# Patient Record
Sex: Male | Born: 1955 | Race: White | Hispanic: No | Marital: Single | Smoking: Current every day smoker
Health system: Southern US, Community
[De-identification: ages and names within clinical notes are randomized; demographics above are authoritative.]

## PROBLEM LIST (undated history)

## (undated) DIAGNOSIS — Z9889 Other specified postprocedural states: Secondary | ICD-10-CM

## (undated) DIAGNOSIS — J449 Chronic obstructive pulmonary disease, unspecified: Secondary | ICD-10-CM

## (undated) DIAGNOSIS — F32A Depression, unspecified: Secondary | ICD-10-CM

## (undated) DIAGNOSIS — I255 Ischemic cardiomyopathy: Secondary | ICD-10-CM

## (undated) DIAGNOSIS — R011 Cardiac murmur, unspecified: Secondary | ICD-10-CM

## (undated) DIAGNOSIS — I5042 Chronic combined systolic (congestive) and diastolic (congestive) heart failure: Secondary | ICD-10-CM

## (undated) DIAGNOSIS — I10 Essential (primary) hypertension: Secondary | ICD-10-CM

## (undated) DIAGNOSIS — Z951 Presence of aortocoronary bypass graft: Secondary | ICD-10-CM

## (undated) DIAGNOSIS — E1151 Type 2 diabetes mellitus with diabetic peripheral angiopathy without gangrene: Secondary | ICD-10-CM

## (undated) DIAGNOSIS — D649 Anemia, unspecified: Secondary | ICD-10-CM

## (undated) DIAGNOSIS — I2510 Atherosclerotic heart disease of native coronary artery without angina pectoris: Secondary | ICD-10-CM

## (undated) DIAGNOSIS — Z87442 Personal history of urinary calculi: Secondary | ICD-10-CM

## (undated) DIAGNOSIS — C801 Malignant (primary) neoplasm, unspecified: Secondary | ICD-10-CM

## (undated) DIAGNOSIS — F17200 Nicotine dependence, unspecified, uncomplicated: Secondary | ICD-10-CM

## (undated) DIAGNOSIS — Z9861 Coronary angioplasty status: Secondary | ICD-10-CM

## (undated) DIAGNOSIS — K219 Gastro-esophageal reflux disease without esophagitis: Secondary | ICD-10-CM

## (undated) DIAGNOSIS — I779 Disorder of arteries and arterioles, unspecified: Secondary | ICD-10-CM

## (undated) DIAGNOSIS — I509 Heart failure, unspecified: Secondary | ICD-10-CM

## (undated) DIAGNOSIS — E785 Hyperlipidemia, unspecified: Secondary | ICD-10-CM

## (undated) DIAGNOSIS — I214 Non-ST elevation (NSTEMI) myocardial infarction: Secondary | ICD-10-CM

## (undated) HISTORY — PX: CATARACT EXTRACTION W/ INTRAOCULAR LENS IMPLANT: SHX1309

## (undated) HISTORY — DX: Type 2 diabetes mellitus with diabetic peripheral angiopathy without gangrene: E11.51

## (undated) HISTORY — PX: EYE SURGERY: SHX253

## (undated) HISTORY — DX: Presence of aortocoronary bypass graft: Z95.1

## (undated) HISTORY — PX: TONSILLECTOMY: SUR1361

## (undated) HISTORY — PX: TRANSTHORACIC ECHOCARDIOGRAM: SHX275

## (undated) HISTORY — DX: Other specified postprocedural states: Z98.890

## (undated) HISTORY — DX: Atherosclerotic heart disease of native coronary artery without angina pectoris: I25.10

## (undated) HISTORY — DX: Coronary angioplasty status: Z98.61

## (undated) HISTORY — PX: OTHER SURGICAL HISTORY: SHX169

## (undated) HISTORY — DX: Chronic combined systolic (congestive) and diastolic (congestive) heart failure: I50.42

---

## 2000-03-15 DIAGNOSIS — I255 Ischemic cardiomyopathy: Secondary | ICD-10-CM | POA: Diagnosis present

## 2010-02-20 ENCOUNTER — Ambulatory Visit: Payer: Self-pay

## 2010-03-12 ENCOUNTER — Encounter: Payer: Self-pay | Admitting: Emergency Medicine

## 2010-03-12 ENCOUNTER — Inpatient Hospital Stay (HOSPITAL_COMMUNITY)
Admission: AD | Admit: 2010-03-12 | Discharge: 2010-03-30 | Disposition: A | Discharge status: Other Acute Inpatient Hospital | Payer: Self-pay | Source: Home / Self Care | Attending: Internal Medicine | Admitting: Internal Medicine

## 2010-03-12 ENCOUNTER — Ambulatory Visit: Payer: Self-pay | Admitting: Radiology

## 2010-03-15 ENCOUNTER — Inpatient Hospital Stay (HOSPITAL_COMMUNITY)
Admission: AD | Admit: 2010-03-15 | Discharge: 2010-03-30 | Payer: Self-pay | Source: Home / Self Care | Attending: Cardiovascular Disease | Admitting: Cardiovascular Disease

## 2010-03-15 ENCOUNTER — Ambulatory Visit: Payer: Self-pay | Admitting: Cardiothoracic Surgery

## 2010-03-15 DIAGNOSIS — Z9889 Other specified postprocedural states: Secondary | ICD-10-CM | POA: Insufficient documentation

## 2010-03-15 DIAGNOSIS — I214 Non-ST elevation (NSTEMI) myocardial infarction: Secondary | ICD-10-CM | POA: Insufficient documentation

## 2010-03-15 DIAGNOSIS — Z951 Presence of aortocoronary bypass graft: Secondary | ICD-10-CM

## 2010-03-15 DIAGNOSIS — F17200 Nicotine dependence, unspecified, uncomplicated: Secondary | ICD-10-CM | POA: Diagnosis present

## 2010-03-15 DIAGNOSIS — I2510 Atherosclerotic heart disease of native coronary artery without angina pectoris: Secondary | ICD-10-CM | POA: Diagnosis present

## 2010-03-15 DIAGNOSIS — E1169 Type 2 diabetes mellitus with other specified complication: Secondary | ICD-10-CM | POA: Diagnosis present

## 2010-03-15 DIAGNOSIS — I10 Essential (primary) hypertension: Secondary | ICD-10-CM

## 2010-03-15 HISTORY — PX: MITRAL VALVULOPLASTY: SHX143

## 2010-03-15 HISTORY — DX: Essential (primary) hypertension: I10

## 2010-03-15 HISTORY — PX: CORONARY ARTERY BYPASS GRAFT: SHX141

## 2010-03-15 HISTORY — DX: Non-ST elevation (NSTEMI) myocardial infarction: I21.4

## 2010-03-15 HISTORY — DX: Presence of aortocoronary bypass graft: Z95.1

## 2010-03-15 HISTORY — DX: Other specified postprocedural states: Z98.890

## 2010-03-16 ENCOUNTER — Encounter: Payer: Self-pay | Admitting: Cardiothoracic Surgery

## 2010-03-16 ENCOUNTER — Encounter (INDEPENDENT_AMBULATORY_CARE_PROVIDER_SITE_OTHER): Payer: Self-pay | Admitting: Cardiology

## 2010-03-23 ENCOUNTER — Encounter: Payer: Self-pay | Admitting: Cardiothoracic Surgery

## 2010-03-23 HISTORY — PX: TRANSTHORACIC ECHOCARDIOGRAM: SHX275

## 2010-04-01 ENCOUNTER — Emergency Department (HOSPITAL_COMMUNITY)
Admission: EM | Admit: 2010-04-01 | Discharge: 2010-04-02 | Payer: Self-pay | Source: Home / Self Care | Admitting: Emergency Medicine

## 2010-04-04 ENCOUNTER — Encounter
Admission: AD | Admit: 2010-04-04 | Discharge: 2010-04-04 | Payer: Self-pay | Source: Home / Self Care | Attending: Dentistry | Admitting: Dentistry

## 2010-04-18 ENCOUNTER — Ambulatory Visit
Admission: RE | Admit: 2010-04-18 | Discharge: 2010-04-18 | Payer: Self-pay | Source: Home / Self Care | Attending: Cardiothoracic Surgery | Admitting: Cardiothoracic Surgery

## 2010-04-18 ENCOUNTER — Encounter
Admission: RE | Admit: 2010-04-18 | Discharge: 2010-04-18 | Payer: Self-pay | Source: Home / Self Care | Attending: Cardiothoracic Surgery | Admitting: Cardiothoracic Surgery

## 2010-04-26 ENCOUNTER — Encounter (HOSPITAL_COMMUNITY)
Admission: RE | Admit: 2010-04-26 | Discharge: 2010-05-15 | Payer: Self-pay | Source: Home / Self Care | Attending: Cardiology | Admitting: Cardiology

## 2010-05-02 LAB — GLUCOSE, CAPILLARY
Glucose-Capillary: 150 mg/dL — ABNORMAL HIGH (ref 70–99)
Glucose-Capillary: 122 mg/dL — ABNORMAL HIGH (ref 70–99)

## 2010-05-07 LAB — GLUCOSE, CAPILLARY
Glucose-Capillary: 123 mg/dL — ABNORMAL HIGH (ref 70–99)
Glucose-Capillary: 109 mg/dL — ABNORMAL HIGH (ref 70–99)
Glucose-Capillary: 138 mg/dL — ABNORMAL HIGH (ref 70–99)

## 2010-05-09 ENCOUNTER — Ambulatory Visit
Admission: RE | Admit: 2010-05-09 | Discharge: 2010-05-09 | Payer: Self-pay | Source: Home / Self Care | Attending: Cardiothoracic Surgery | Admitting: Cardiothoracic Surgery

## 2010-05-09 ENCOUNTER — Encounter
Admission: RE | Admit: 2010-05-09 | Discharge: 2010-05-09 | Payer: Self-pay | Source: Home / Self Care | Attending: Cardiothoracic Surgery | Admitting: Cardiothoracic Surgery

## 2010-05-10 NOTE — Assessment & Plan Note (Signed)
OFFICE VISIT  PASCO, MARCHITTO DOB:  06-Aug-1955                                        May 09, 2010 CHART #:  [id number]  CURRENT PROBLEMS: 1. Status post coronary artery bypass graft x3 and mitral valve     annuloplasty repair on March 23, 2010, for acute myocardial     infarction, ischemic cardiomyopathy, and ischemic mitral     regurgitation. 2. Chronic obstructive pulmonary disease, reformed smoker. 3. Full dental extraction, pending dentures.  PRESENT ILLNESS:  The patient is a 55 year old Caucasian male who returns for further surgical followup after undergoing multivessel bypass grafting combined with mitral valve repair in early December when he presented with an acute MI, ischemic MR, and class IV CHF.  He has done well now as an outpatient and is successfully performing outpatient cardiac rehab at [facility].  He denies recurrent symptoms of angina or CHF and the surgical incisions are healing well.  He has been maintained on Coumadin from the mitral valve repair and has been on Coumadin for over 6 weeks now.  He has not had any postoperative atrial fibrillation and the Coumadin could be stopped at this time.  He remains on Coreg 12.5 mg b.i.d., Zocor 40 mg daily, aspirin 81 mg daily, and p.r.n. Vicodin.  PHYSICAL EXAMINATION:  Vital Signs:  Blood pressure 116/70, pulse 90 and regular, respirations 20, and saturation 98% on room air.  General:  He is alert and comfortable.  He appears to be doing very well.  Lungs: Breath sounds are clear and equal.  The sternum is well healed. Cardiac:  Rhythm is regular and there is no murmur.  Extremities:  The leg incisions are healed and there is no pedal edema.  DIAGNOSTIC TESTS:  A PA and lateral chest x-ray reveals clear lung fields.  No pleural effusion.  Cardiac silhouette is reduced in size and the sternal wires are intact.  IMPRESSION AND PLAN:  The patient done exceptionally well and  is functioning at a high level.  He is not ready to return to work until full 3 months after surgery due to the physical demands of his work.  He can stop the Coumadin now and proceed with having mandibular surgery to fit his mouth for dentures.  I plan on seeing him back in approximately 3 weeks to monitor his progress.  Kerin Perna, M.D. Electronically Signed  PV/MEDQ  D:  05/09/2010  T:  05/10/2010  Job:  [id number]  cc:   Landry Corporal, MD

## 2010-05-16 ENCOUNTER — Encounter (HOSPITAL_COMMUNITY): Payer: BC Managed Care – PPO | Attending: Cardiology

## 2010-05-16 DIAGNOSIS — I2589 Other forms of chronic ischemic heart disease: Secondary | ICD-10-CM | POA: Insufficient documentation

## 2010-05-16 DIAGNOSIS — I079 Rheumatic tricuspid valve disease, unspecified: Secondary | ICD-10-CM | POA: Insufficient documentation

## 2010-05-16 DIAGNOSIS — Z5189 Encounter for other specified aftercare: Secondary | ICD-10-CM | POA: Insufficient documentation

## 2010-05-16 DIAGNOSIS — I10 Essential (primary) hypertension: Secondary | ICD-10-CM | POA: Insufficient documentation

## 2010-05-16 DIAGNOSIS — E039 Hypothyroidism, unspecified: Secondary | ICD-10-CM | POA: Insufficient documentation

## 2010-05-16 DIAGNOSIS — Z951 Presence of aortocoronary bypass graft: Secondary | ICD-10-CM | POA: Insufficient documentation

## 2010-05-16 DIAGNOSIS — I2510 Atherosclerotic heart disease of native coronary artery without angina pectoris: Secondary | ICD-10-CM | POA: Insufficient documentation

## 2010-05-16 DIAGNOSIS — I509 Heart failure, unspecified: Secondary | ICD-10-CM | POA: Insufficient documentation

## 2010-05-16 DIAGNOSIS — E785 Hyperlipidemia, unspecified: Secondary | ICD-10-CM | POA: Insufficient documentation

## 2010-05-16 DIAGNOSIS — Z9889 Other specified postprocedural states: Secondary | ICD-10-CM | POA: Insufficient documentation

## 2010-05-16 DIAGNOSIS — F17200 Nicotine dependence, unspecified, uncomplicated: Secondary | ICD-10-CM | POA: Insufficient documentation

## 2010-05-16 DIAGNOSIS — J441 Chronic obstructive pulmonary disease with (acute) exacerbation: Secondary | ICD-10-CM | POA: Insufficient documentation

## 2010-05-16 DIAGNOSIS — I059 Rheumatic mitral valve disease, unspecified: Secondary | ICD-10-CM | POA: Insufficient documentation

## 2010-05-16 DIAGNOSIS — Z7982 Long term (current) use of aspirin: Secondary | ICD-10-CM | POA: Insufficient documentation

## 2010-05-16 DIAGNOSIS — E119 Type 2 diabetes mellitus without complications: Secondary | ICD-10-CM | POA: Insufficient documentation

## 2010-05-16 DIAGNOSIS — I5022 Chronic systolic (congestive) heart failure: Secondary | ICD-10-CM | POA: Insufficient documentation

## 2010-05-18 ENCOUNTER — Encounter (HOSPITAL_COMMUNITY): Payer: BC Managed Care – PPO

## 2010-05-21 ENCOUNTER — Ambulatory Visit (HOSPITAL_COMMUNITY): Payer: Self-pay | Admitting: Dentistry

## 2010-05-21 ENCOUNTER — Encounter (HOSPITAL_COMMUNITY): Payer: BC Managed Care – PPO

## 2010-05-21 DIAGNOSIS — M899 Disorder of bone, unspecified: Secondary | ICD-10-CM

## 2010-05-21 DIAGNOSIS — K08109 Complete loss of teeth, unspecified cause, unspecified class: Secondary | ICD-10-CM

## 2010-05-21 DIAGNOSIS — M949 Disorder of cartilage, unspecified: Secondary | ICD-10-CM

## 2010-05-23 ENCOUNTER — Encounter (HOSPITAL_COMMUNITY): Payer: BC Managed Care – PPO

## 2010-05-23 ENCOUNTER — Other Ambulatory Visit: Payer: Self-pay

## 2010-05-23 LAB — GLUCOSE, CAPILLARY
Glucose-Capillary: 206 mg/dL — ABNORMAL HIGH (ref 70–99)
Glucose-Capillary: 116 mg/dL — ABNORMAL HIGH (ref 70–99)

## 2010-05-24 ENCOUNTER — Ambulatory Visit (HOSPITAL_COMMUNITY): Payer: BC Managed Care – PPO | Admitting: Dentistry

## 2010-05-24 DIAGNOSIS — M949 Disorder of cartilage, unspecified: Secondary | ICD-10-CM

## 2010-05-24 DIAGNOSIS — K08109 Complete loss of teeth, unspecified cause, unspecified class: Secondary | ICD-10-CM

## 2010-05-24 DIAGNOSIS — M899 Disorder of bone, unspecified: Secondary | ICD-10-CM

## 2010-05-25 ENCOUNTER — Encounter (HOSPITAL_COMMUNITY): Payer: BC Managed Care – PPO

## 2010-05-25 ENCOUNTER — Other Ambulatory Visit: Payer: Self-pay

## 2010-05-25 LAB — GLUCOSE, CAPILLARY: Glucose-Capillary: 118 mg/dL — ABNORMAL HIGH (ref 70–99)

## 2010-05-28 ENCOUNTER — Encounter (HOSPITAL_COMMUNITY): Payer: BC Managed Care – PPO

## 2010-05-28 LAB — GLUCOSE, CAPILLARY: Glucose-Capillary: 220 mg/dL — ABNORMAL HIGH (ref 70–99)

## 2010-05-30 ENCOUNTER — Ambulatory Visit (INDEPENDENT_AMBULATORY_CARE_PROVIDER_SITE_OTHER): Payer: BC Managed Care – PPO | Admitting: Cardiothoracic Surgery

## 2010-05-30 ENCOUNTER — Encounter (HOSPITAL_COMMUNITY): Payer: BC Managed Care – PPO

## 2010-05-30 DIAGNOSIS — I2510 Atherosclerotic heart disease of native coronary artery without angina pectoris: Secondary | ICD-10-CM

## 2010-05-30 DIAGNOSIS — Z0279 Encounter for issue of other medical certificate: Secondary | ICD-10-CM

## 2010-05-31 ENCOUNTER — Ambulatory Visit (HOSPITAL_COMMUNITY): Payer: BC Managed Care – PPO | Admitting: Dentistry

## 2010-05-31 DIAGNOSIS — K08109 Complete loss of teeth, unspecified cause, unspecified class: Secondary | ICD-10-CM

## 2010-05-31 NOTE — Assessment & Plan Note (Signed)
OFFICE VISIT  Eric Bradshaw, Eric Bradshaw DOB:  July 06, 1955                                        May 31, 2010 CHART #:  [id number]  CURRENT PROBLEMS: 1. Status post CABG x3 with mitral valve repair, March 23, 2010 for     acute MI, ischemic cardiomyopathy, and mitral regurgitation. 2. Chronic obstructive pulmonary disease, reformed smoker.  PRESENT ILLNESS:  The patient is a 55 year old Caucasian male who returns for his final surgical followup 2 months after combined multivessel bypass grafting and mitral valve repair.  He is now doing well without angina or CHF.  He is off his Coumadin as he is maintained a sinus rhythm.  He is planning on getting dentures fit and resuming his work schedule in March 2012.  PHYSICAL EXAMINATION:  Blood pressure 110/80, pulse 98, saturation 92% on room air.  He is alert and appears healthy.  Breath sounds are clear. Cardiac:  Rhythm is regular without murmur or gallop.  There is no peripheral edema.  IMPRESSION:  The patient is now almost fully recovered.  He will return to his cardiologist and primary physicians and return here as needed.  Kerin Perna, M.D. Electronically Signed  PV/MEDQ  D:  05/31/2010  T:  05/31/2010  Job:  [id number]  cc:   Ritta Slot, MD

## 2010-06-01 ENCOUNTER — Encounter (HOSPITAL_COMMUNITY): Payer: BC Managed Care – PPO

## 2010-06-04 ENCOUNTER — Encounter (HOSPITAL_COMMUNITY): Payer: BC Managed Care – PPO

## 2010-06-06 ENCOUNTER — Encounter (HOSPITAL_COMMUNITY): Payer: BC Managed Care – PPO

## 2010-06-08 ENCOUNTER — Encounter (HOSPITAL_COMMUNITY): Payer: BC Managed Care – PPO

## 2010-06-11 ENCOUNTER — Encounter (HOSPITAL_COMMUNITY): Payer: BC Managed Care – PPO

## 2010-06-13 ENCOUNTER — Encounter (HOSPITAL_COMMUNITY): Payer: BC Managed Care – PPO

## 2010-06-15 ENCOUNTER — Encounter (HOSPITAL_COMMUNITY): Payer: BC Managed Care – PPO | Attending: Cardiology

## 2010-06-15 DIAGNOSIS — Z5189 Encounter for other specified aftercare: Secondary | ICD-10-CM | POA: Insufficient documentation

## 2010-06-15 DIAGNOSIS — Z7982 Long term (current) use of aspirin: Secondary | ICD-10-CM | POA: Insufficient documentation

## 2010-06-15 DIAGNOSIS — F17200 Nicotine dependence, unspecified, uncomplicated: Secondary | ICD-10-CM | POA: Insufficient documentation

## 2010-06-15 DIAGNOSIS — I2510 Atherosclerotic heart disease of native coronary artery without angina pectoris: Secondary | ICD-10-CM | POA: Insufficient documentation

## 2010-06-15 DIAGNOSIS — E119 Type 2 diabetes mellitus without complications: Secondary | ICD-10-CM | POA: Insufficient documentation

## 2010-06-15 DIAGNOSIS — I10 Essential (primary) hypertension: Secondary | ICD-10-CM | POA: Insufficient documentation

## 2010-06-15 DIAGNOSIS — I509 Heart failure, unspecified: Secondary | ICD-10-CM | POA: Insufficient documentation

## 2010-06-15 DIAGNOSIS — I5022 Chronic systolic (congestive) heart failure: Secondary | ICD-10-CM | POA: Insufficient documentation

## 2010-06-15 DIAGNOSIS — I2589 Other forms of chronic ischemic heart disease: Secondary | ICD-10-CM | POA: Insufficient documentation

## 2010-06-15 DIAGNOSIS — E039 Hypothyroidism, unspecified: Secondary | ICD-10-CM | POA: Insufficient documentation

## 2010-06-15 DIAGNOSIS — I079 Rheumatic tricuspid valve disease, unspecified: Secondary | ICD-10-CM | POA: Insufficient documentation

## 2010-06-15 DIAGNOSIS — J441 Chronic obstructive pulmonary disease with (acute) exacerbation: Secondary | ICD-10-CM | POA: Insufficient documentation

## 2010-06-15 DIAGNOSIS — E785 Hyperlipidemia, unspecified: Secondary | ICD-10-CM | POA: Insufficient documentation

## 2010-06-15 DIAGNOSIS — I059 Rheumatic mitral valve disease, unspecified: Secondary | ICD-10-CM | POA: Insufficient documentation

## 2010-06-15 DIAGNOSIS — Z951 Presence of aortocoronary bypass graft: Secondary | ICD-10-CM | POA: Insufficient documentation

## 2010-06-15 DIAGNOSIS — Z9889 Other specified postprocedural states: Secondary | ICD-10-CM | POA: Insufficient documentation

## 2010-06-18 ENCOUNTER — Encounter (HOSPITAL_COMMUNITY): Payer: BC Managed Care – PPO

## 2010-06-20 ENCOUNTER — Encounter (HOSPITAL_COMMUNITY): Payer: BC Managed Care – PPO

## 2010-06-22 ENCOUNTER — Encounter (HOSPITAL_COMMUNITY): Payer: BC Managed Care – PPO

## 2010-06-25 ENCOUNTER — Encounter (HOSPITAL_COMMUNITY): Payer: BC Managed Care – PPO

## 2010-06-25 LAB — CBC
HCT: 23.3 % — ABNORMAL LOW (ref 39.0–52.0)
HCT: 26.2 % — ABNORMAL LOW (ref 39.0–52.0)
Hemoglobin: 7.8 g/dL — ABNORMAL LOW (ref 13.0–17.0)
Hemoglobin: 8.8 g/dL — ABNORMAL LOW (ref 13.0–17.0)
Hemoglobin: 9.4 g/dL — ABNORMAL LOW (ref 13.0–17.0)
MCH: 29.5 pg (ref 26.0–34.0)
MCH: 29.6 pg (ref 26.0–34.0)
MCHC: 33.5 g/dL (ref 30.0–36.0)
MCHC: 33.6 g/dL (ref 30.0–36.0)
MCHC: 33.8 g/dL (ref 30.0–36.0)
MCV: 88.2 fL (ref 78.0–100.0)
MCV: 88.3 fL (ref 78.0–100.0)
Platelets: 229 10*3/uL (ref 150–400)
Platelets: 267 10*3/uL (ref 150–400)
RBC: 2.64 MIL/uL — ABNORMAL LOW (ref 4.22–5.81)
RBC: 2.97 MIL/uL — ABNORMAL LOW (ref 4.22–5.81)
RBC: 3.13 MIL/uL — ABNORMAL LOW (ref 4.22–5.81)
RDW: 12.7 % (ref 11.5–15.5)
RDW: 12.8 % (ref 11.5–15.5)
WBC: 10.2 10*3/uL (ref 4.0–10.5)
WBC: 8.9 10*3/uL (ref 4.0–10.5)
WBC: 9 10*3/uL (ref 4.0–10.5)

## 2010-06-25 LAB — CROSSMATCH
ABO/RH(D): O NEG
Antibody Screen: NEGATIVE
Unit division: 0

## 2010-06-25 LAB — GLUCOSE, CAPILLARY
Glucose-Capillary: 100 mg/dL — ABNORMAL HIGH (ref 70–99)
Glucose-Capillary: 123 mg/dL — ABNORMAL HIGH (ref 70–99)
Glucose-Capillary: 93 mg/dL (ref 70–99)

## 2010-06-25 LAB — BASIC METABOLIC PANEL
BUN: 23 mg/dL (ref 6–23)
CO2: 27 mEq/L (ref 19–32)
Calcium: 8.6 mg/dL (ref 8.4–10.5)
Chloride: 98 mEq/L (ref 96–112)
Creatinine, Ser: 0.78 mg/dL (ref 0.4–1.5)
GFR calc Af Amer: >60 mL/min (ref >60)
GFR calc non Af Amer: >60 mL/min (ref >60)
Glucose, Bld: 83 mg/dL (ref 70–99)
Potassium: 3.9 mEq/L (ref 3.5–5.1)
Sodium: 133 mEq/L — ABNORMAL LOW (ref 135–145)

## 2010-06-25 LAB — PROTIME-INR
INR: 2.03 — ABNORMAL HIGH (ref 0.00–1.49)
INR: 2.32 — ABNORMAL HIGH (ref 0.00–1.49)
INR: 2.81 — ABNORMAL HIGH (ref 0.00–1.49)
Prothrombin Time: 23.1 seconds — ABNORMAL HIGH (ref 11.6–15.2)
Prothrombin Time: 25.6 seconds — ABNORMAL HIGH (ref 11.6–15.2)

## 2010-06-25 LAB — BRAIN NATRIURETIC PEPTIDE: Pro B Natriuretic peptide (BNP): 294 pg/mL — ABNORMAL HIGH (ref 0.0–100.0)

## 2010-06-25 LAB — DIFFERENTIAL
Basophils Relative: 0 % (ref 0–1)
Lymphocytes Relative: 20 % (ref 12–46)
Lymphs Abs: 2.1 10*3/uL (ref 0.7–4.0)
Monocytes Relative: 9 % (ref 3–12)
Neutro Abs: 6.7 10*3/uL (ref 1.7–7.7)
Neutrophils Relative %: 65 % (ref 43–77)

## 2010-06-25 LAB — APTT: aPTT: 46 seconds — ABNORMAL HIGH (ref 24–37)

## 2010-06-26 LAB — CBC
HCT: 24 % — ABNORMAL LOW (ref 39.0–52.0)
HCT: 24.5 % — ABNORMAL LOW (ref 39.0–52.0)
HCT: 24.7 % — ABNORMAL LOW (ref 39.0–52.0)
HCT: 24.8 % — ABNORMAL LOW (ref 39.0–52.0)
HCT: 24.9 % — ABNORMAL LOW (ref 39.0–52.0)
HCT: 25.2 % — ABNORMAL LOW (ref 39.0–52.0)
HCT: 25.5 % — ABNORMAL LOW (ref 39.0–52.0)
HCT: 26.5 % — ABNORMAL LOW (ref 39.0–52.0)
HCT: 41.4 % (ref 39.0–52.0)
Hemoglobin: 13.8 g/dL (ref 13.0–17.0)
Hemoglobin: 13.8 g/dL (ref 13.0–17.0)
Hemoglobin: 8.2 g/dL — ABNORMAL LOW (ref 13.0–17.0)
Hemoglobin: 8.4 g/dL — ABNORMAL LOW (ref 13.0–17.0)
Hemoglobin: 8.4 g/dL — ABNORMAL LOW (ref 13.0–17.0)
Hemoglobin: 8.4 g/dL — ABNORMAL LOW (ref 13.0–17.0)
Hemoglobin: 8.5 g/dL — ABNORMAL LOW (ref 13.0–17.0)
Hemoglobin: 8.5 g/dL — ABNORMAL LOW (ref 13.0–17.0)
Hemoglobin: 8.6 g/dL — ABNORMAL LOW (ref 13.0–17.0)
Hemoglobin: 8.8 g/dL — ABNORMAL LOW (ref 13.0–17.0)
MCH: 29.3 pg (ref 26.0–34.0)
MCH: 29.9 pg (ref 26.0–34.0)
MCH: 30.1 pg (ref 26.0–34.0)
MCH: 30.1 pg (ref 26.0–34.0)
MCH: 30.1 pg (ref 26.0–34.0)
MCH: 30.3 pg (ref 26.0–34.0)
MCH: 30.3 pg (ref 26.0–34.0)
MCH: 30.3 pg (ref 26.0–34.0)
MCH: 30.5 pg (ref 26.0–34.0)
MCH: 30.6 pg (ref 26.0–34.0)
MCH: 30.7 pg (ref 26.0–34.0)
MCH: 31.1 pg (ref 26.0–34.0)
MCHC: 33.2 g/dL (ref 30.0–36.0)
MCHC: 33.3 g/dL (ref 30.0–36.0)
MCHC: 33.3 g/dL (ref 30.0–36.0)
MCHC: 33.3 g/dL (ref 30.0–36.0)
MCHC: 33.8 g/dL (ref 30.0–36.0)
MCHC: 33.9 g/dL (ref 30.0–36.0)
MCHC: 34 g/dL (ref 30.0–36.0)
MCHC: 34.2 g/dL (ref 30.0–36.0)
MCHC: 34.5 g/dL (ref 30.0–36.0)
MCHC: 34.5 g/dL (ref 30.0–36.0)
MCHC: 34.5 g/dL (ref 30.0–36.0)
MCHC: 34.7 g/dL (ref 30.0–36.0)
MCV: 87.6 fL (ref 78.0–100.0)
MCV: 88.3 fL (ref 78.0–100.0)
MCV: 88.5 fL (ref 78.0–100.0)
MCV: 88.8 fL (ref 78.0–100.0)
MCV: 88.9 fL (ref 78.0–100.0)
MCV: 89.2 fL (ref 78.0–100.0)
MCV: 89.5 fL (ref 78.0–100.0)
MCV: 89.7 fL (ref 78.0–100.0)
MCV: 90.2 fL (ref 78.0–100.0)
MCV: 90.4 fL (ref 78.0–100.0)
MCV: 91 fL (ref 78.0–100.0)
Platelets: 110 10*3/uL — ABNORMAL LOW (ref 150–400)
Platelets: 122 10*3/uL — ABNORMAL LOW (ref 150–400)
Platelets: 126 10*3/uL — ABNORMAL LOW (ref 150–400)
Platelets: 128 10*3/uL — ABNORMAL LOW (ref 150–400)
Platelets: 150 10*3/uL (ref 150–400)
Platelets: 163 10*3/uL (ref 150–400)
Platelets: 184 10*3/uL (ref 150–400)
Platelets: 205 10*3/uL (ref 150–400)
Platelets: 216 10*3/uL (ref 150–400)
Platelets: 96 10*3/uL — ABNORMAL LOW (ref 150–400)
RBC: 2.73 MIL/uL — ABNORMAL LOW (ref 4.22–5.81)
RBC: 2.74 MIL/uL — ABNORMAL LOW (ref 4.22–5.81)
RBC: 2.77 MIL/uL — ABNORMAL LOW (ref 4.22–5.81)
RBC: 2.77 MIL/uL — ABNORMAL LOW (ref 4.22–5.81)
RBC: 2.77 MIL/uL — ABNORMAL LOW (ref 4.22–5.81)
RBC: 2.8 MIL/uL — ABNORMAL LOW (ref 4.22–5.81)
RBC: 2.82 MIL/uL — ABNORMAL LOW (ref 4.22–5.81)
RBC: 3 MIL/uL — ABNORMAL LOW (ref 4.22–5.81)
RBC: 4.52 MIL/uL (ref 4.22–5.81)
RBC: 4.64 MIL/uL (ref 4.22–5.81)
RDW: 12.3 % (ref 11.5–15.5)
RDW: 12.4 % (ref 11.5–15.5)
RDW: 12.4 % (ref 11.5–15.5)
RDW: 12.6 % (ref 11.5–15.5)
RDW: 12.6 % (ref 11.5–15.5)
RDW: 12.6 % (ref 11.5–15.5)
RDW: 12.6 % (ref 11.5–15.5)
RDW: 12.6 % (ref 11.5–15.5)
RDW: 12.7 % (ref 11.5–15.5)
RDW: 12.7 % (ref 11.5–15.5)
WBC: 13.9 10*3/uL — ABNORMAL HIGH (ref 4.0–10.5)
WBC: 17.7 10*3/uL — ABNORMAL HIGH (ref 4.0–10.5)
WBC: 17.9 10*3/uL — ABNORMAL HIGH (ref 4.0–10.5)
WBC: 18.4 10*3/uL — ABNORMAL HIGH (ref 4.0–10.5)
WBC: 19.9 10*3/uL — ABNORMAL HIGH (ref 4.0–10.5)
WBC: 20.7 10*3/uL — ABNORMAL HIGH (ref 4.0–10.5)
WBC: 23 10*3/uL — ABNORMAL HIGH (ref 4.0–10.5)
WBC: 9.8 10*3/uL (ref 4.0–10.5)

## 2010-06-26 LAB — POCT I-STAT 3, ART BLOOD GAS (G3+)
Acid-Base Excess: 1 mmol/L (ref 0.0–2.0)
Acid-Base Excess: 4 mmol/L — ABNORMAL HIGH (ref 0.0–2.0)
Acid-base deficit: 1 mmol/L (ref 0.0–2.0)
Acid-base deficit: 3 mmol/L — ABNORMAL HIGH (ref 0.0–2.0)
Bicarbonate: 22.9 mEq/L (ref 20.0–24.0)
Bicarbonate: 23.5 mEq/L (ref 20.0–24.0)
Bicarbonate: 24.7 mEq/L — ABNORMAL HIGH (ref 20.0–24.0)
Bicarbonate: 27.3 mEq/L — ABNORMAL HIGH (ref 20.0–24.0)
Bicarbonate: 29.1 mEq/L — ABNORMAL HIGH (ref 20.0–24.0)
O2 Saturation: 100 %
O2 Saturation: 75 %
O2 Saturation: 99 %
Patient temperature: 36.2
Patient temperature: 37.9
TCO2: 25 mmol/L (ref 0–100)
TCO2: 29 mmol/L (ref 0–100)
TCO2: 31 mmol/L (ref 0–100)
pCO2 arterial: 42.7 mmHg (ref 35.0–45.0)
pCO2 arterial: 48.4 mmHg — ABNORMAL HIGH (ref 35.0–45.0)
pCO2 arterial: 58.9 mmHg (ref 35.0–45.0)
pH, Arterial: 7.27 — ABNORMAL LOW (ref 7.350–7.450)
pH, Arterial: 7.337 — ABNORMAL LOW (ref 7.350–7.450)
pH, Arterial: 7.34 — ABNORMAL LOW (ref 7.350–7.450)
pH, Arterial: 7.372 (ref 7.350–7.450)
pH, Arterial: 7.419 (ref 7.350–7.450)
pO2, Arterial: 126 mmHg — ABNORMAL HIGH (ref 80.0–100.0)
pO2, Arterial: 149 mmHg — ABNORMAL HIGH (ref 80.0–100.0)
pO2, Arterial: 290 mmHg — ABNORMAL HIGH (ref 80.0–100.0)
pO2, Arterial: 43 mmHg — ABNORMAL LOW (ref 80.0–100.0)

## 2010-06-26 LAB — CREATININE, SERUM
Creatinine, Ser: 0.66 mg/dL (ref 0.4–1.5)
Creatinine, Ser: 0.7 mg/dL (ref 0.4–1.5)
GFR calc Af Amer: >60 mL/min (ref >60)
GFR calc Af Amer: >60 mL/min (ref >60)
GFR calc non Af Amer: >60 mL/min (ref >60)
GFR calc non Af Amer: >60 mL/min (ref >60)

## 2010-06-26 LAB — GLUCOSE, CAPILLARY
Glucose-Capillary: 100 mg/dL — ABNORMAL HIGH (ref 70–99)
Glucose-Capillary: 102 mg/dL — ABNORMAL HIGH (ref 70–99)
Glucose-Capillary: 110 mg/dL — ABNORMAL HIGH (ref 70–99)
Glucose-Capillary: 123 mg/dL — ABNORMAL HIGH (ref 70–99)
Glucose-Capillary: 127 mg/dL — ABNORMAL HIGH (ref 70–99)
Glucose-Capillary: 130 mg/dL — ABNORMAL HIGH (ref 70–99)
Glucose-Capillary: 135 mg/dL — ABNORMAL HIGH (ref 70–99)
Glucose-Capillary: 136 mg/dL — ABNORMAL HIGH (ref 70–99)
Glucose-Capillary: 148 mg/dL — ABNORMAL HIGH (ref 70–99)
Glucose-Capillary: 163 mg/dL — ABNORMAL HIGH (ref 70–99)
Glucose-Capillary: 170 mg/dL — ABNORMAL HIGH (ref 70–99)
Glucose-Capillary: 181 mg/dL — ABNORMAL HIGH (ref 70–99)
Glucose-Capillary: 185 mg/dL — ABNORMAL HIGH (ref 70–99)
Glucose-Capillary: 193 mg/dL — ABNORMAL HIGH (ref 70–99)
Glucose-Capillary: 197 mg/dL — ABNORMAL HIGH (ref 70–99)
Glucose-Capillary: 226 mg/dL — ABNORMAL HIGH (ref 70–99)
Glucose-Capillary: 60 mg/dL — ABNORMAL LOW (ref 70–99)
Glucose-Capillary: 74 mg/dL (ref 70–99)
Glucose-Capillary: 81 mg/dL (ref 70–99)
Glucose-Capillary: 89 mg/dL (ref 70–99)
Glucose-Capillary: 92 mg/dL (ref 70–99)
Glucose-Capillary: 98 mg/dL (ref 70–99)
Glucose-Capillary: 99 mg/dL (ref 70–99)

## 2010-06-26 LAB — BASIC METABOLIC PANEL
BUN: 10 mg/dL (ref 6–23)
BUN: 12 mg/dL (ref 6–23)
BUN: 14 mg/dL (ref 6–23)
BUN: 15 mg/dL (ref 6–23)
BUN: 18 mg/dL (ref 6–23)
BUN: 29 mg/dL — ABNORMAL HIGH (ref 6–23)
CO2: 26 mEq/L (ref 19–32)
CO2: 27 mEq/L (ref 19–32)
CO2: 28 mEq/L (ref 19–32)
CO2: 29 mEq/L (ref 19–32)
CO2: 29 mEq/L (ref 19–32)
CO2: 29 mEq/L (ref 19–32)
CO2: 30 mEq/L (ref 19–32)
CO2: 32 mEq/L (ref 19–32)
CO2: 33 mEq/L — ABNORMAL HIGH (ref 19–32)
Calcium: 7.8 mg/dL — ABNORMAL LOW (ref 8.4–10.5)
Calcium: 8.1 mg/dL — ABNORMAL LOW (ref 8.4–10.5)
Calcium: 8.5 mg/dL (ref 8.4–10.5)
Calcium: 8.6 mg/dL (ref 8.4–10.5)
Calcium: 8.7 mg/dL (ref 8.4–10.5)
Chloride: 100 mEq/L (ref 96–112)
Chloride: 101 mEq/L (ref 96–112)
Chloride: 102 mEq/L (ref 96–112)
Chloride: 106 mEq/L (ref 96–112)
Chloride: 110 mEq/L (ref 96–112)
Chloride: 94 mEq/L — ABNORMAL LOW (ref 96–112)
Chloride: 97 mEq/L (ref 96–112)
Chloride: 98 mEq/L (ref 96–112)
Creatinine, Ser: 0.63 mg/dL (ref 0.4–1.5)
Creatinine, Ser: 0.69 mg/dL (ref 0.4–1.5)
Creatinine, Ser: 0.73 mg/dL (ref 0.4–1.5)
Creatinine, Ser: 0.76 mg/dL (ref 0.4–1.5)
Creatinine, Ser: 0.78 mg/dL (ref 0.4–1.5)
Creatinine, Ser: 0.81 mg/dL (ref 0.4–1.5)
Creatinine, Ser: 0.82 mg/dL (ref 0.4–1.5)
GFR calc Af Amer: >60 mL/min (ref >60)
GFR calc Af Amer: >60 mL/min (ref >60)
GFR calc Af Amer: >60 mL/min (ref >60)
GFR calc Af Amer: >60 mL/min (ref >60)
GFR calc Af Amer: >60 mL/min (ref >60)
GFR calc Af Amer: >60 mL/min (ref >60)
GFR calc non Af Amer: >60 mL/min (ref >60)
GFR calc non Af Amer: >60 mL/min (ref >60)
GFR calc non Af Amer: >60 mL/min (ref >60)
GFR calc non Af Amer: >60 mL/min (ref >60)
Glucose, Bld: 109 mg/dL — ABNORMAL HIGH (ref 70–99)
Glucose, Bld: 109 mg/dL — ABNORMAL HIGH (ref 70–99)
Glucose, Bld: 117 mg/dL — ABNORMAL HIGH (ref 70–99)
Glucose, Bld: 126 mg/dL — ABNORMAL HIGH (ref 70–99)
Glucose, Bld: 134 mg/dL — ABNORMAL HIGH (ref 70–99)
Glucose, Bld: 140 mg/dL — ABNORMAL HIGH (ref 70–99)
Glucose, Bld: 77 mg/dL (ref 70–99)
Glucose, Bld: 87 mg/dL (ref 70–99)
Potassium: 3.2 mEq/L — ABNORMAL LOW (ref 3.5–5.1)
Potassium: 3.6 mEq/L (ref 3.5–5.1)
Potassium: 3.8 mEq/L (ref 3.5–5.1)
Potassium: 4 mEq/L (ref 3.5–5.1)
Potassium: 4.2 mEq/L (ref 3.5–5.1)
Sodium: 133 mEq/L — ABNORMAL LOW (ref 135–145)
Sodium: 134 mEq/L — ABNORMAL LOW (ref 135–145)
Sodium: 138 mEq/L (ref 135–145)
Sodium: 138 mEq/L (ref 135–145)
Sodium: 139 mEq/L (ref 135–145)
Sodium: 139 mEq/L (ref 135–145)

## 2010-06-26 LAB — BRAIN NATRIURETIC PEPTIDE
Pro B Natriuretic peptide (BNP): 220 pg/mL — ABNORMAL HIGH (ref 0.0–100.0)
Pro B Natriuretic peptide (BNP): 331 pg/mL — ABNORMAL HIGH (ref 0.0–100.0)
Pro B Natriuretic peptide (BNP): 437 pg/mL — ABNORMAL HIGH (ref 0.0–100.0)

## 2010-06-26 LAB — BLOOD GAS, ARTERIAL
Acid-Base Excess: 4 mmol/L — ABNORMAL HIGH (ref 0.0–2.0)
Bicarbonate: 27.7 mEq/L — ABNORMAL HIGH (ref 20.0–24.0)
Drawn by: 29925
Drawn by: 330981
FIO2: 0.21 %
O2 Saturation: 95.9 %
Patient temperature: 97.5
TCO2: 29 mmol/L (ref 0–100)
TCO2: 30.4 mmol/L (ref 0–100)
pCO2 arterial: 38.4 mmHg (ref 35.0–45.0)
pCO2 arterial: 42.9 mmHg (ref 35.0–45.0)
pH, Arterial: 7.447 (ref 7.350–7.450)
pH, Arterial: 7.47 — ABNORMAL HIGH (ref 7.350–7.450)
pO2, Arterial: 70.3 mmHg — ABNORMAL LOW (ref 80.0–100.0)
pO2, Arterial: 76.5 mmHg — ABNORMAL LOW (ref 80.0–100.0)

## 2010-06-26 LAB — PROTIME-INR
INR: 1.31 (ref 0.00–1.49)
INR: 1.44 (ref 0.00–1.49)
INR: 1.53 — ABNORMAL HIGH (ref 0.00–1.49)
INR: 2.3 — ABNORMAL HIGH (ref 0.00–1.49)
Prothrombin Time: 14.4 seconds (ref 11.6–15.2)
Prothrombin Time: 16.5 seconds — ABNORMAL HIGH (ref 11.6–15.2)
Prothrombin Time: 17.7 seconds — ABNORMAL HIGH (ref 11.6–15.2)
Prothrombin Time: 18.6 seconds — ABNORMAL HIGH (ref 11.6–15.2)
Prothrombin Time: 25.4 seconds — ABNORMAL HIGH (ref 11.6–15.2)

## 2010-06-26 LAB — COMPREHENSIVE METABOLIC PANEL
ALT: 27 U/L (ref 0–53)
AST: 27 U/L (ref 0–37)
Albumin: 2.7 g/dL — ABNORMAL LOW (ref 3.5–5.2)
Albumin: 3.7 g/dL (ref 3.5–5.2)
Alkaline Phosphatase: 72 U/L (ref 39–117)
Alkaline Phosphatase: 79 U/L (ref 39–117)
BUN: 13 mg/dL (ref 6–23)
BUN: 33 mg/dL — ABNORMAL HIGH (ref 6–23)
CO2: 32 mEq/L (ref 19–32)
Calcium: 8.5 mg/dL (ref 8.4–10.5)
Chloride: 98 mEq/L (ref 96–112)
Creatinine, Ser: 0.68 mg/dL (ref 0.4–1.5)
Creatinine, Ser: 0.92 mg/dL (ref 0.4–1.5)
GFR calc Af Amer: >60 mL/min (ref >60)
GFR calc non Af Amer: >60 mL/min (ref >60)
Glucose, Bld: 126 mg/dL — ABNORMAL HIGH (ref 70–99)
Glucose, Bld: 70 mg/dL (ref 70–99)
Potassium: 3.1 mEq/L — ABNORMAL LOW (ref 3.5–5.1)
Potassium: 3.8 mEq/L (ref 3.5–5.1)
Sodium: 136 mEq/L (ref 135–145)
Total Bilirubin: 0.7 mg/dL (ref 0.3–1.2)
Total Bilirubin: 0.8 mg/dL (ref 0.3–1.2)
Total Protein: 5.5 g/dL — ABNORMAL LOW (ref 6.0–8.3)
Total Protein: 6.2 g/dL (ref 6.0–8.3)

## 2010-06-26 LAB — POCT I-STAT 4, (NA,K, GLUC, HGB,HCT)
Glucose, Bld: 118 mg/dL — ABNORMAL HIGH (ref 70–99)
Glucose, Bld: 119 mg/dL — ABNORMAL HIGH (ref 70–99)
Glucose, Bld: 133 mg/dL — ABNORMAL HIGH (ref 70–99)
Glucose, Bld: 138 mg/dL — ABNORMAL HIGH (ref 70–99)
Glucose, Bld: 148 mg/dL — ABNORMAL HIGH (ref 70–99)
Glucose, Bld: 92 mg/dL (ref 70–99)
HCT: 20 % — ABNORMAL LOW (ref 39.0–52.0)
HCT: 24 % — ABNORMAL LOW (ref 39.0–52.0)
HCT: 26 % — ABNORMAL LOW (ref 39.0–52.0)
HCT: 26 % — ABNORMAL LOW (ref 39.0–52.0)
HCT: 29 % — ABNORMAL LOW (ref 39.0–52.0)
Hemoglobin: 6.8 g/dL — CL (ref 13.0–17.0)
Hemoglobin: 7.5 g/dL — ABNORMAL LOW (ref 13.0–17.0)
Hemoglobin: 8.8 g/dL — ABNORMAL LOW (ref 13.0–17.0)
Hemoglobin: 9.9 g/dL — ABNORMAL LOW (ref 13.0–17.0)
Potassium: 3.6 mEq/L (ref 3.5–5.1)
Potassium: 3.7 mEq/L (ref 3.5–5.1)
Potassium: 3.7 mEq/L (ref 3.5–5.1)
Potassium: 3.8 mEq/L (ref 3.5–5.1)
Sodium: 133 mEq/L — ABNORMAL LOW (ref 135–145)
Sodium: 137 mEq/L (ref 135–145)
Sodium: 140 mEq/L (ref 135–145)

## 2010-06-26 LAB — MAGNESIUM
Magnesium: 2.3 mg/dL (ref 1.5–2.5)
Magnesium: 2.4 mg/dL (ref 1.5–2.5)
Magnesium: 2.7 mg/dL — ABNORMAL HIGH (ref 1.5–2.5)

## 2010-06-26 LAB — POCT I-STAT 3, VENOUS BLOOD GAS (G3P V)
Acid-base deficit: 3 mmol/L — ABNORMAL HIGH (ref 0.0–2.0)
Bicarbonate: 22.3 mEq/L (ref 20.0–24.0)
TCO2: 24 mmol/L (ref 0–100)

## 2010-06-26 LAB — URINALYSIS, ROUTINE W REFLEX MICROSCOPIC
Glucose, UA: 250 mg/dL — AB
Protein, ur: NEGATIVE mg/dL
Urobilinogen, UA: 0.2 mg/dL (ref 0.0–1.0)

## 2010-06-26 LAB — CROSSMATCH: ABO/RH(D): O NEG

## 2010-06-26 LAB — EXPECTORATED SPUTUM ASSESSMENT W GRAM STAIN, RFLX TO RESP C

## 2010-06-26 LAB — POCT I-STAT, CHEM 8
Calcium, Ion: 1.19 mmol/L (ref 1.12–1.32)
Chloride: 107 mEq/L (ref 96–112)
Glucose, Bld: 157 mg/dL — ABNORMAL HIGH (ref 70–99)
HCT: 25 % — ABNORMAL LOW (ref 39.0–52.0)
HCT: 26 % — ABNORMAL LOW (ref 39.0–52.0)
Hemoglobin: 8.5 g/dL — ABNORMAL LOW (ref 13.0–17.0)
Hemoglobin: 8.8 g/dL — ABNORMAL LOW (ref 13.0–17.0)
Potassium: 4.6 mEq/L (ref 3.5–5.1)
Sodium: 141 mEq/L (ref 135–145)
TCO2: 27 mmol/L (ref 0–100)

## 2010-06-26 LAB — DIFFERENTIAL
Eosinophils Absolute: 0.2 10*3/uL (ref 0.0–0.7)
Monocytes Absolute: 1.7 10*3/uL — ABNORMAL HIGH (ref 0.1–1.0)
Neutrophils Relative %: 64 % (ref 43–77)

## 2010-06-26 LAB — POCT I-STAT GLUCOSE: Glucose, Bld: 135 mg/dL — ABNORMAL HIGH (ref 70–99)

## 2010-06-26 LAB — APTT
aPTT: 28 seconds (ref 24–37)
aPTT: 36 seconds (ref 24–37)

## 2010-06-26 LAB — PREPARE FRESH FROZEN PLASMA: Unit division: 0

## 2010-06-26 LAB — ABO/RH: ABO/RH(D): O NEG

## 2010-06-26 LAB — CULTURE, RESPIRATORY W GRAM STAIN

## 2010-06-26 LAB — HEMOGLOBIN AND HEMATOCRIT, BLOOD: HCT: 21 % — ABNORMAL LOW (ref 39.0–52.0)

## 2010-06-26 LAB — PREPARE PLATELETS

## 2010-06-26 LAB — PLATELET COUNT: Platelets: 99 10*3/uL — ABNORMAL LOW (ref 150–400)

## 2010-06-27 ENCOUNTER — Encounter (HOSPITAL_COMMUNITY): Payer: BC Managed Care – PPO

## 2010-06-27 LAB — COMPREHENSIVE METABOLIC PANEL
ALT: 30 U/L (ref 0–53)
AST: 46 U/L — ABNORMAL HIGH (ref 0–37)
Albumin: 4 g/dL (ref 3.5–5.2)
Alkaline Phosphatase: 75 U/L (ref 39–117)
BUN: 26 mg/dL — ABNORMAL HIGH (ref 6–23)
Chloride: 105 mEq/L (ref 96–112)
Potassium: 4.6 mEq/L (ref 3.5–5.1)
Sodium: 141 mEq/L (ref 135–145)
Total Bilirubin: 0.8 mg/dL (ref 0.3–1.2)

## 2010-06-27 LAB — POCT CARDIAC MARKERS
CKMB, poc: 3.2 ng/mL (ref 1.0–8.0)
Troponin i, poc: <0.05 ng/mL (ref 0.00–0.09)

## 2010-06-27 LAB — DIFFERENTIAL
Basophils Absolute: 0 10*3/uL (ref 0.0–0.1)
Basophils Relative: 0 % (ref 0–1)
Eosinophils Absolute: 0.2 10*3/uL (ref 0.0–0.7)
Monocytes Relative: 5 % (ref 3–12)
Neutro Abs: 7.5 10*3/uL (ref 1.7–7.7)
Neutrophils Relative %: 70 % (ref 43–77)

## 2010-06-27 LAB — GLUCOSE, CAPILLARY
Glucose-Capillary: 209 mg/dL — ABNORMAL HIGH (ref 70–99)
Glucose-Capillary: 216 mg/dL — ABNORMAL HIGH (ref 70–99)

## 2010-06-27 LAB — POCT I-STAT 3, ART BLOOD GAS (G3+)
Bicarbonate: 21.8 mEq/L (ref 20.0–24.0)
O2 Saturation: 94 %
TCO2: 23 mmol/L (ref 0–100)
pCO2 arterial: 34.3 mmHg — ABNORMAL LOW (ref 35.0–45.0)
pO2, Arterial: 71 mmHg — ABNORMAL LOW (ref 80.0–100.0)

## 2010-06-27 LAB — CBC
Hemoglobin: 13.6 g/dL (ref 13.0–17.0)
MCH: 30.5 pg (ref 26.0–34.0)
MCHC: 33.9 g/dL (ref 30.0–36.0)
MCV: 89.6 fL (ref 78.0–100.0)
Platelets: 192 10*3/uL (ref 150–400)
Platelets: 197 10*3/uL (ref 150–400)
RBC: 4.44 MIL/uL (ref 4.22–5.81)
WBC: 24.3 10*3/uL — ABNORMAL HIGH (ref 4.0–10.5)

## 2010-06-27 LAB — URINALYSIS, ROUTINE W REFLEX MICROSCOPIC
Glucose, UA: 100 mg/dL — AB
Ketones, ur: NEGATIVE mg/dL
Nitrite: NEGATIVE
Protein, ur: NEGATIVE mg/dL
Urobilinogen, UA: 0.2 mg/dL (ref 0.0–1.0)

## 2010-06-27 LAB — HEMOGLOBIN A1C: Hgb A1c MFr Bld: 7.1 % — ABNORMAL HIGH (ref <5.7)

## 2010-06-27 LAB — POCT TOXICOLOGY PANEL

## 2010-06-27 LAB — CARDIAC PANEL(CRET KIN+CKTOT+MB+TROPI)
Relative Index: 5.2 — ABNORMAL HIGH (ref 0.0–2.5)
Total CK: 124 U/L (ref 7–232)
Total CK: 130 U/L (ref 7–232)
Troponin I: 0.13 ng/mL — ABNORMAL HIGH (ref 0.00–0.06)

## 2010-06-27 LAB — BASIC METABOLIC PANEL
BUN: 18 mg/dL (ref 6–23)
CO2: 23 mEq/L (ref 19–32)
Calcium: 9 mg/dL (ref 8.4–10.5)
Chloride: 109 mEq/L (ref 96–112)
Creatinine, Ser: 0.79 mg/dL (ref 0.4–1.5)
Creatinine, Ser: 0.8 mg/dL (ref 0.4–1.5)
GFR calc Af Amer: >60 mL/min (ref >60)
GFR calc non Af Amer: >60 mL/min (ref >60)
Glucose, Bld: 243 mg/dL — ABNORMAL HIGH (ref 70–99)
Sodium: 144 mEq/L (ref 135–145)

## 2010-06-27 LAB — LIPID PANEL
Cholesterol: 180 mg/dL (ref 0–200)
HDL: 34 mg/dL — ABNORMAL LOW (ref >39)
LDL Cholesterol: 136 mg/dL — ABNORMAL HIGH (ref 0–99)
Total CHOL/HDL Ratio: 5.3 RATIO

## 2010-06-27 LAB — D-DIMER, QUANTITATIVE: D-Dimer, Quant: 1.9 ug/mL-FEU — ABNORMAL HIGH (ref 0.00–0.48)

## 2010-06-27 LAB — T4, FREE: Free T4: 1.13 ng/dL (ref 0.80–1.80)

## 2010-06-27 LAB — PROTIME-INR: Prothrombin Time: 13.8 seconds (ref 11.6–15.2)

## 2010-06-29 ENCOUNTER — Encounter (HOSPITAL_COMMUNITY): Payer: BC Managed Care – PPO

## 2010-07-02 ENCOUNTER — Encounter (HOSPITAL_COMMUNITY): Payer: BC Managed Care – PPO

## 2010-07-04 ENCOUNTER — Encounter (HOSPITAL_COMMUNITY): Payer: BC Managed Care – PPO

## 2010-07-06 ENCOUNTER — Encounter (HOSPITAL_COMMUNITY): Payer: BC Managed Care – PPO

## 2010-07-09 ENCOUNTER — Encounter (HOSPITAL_COMMUNITY): Payer: BC Managed Care – PPO

## 2010-07-11 ENCOUNTER — Encounter (HOSPITAL_COMMUNITY): Payer: BC Managed Care – PPO

## 2010-07-13 ENCOUNTER — Encounter (HOSPITAL_COMMUNITY): Payer: BC Managed Care – PPO

## 2010-07-16 ENCOUNTER — Encounter (HOSPITAL_COMMUNITY): Payer: Self-pay

## 2010-07-18 ENCOUNTER — Encounter (HOSPITAL_COMMUNITY): Payer: Self-pay

## 2010-07-20 ENCOUNTER — Encounter (HOSPITAL_COMMUNITY): Payer: Self-pay

## 2010-07-23 ENCOUNTER — Encounter (HOSPITAL_COMMUNITY): Payer: Self-pay

## 2010-07-25 ENCOUNTER — Encounter (HOSPITAL_COMMUNITY): Payer: Self-pay

## 2010-07-27 ENCOUNTER — Encounter (HOSPITAL_COMMUNITY): Payer: Self-pay

## 2010-07-30 ENCOUNTER — Encounter (HOSPITAL_COMMUNITY): Payer: Self-pay

## 2010-08-01 ENCOUNTER — Encounter (HOSPITAL_COMMUNITY): Payer: Self-pay

## 2010-08-03 ENCOUNTER — Encounter (HOSPITAL_COMMUNITY): Payer: Self-pay

## 2010-08-06 ENCOUNTER — Encounter (HOSPITAL_COMMUNITY): Payer: Self-pay

## 2010-08-28 NOTE — Assessment & Plan Note (Signed)
OFFICE VISIT   GEREMIAH, FUSSELL  DOB:  1955-10-06                                        April 20, 2010  CHART #:  [id number]   CURRENT PROBLEMS:  1. Status post coronary artery bypass grafting x3 with mitral valve      annuloplasty repair on March 23, 2010, for ischemic      cardiomyopathy, moderate mitral regurgitation, and severe coronary      disease (left internal mammary artery to left anterior descending      coronary artery, vein graft to obtuse marginal, vein graft to      posterior descending coronary artery).  2. Hypertension.  3. Diabetes mellitus type 2.  4. Chronic obstructive pulmonary disease, reformed smoker.   PRESENT ILLNESS:  The patient is a very nice 55 year old Caucasian male,  ex-smoker, who presents for his first office visit after undergoing  urgent multivessel bypass grafting and mitral valve annuloplasty repair  when he presented with an acute MI, congestive failure, and severe LV  dysfunction.  He was stabilized and then had his surgical procedure, at  which time, his EF was 25%.  He did well after surgery and maintained a  sinus rhythm.  He was sent home on Coumadin for the mitral valve repair  as well as Coreg 6.25 b.i.d., aspirin 81 mg, Crestor 20 mg, lisinopril  10 mg, and glyburide 2.5 mg b.i.d.  He also completed a short course of  Lasix, potassium, iron, and Vicodin, pain medication.  He has stopped  smoking.  He has no recurrent angina or symptoms of CHF.  He is getting  stronger.  The surgical incisions are healing well.   PHYSICAL EXAMINATION:  Blood pressure 126/80; pulse 90, sinus;  respirations 18; and saturation 99%.  Breath sounds are clear.  There is  no cardiac murmur and the sternal incision is well healed.  The leg  incision is well healed and there is no peripheral edema.   DIAGNOSTIC TESTS:  A PA and lateral chest x-ray shows stable cardiac  silhouette, no pleural effusions with his sternal wires  intact.   IMPRESSION AND PLAN:  The patient is doing well and he is walking 30  minutes a day.  He is ready to start outpatient cardiac rehab which I  have recommended.  He will be checking with Dr. Herbie Baltimore in [facility] early, but I told him for now he should  continue all his current medications.  In fact, he is not on Crestor due  to the cost and I gave him prescription for Zocor 40 mg.  Otherwise, he  knows he can resume driving and light activities, not to lift more than  20 pounds until 3 months after surgery.  I encouraged him to maintain  his tobacco abstinence and to start up with the cardiac rehab program.  I would be happy to see him back for any further surgical issues.   Kerin Perna, M.D.  Electronically Signed   PV/MEDQ  D:  04/20/2010  T:  04/20/2010  Job:  [id number]   cc:   Landry Corporal, MD  [place] Sender

## 2011-04-16 DIAGNOSIS — E1151 Type 2 diabetes mellitus with diabetic peripheral angiopathy without gangrene: Secondary | ICD-10-CM

## 2011-04-16 HISTORY — DX: Type 2 diabetes mellitus with diabetic peripheral angiopathy without gangrene: E11.51

## 2011-04-24 HISTORY — PX: NM MYOVIEW LTD: HXRAD82

## 2011-06-26 ENCOUNTER — Other Ambulatory Visit: Payer: Self-pay | Admitting: Cardiology

## 2011-06-27 ENCOUNTER — Ambulatory Visit
Admission: RE | Admit: 2011-06-27 | Discharge: 2011-06-27 | Disposition: A | Discharge status: Home / Self Care | Payer: BC Managed Care – PPO | Source: Ambulatory Visit | Attending: Cardiology | Admitting: Cardiology

## 2011-06-27 ENCOUNTER — Other Ambulatory Visit: Payer: Self-pay | Admitting: Cardiology

## 2011-06-27 DIAGNOSIS — Z01811 Encounter for preprocedural respiratory examination: Secondary | ICD-10-CM

## 2011-06-28 ENCOUNTER — Encounter (HOSPITAL_COMMUNITY): Payer: Self-pay | Admitting: Respiratory Therapy

## 2011-07-01 ENCOUNTER — Other Ambulatory Visit: Payer: Self-pay | Admitting: Cardiology

## 2011-07-03 ENCOUNTER — Ambulatory Visit (HOSPITAL_COMMUNITY)
Admission: RE | Admit: 2011-07-03 | Discharge: 2011-07-03 | Disposition: A | Discharge status: Home / Self Care | Payer: BC Managed Care – PPO | Source: Ambulatory Visit | Attending: Cardiology | Admitting: Cardiology

## 2011-07-03 ENCOUNTER — Encounter (HOSPITAL_COMMUNITY)
Admission: RE | Disposition: A | Discharge status: Home / Self Care | Payer: Self-pay | Source: Ambulatory Visit | Attending: Cardiology

## 2011-07-03 DIAGNOSIS — I2582 Chronic total occlusion of coronary artery: Secondary | ICD-10-CM | POA: Insufficient documentation

## 2011-07-03 DIAGNOSIS — I509 Heart failure, unspecified: Secondary | ICD-10-CM | POA: Insufficient documentation

## 2011-07-03 DIAGNOSIS — I2589 Other forms of chronic ischemic heart disease: Secondary | ICD-10-CM | POA: Insufficient documentation

## 2011-07-03 DIAGNOSIS — R9439 Abnormal result of other cardiovascular function study: Secondary | ICD-10-CM | POA: Insufficient documentation

## 2011-07-03 DIAGNOSIS — I2510 Atherosclerotic heart disease of native coronary artery without angina pectoris: Secondary | ICD-10-CM | POA: Insufficient documentation

## 2011-07-03 DIAGNOSIS — Z951 Presence of aortocoronary bypass graft: Secondary | ICD-10-CM | POA: Insufficient documentation

## 2011-07-03 HISTORY — PX: LEFT HEART CATHETERIZATION WITH CORONARY/GRAFT ANGIOGRAM: SHX5450

## 2011-07-03 SURGERY — LEFT HEART CATHETERIZATION WITH CORONARY/GRAFT ANGIOGRAM

## 2011-07-03 MED ORDER — HEPARIN (PORCINE) IN NACL 2-0.9 UNIT/ML-% IJ SOLN
INTRAMUSCULAR | Status: AC
  Filled 2011-07-03: qty 2000

## 2011-07-03 MED ORDER — SODIUM CHLORIDE 0.9 % IV SOLN
INTRAVENOUS | Status: DC
  Administered 2011-07-03: 08:00:00 via INTRAVENOUS

## 2011-07-03 MED ORDER — DIAZEPAM 5 MG PO TABS
5.0000 mg | ORAL_TABLET | ORAL | Status: AC
  Administered 2011-07-03: 5 mg via ORAL
  Filled 2011-07-03: qty 1

## 2011-07-03 MED ORDER — ACETAMINOPHEN 325 MG PO TABS: 650.0000 mg | ORAL_TABLET | ORAL | Status: DC | PRN

## 2011-07-03 MED ORDER — ONDANSETRON HCL 4 MG/2ML IJ SOLN: 4.0000 mg | Freq: Four times a day (QID) | INTRAMUSCULAR | Status: DC | PRN

## 2011-07-03 MED ORDER — LIDOCAINE HCL (PF) 1 % IJ SOLN
INTRAMUSCULAR | Status: AC
  Filled 2011-07-03: qty 30

## 2011-07-03 MED ORDER — NITROGLYCERIN 0.2 MG/ML ON CALL CATH LAB
INTRAVENOUS | Status: AC
  Filled 2011-07-03: qty 1

## 2011-07-03 MED ORDER — SODIUM CHLORIDE 0.9 % IV SOLN: 1.0000 mL/kg/h | INTRAVENOUS | Status: DC

## 2011-07-03 MED ORDER — FENTANYL CITRATE 0.05 MG/ML IJ SOLN
INTRAMUSCULAR | Status: AC
  Filled 2011-07-03: qty 2

## 2011-07-03 MED ORDER — MIDAZOLAM HCL 2 MG/2ML IJ SOLN
INTRAMUSCULAR | Status: AC
  Filled 2011-07-03: qty 2

## 2011-07-03 MED ORDER — MORPHINE SULFATE 4 MG/ML IJ SOLN: 1.0000 mg | INTRAMUSCULAR | Status: DC | PRN

## 2011-07-03 MED ORDER — ASPIRIN 81 MG PO TABS: 81.0000 mg | ORAL_TABLET | Freq: Every day | ORAL | Status: DC

## 2011-07-03 MED ORDER — SODIUM CHLORIDE 0.9 % IJ SOLN: 3.0000 mL | Freq: Two times a day (BID) | INTRAMUSCULAR | Status: DC

## 2011-07-03 MED ORDER — ASPIRIN 81 MG PO CHEW
324.0000 mg | CHEWABLE_TABLET | ORAL | Status: AC
  Administered 2011-07-03: 324 mg via ORAL
  Filled 2011-07-03: qty 4

## 2011-07-03 NOTE — Discharge Instructions (Signed)

## 2011-07-03 NOTE — Brief Op Note (Signed)
07/03/2011  9:47 AM  PATIENT:  Eric Bradshaw  57 y.o. male with a history of ICM - s/p 3V CABG referred for cath due to low EF & High Risk NST.  PRE-OPERATIVE DIAGNOSIS:  High Risk Myoview Stress Test  POST-OPERATIVE DIAGNOSIS:    Severe native CAD - RCA & LCx 100% proximally occluded; minimal disease noted after graft insertion.  LM - mid 30-40%  LAD - Prox - tapering tubular 60-80% lesion with 2 Diagonal branches (D2 is more dominant) that take-off from this location, multiple septal perforators also noted, after D2 the vessel takes a sharp bend & has a "flush"-occluded D2 then tapers to the LIMA insertion site  D1 - small caliber with ostial 80-90%  D2 - small to moderate caliber, Major diagnoal branch; 80% ostial, then proximal tubular 80%.  The vessel is not large in caliber, but covers large distribution -- may represent a viable PCI target based upon NST results  The distal LAD fills via a widely patent LIMA, wraps around the apex & gives homo-collaterals to what is likely D3  Native RCA injection demonstrates an A-V fistula that fills into the RA, this fills via small bridging collaterals.  SVG-RPDA - large, widely patent graft, antegrade flow into large- extensive RPDA; retorgrade flow into main RCA, fills up to RV marginal retrograde, & antegrade fills small caliber RPL that covers a large territory.  SVG-LCx: large caliber graft, widely patent; antegrade flow down major branching OM with 2 major branches; retrograde filling of AVGroove Cx that is small in caliber & give rise to 3 small OM / LPL branches.  Hemodynamics:   AoP 94/59 mmHg, mean75 mmHg  LVP: 95/14 mmHg, EDP ~15 mmHg  PROCEDURE:  Procedure(s) (LRB): LEFT HEART CATHETERIZATION WITH CORONARY/GRAFT ANGIOGRAM ()  SURGEON:  Surgeon(s) and Role:    * Marykay Lex, MD - Primary  ANESTHESIA:   local and IV sedation; 2mg  Versed, 50 Mcg Fentanyl  EBL:   < 10 ml  DEVICES: 5 Fr RFA sheath, JL4 - LCA, JR4 -  SVGs & LIMA as well as crossing AoV; No Torque -- RCA  LOCAL MEDICATIONS USED:  LIDOCAINE 16ml  DICTATION: .Note written in paper chart and Note written in EPIC  PLAN OF CARE: Discharge to home after PACU  Review images & consider staged PCI to D2/LAD after discussing with colleagues re best options.  PATIENT DISPOSITION:  PACU - hemodynamically stable.     Marykay Lex, M.D., M.S. [facility] [address] [place], [place]  [address]  [phone] Pager # [phone]  07/03/2011 10:01 AM

## 2011-07-03 NOTE — H&P (Signed)
History and Physical Interval Note:  NAME:  Eric Bradshaw   MRN: [id number] DOB:  [birth year]   ADMIT DATE: 07/03/2011   07/03/2011 8:44 AM  Eric Bradshaw is a 56 y.o. male with a history of ischemic cardiomyopathy - initially presented in Nov-Des of 2011 with severe CHF.  Noted to have MV Dz & MR --> referred for CABG x 3 (LIMA-LAD, SVG-OM, SVG-PDA with Mitral Annuloplasty).  Initial EF of 20-25% --> up to 30-35% on recent Echo.  He is doing well symptomatically, but continues to have an abnormal ECG, so he was sent for non-invasive Nuclease Stress Testing.  The test was read as High Risk with Apical, Apical lateral and distal to mid anteroseptal ischemia.  Due to his low EF and High Risk ST, he is referred for invasive cardiac testing with cardiac catheterization +/- PCI.   ALLERGIES: Allergies  Allergen Reactions  . Morphine And Related Other (See Comments)    HEADACHE    HOME MEDICATIONS: Prescriptions prior to admission  Medication Sig Dispense Refill  . aspirin 81 MG tablet Take 81 mg by mouth daily.      . B Complex-C (SUPER B COMPLEX PO) Take 1 tablet by mouth daily.      . Calcium Carbonate-Vit D-Min (CALCIUM 1200 PO) Take 1,200 mg by mouth daily.      . carvedilol (COREG) 12.5 MG tablet Take 18.75 mg by mouth 2 (two) times daily with a meal.      . cholecalciferol (VITAMIN D) 1000 UNITS tablet Take 5,000 Units by mouth daily.      . Coenzyme Q10 (CO Q-10) 100 MG CAPS Take 100 mg by mouth daily.      Marland Kitchen lisinopril (PRINIVIL,ZESTRIL) 10 MG tablet Take 10 mg by mouth daily.      . magnesium oxide (MAG-OX) 400 MG tablet Take 400 mg by mouth daily.      . Multiple Vitamins-Minerals (MULTIVITAMINS THER. Bradshaw/MINERALS) TABS Take 1 tablet by mouth daily.      . Omega-3 Fatty Acids (FISH OIL) 1200 MG CAPS Take 2,400 mg by mouth daily.      . simvastatin (ZOCOR) 40 MG tablet Take 40 mg by mouth every evening.      . vitamin C (ASCORBIC ACID) 500 MG tablet Take 1,500 mg by mouth daily.        . vitamin E (VITAMIN E) 1000 UNIT capsule Take 1,000 Units by mouth daily.        PHYSICAL EXAM:Blood pressure 106/67, pulse 81, temperature 96.9 F (36.1 C), temperature source Oral, resp. rate 18, height 5\' 6"  (1.676 m), weight 77.111 kg (170 lb), SpO2 99.00%. General appearance: alert, cooperative, appears stated age and no distress Neck: no adenopathy, no carotid bruit, no JVD, supple, symmetrical, trachea midline and thyroid not enlarged, symmetric, no tenderness/mass/nodules Lungs: clear to auscultation bilaterally and normal percussion bilaterally Heart: regular rate and rhythm, S1, S2 normal, no S3 or S4, systolic murmur: holosystolic 2/6, blowing at apex and diastolic murmur: early diastolic 1/6, rumbling at 2nd left intercostal space Abdomen: soft, non-tender; bowel sounds normal; no masses,  no organomegaly Extremities: extremities normal, atraumatic, no cyanosis or edema Pulses: 2+ and symmetric Neurologic: Grossly normal; CN II-XII grossly intact  IMPRESSION & PLAN The patients' history has been reviewed, patient examined, no change in status from most recent note -- dated 06/25/2010.  He is stable for his procedure. I have reviewed the patients' chart and labs. Questions were answered to the patient's satisfaction.  Eric MCCLAREN has presented today for surgery, with the diagnosis of chest pain The various methods of treatment have been discussed with the patient and family. After consideration of risks, benefits and other options for treatment, the patient has consented to Procedure(s):  LEFT HEART CATHETERIZATION AND CORONARY (AND GRAFT) ANGIOGRAPHY +/- AD HOC PERCUTANEOUS CORONARY INTERVENTION as a surgical intervention.   We will proceed with the planned procedure.   Eric Bradshaw [facility] [address] [place], [place]  [address]  [phone]  07/03/2011 8:44 AM

## 2011-07-06 NOTE — CV Procedure (Addendum)
[facility]     CARDIAC CATHETERIZATION REPORT Patient Name: Eric Bradshaw  MRN: [id number] Date of Birth: 09-26-1955   ADMIT DATE:  07/03/2011  Performing Cardiologist: Marykay Lex, M.D., MS Primary Physician: Raliegh Ip, MD, MD Primary Cardiologist:  Marykay Lex , M.D., MS  Procedures Performed:  Left Heart Catheterization via 5 Fr Right Common Femoral Artery access  Native Coronary Angiography  Left Internal Mammary Graft Angiography  Saphenous Vein Graft Graft Angiography  Indication(s): High-Risk Nuclear Stress Test  Known Coronary Disease with  Ischemic Cardiomyopath  status post CABG x3  History: 56 y.o. male with a history of ischemic cardiomyopathy - initially presented in Nov-Des of 2011 with severe CHF. Noted to have MV Dz & MR --> referred for CABG x 3 (LIMA-LAD, SVG-OM, SVG-PDA with Mitral Annuloplasty). Initial EF of 20-25% --> up to 30-35% on recent Echo. He is doing well symptomatically, but continues to have an abnormal ECG, so he was sent for non-invasive Nuclease Stress Testing. The test was read as High Risk with Apical, Apical lateral and distal to mid anteroseptal ischemia. Due to his low EF and High Risk ST, he is referred for invasive cardiac testing with cardiac catheterization +/- PCI.  Consent: The procedure with Risks/Benefits/Alternatives and Indications was reviewed with the patient.  All questions were answered.    Risks / Complications include, but not limited to: Death, MI, CVA/TIA, VF/VT (with defibrillation), Bradycardia (need for temporary pacer placement), contrast induced nephropathy, bleeding / bruising / hematoma / pseudoaneurysm, vascular or coronary injury (with possible emergent CT or Vascular Surgery), adverse medication reactions, infection.    The patient voiced understanding and agree to proceed.    Consent for signed by MD and patient with RN witness -- placed on chart.  Procedure: The patient  was brought to the 2nd Floor [facility] in the fasting state and prepped and draped in the usual sterile fashion for Right groin or radial) access. Sterile technique was used including antiseptics, cap, gloves, gown, hand hygiene, mask and sheet.  Skin prep: Chlorhexidine;  Time Out: Verified patient identification, verified procedure, site/side was marked, verified correct patient position, special equipment/implants available, medications/allergies/relevent history reviewed, required imaging and test results available.  Performed  The right femoral head was identified using tactile and fluoroscopic technique.  The right groin was anesthetized with 1% subcutaneous Lidocaine.  The right Common Femoral Artery was accessed using the Modified Seldinger Technique with placement of a antimicrobial bonded/coated single lumen (5 Fr) sheath.  The sheath was aspirated and flushed.    A  5 Fr  JL4 followed by a JR 4 Catheter was advanced of over a Standard J wire into the ascending Aorta and used to engage first the Left Coronary Artery followed by both Saphenous Vein Grafts.  Multiple Cineangiographic views were performed of the Left Coronary Artery System and Vein Grafts.   The JR 4 catheter was pulled back into the left left subclavian artery. He was advanced over a wire up the left subclavian artery and used to engage the Left Internal Mammary Artery Graft. Multiple Cineangiographic  views were performed.    A 5 Fr No Torque Catheter was advanced of over a the wire into the ascending Aorta, and was used to engage the Right Coronary Artery.  Multiple cineangiographic views of the Right Coronary Artery system were performed.  This catheter was is grossly valve for measurement of hemodynamics then pulled back across the Aortic Valve for  measurement of "pullback" gradient. Left Ventriculography was not performed The catheter and wire were removed completely out of the body.  The  patient was transferred to the holding area where the sheath was removed with manual pressure held for hemostasis.    The patient was transported to the PACU holding area in hemodynamic stable, chest pain-free condition.   The patient  was stable before, during and following the procedure.   Patient did tolerate procedure well. There were not complications.  EBL: < 10ml  Medications:  Premedication: 5mg   Valium,  Sedation:  2 mg IV Versed, 50 IV mcg Fentanyl  Contrast:  75 ml Omnipaque   Hemodynamics:  Central Aortic Pressure / Mean Aortic Pressure: 95/59 mmHg; 75 mmHg  LV Pressure / LV End diastolic Pressure:  95/14 mmHg; 15 mmHg  Coronary Angiographic Data:  Left Main:  Moderate caliber, bifurcates into LAD and Circumflex that is 100% occluded at the ostium  Left Anterior Descending (LAD):  Small moderate caliber vessel, with a proximal 30% lesion followed by a tubular irregular lesion that is 60-80% stenosed and involves the take off of a major and minor Diagonal Branch. There is also to major Septal Perforators just distal to the 80% stenosis. Following this there is a third Diagonal branch that is stump occluded and that terminal mid LAD is 100% occluded.  1st diagonal (D1):  Small caliber vessel which covers a very large distribution. It is approximately 40-70% stenosed with severe diffuse disease throughout. Not a very enticing PCI candidate  The distal LAD fills via a widely patent LIMA, wraps around the apex & gives homo-collaterals to what is likely D3   Circumflex (LCx):  100% occluded at the ostium; the vessel is filled retrograde via a widely patent saphenous graft to the first obtuse marginal. Flow continues retrograde to the main circumflex were occluded the proximal portion. Antegrade flow then continues down the AV groove circumflex and to a branching small caliber vessel that is diffusely disease.  OM 1: Fills antegrade via a widely patent large caliber saphenous vein  graft that anastomoses right at a branch point; both branches of our small moderate caliber with minimal luminal irregularities, inferior branch also bifurcates  Right Coronary Artery: Proximally occluded; there are bridging collaterals to the mid vessel without appears to be an AV fistula with venous filling directly into the Right Atrium.  right ventricle branch of right coronary artery: Filled via retrograde flow up the native RCA single small-caliber vessel just distal to the occlusion point  posterior descending artery: Moderate caliber vessel filled via antegrade flow from the widely patent large saphenous vein graft, minimal luminal irregularities  posterior lateral branch:  Fills antegrade after retrograde flow from PDA into a branching DL system with minimal luminal irregularities  Grafts  LIMA - LAD: Widely patent somewhat tortuous vessel; anastomoses to the distal portion of the mid LAD; downstream LAD flow demonstrates mild to moderate luminal irregularities distally   SVG - OM: Widely patent large caliber graft  SVG - RPDA: Widely patent large caliber graft  Impression:  3 out of 3 grafts patent with 100% proximal RCA occlusion, 100% ostial left circumflex occlusion and 100% mid LAD occlusion just proximal to LAD insertion.  The proximal LAD has 2 Diagonal branches with ostial/proximal disease the takeoff of a moderate calibered LAD with severe tubular 60-80% lesion. The second Diagonal branch there was extensive distribution, but isn't less than favorable PCI target. Clearly was not determined to be a CABG graft  target.  The third Diagonal branch is chronically stump occluded in the mid LAD.  Mildly elevated LVEDP with known severe Ischemic Cardiaomyopathy with EF of 30-35%.  Plan:  Standard post-cath care. He will be discharged later today.  I reviewed films with colleagues and discussed possibilities of considering PCI of the LAD did the second diagonal branch however  this is less than favorable PCI option in an asymptomatic patient despite having a high risk stress test. Unfortunately they're not any clear-cut PCI target's.  He'll followup with me the [facility] as previously scheduled.  Continued titrate cardiac medications. If he were to have anginal symptoms would consider an echo and possibly PCI of the LAD-Diagonal.   The case and results was discussed with the patient and family.  Time Spend Directly with Patient:  45 minutes  Sarely Stracener W, M.D., M.S. [facility] [address] [place], [place]  [address]  [phone]  07/03/2011 10:00 AM

## 2011-07-26 ENCOUNTER — Encounter (HOSPITAL_COMMUNITY): Payer: Self-pay | Admitting: Pharmacy Technician

## 2011-07-30 ENCOUNTER — Other Ambulatory Visit: Payer: Self-pay | Admitting: Cardiology

## 2011-07-31 ENCOUNTER — Encounter (HOSPITAL_COMMUNITY): Payer: Self-pay | Admitting: Cardiology

## 2011-07-31 NOTE — H&P (Signed)
History and Physical Interval Note:  NAME:  Eric Bradshaw   MRN: [id number] DOB:  May 25, 1955   ADMIT DATE: 08/02/2011   Eric Bradshaw is a 56 y.o. male with a history of ischemic cardiomyopathy - initially presented in Nov-Des of 2011 with severe CHF. Noted to have MV Dz & MR --> referred for CABG x 3 (LIMA-LAD, SVG-OM, SVG-PDA with Mitral Annuloplasty). Initial EF of 20-25% --> up to 30-35% on recent Echo. He is doing well symptomatically, but continues to have an abnormal ECG, so he was sent for non-invasive Nuclease Stress Testing. The test was read as High Risk with Apical, Apical lateral and distal to mid anteroseptal ischemia. Due to his low EF and High Risk ST, he was referred for invasive cardiac testing with cardiac catheterization which demonstrated patent grafts but with proximal LAD and major first diagonal lesions that could potentially explain his ischemia. At that time the decision was made to have him discussed with the patient and his long-term girlfriend at as to the pros & cons of proceeding with intervention on the proximal LAD to diagonal. I also chose to review the films with multiple colleagues to do the best course of action. He was seen in clinic this past week April 12. He does not necessarily have a angina symptoms but does have decreased exercise tolerance as well as fatigue. Also desired to try to increase his ejection fraction with the thought that potentially providing flow to ischemic region could help recruit more myocardium for improved function.   Past Medical History  Diagnosis Date  . Ischemic cardiomyopathy 03/2000    Initial EF ~2-0-25% -- EF now 30-35% by Echo 1/13; Pt currently declining AICD  . CAD (coronary artery disease), native coronary artery 03/2010    Proximal LAD ~80% & mid - subtotal occlusion , RCA occlusion, LCx occlusion -- s/p CABG x 3  (LIMA-LAD, SVG-OM, SVG-RPDA); Cath 06/2011, patent grafts with severe proximal  LAD - D1 lesions  . HTN, goal  below 130/80 03/2010  . Hyperlipidemia LDL goal < 70 03/2010  . Hyperglycemia 06/2011    Likely new Dx of Diabetes  . Tobacco dependence 03/2010    Initially quit - restarted ~mid 2012  . NSTEMI (non-ST elevated myocardial infarction) 03/2010    Complicated by Severe Acute Systolic CHF -- Class IV CHF,  . Chronic combined systolic and diastolic HF (heart failure) 03/2011    Class II, Stage C-D  . COPD (chronic obstructive pulmonary disease) 03/2010   Past Surgical History  Procedure Date  . Coronary artery bypass graft 03/2010    LIMA-LAD, SVG-OM, SVG-RPDA  . Mitral valvuloplasty 03/2010    conjuntive with CABG for Severe MR  . Left arm surgery - nos   . Tonsillectomy     Childhood    FAMHx: Family History  Problem Relation Age of Onset  . Heart failure Father 31    History of MI & severe CAD; recently deceased.    SOCHx: He lives with his long term girlfriend.  reports that he has been smoking Cigarettes.  He has been smoking about 1 pack per day. He does not have any smokeless tobacco history on file. He reports that he drinks about 1.5 ounces of alcohol per week. He reports that he does not use illicit drugs.  ALLERGIES: Allergies  Allergen Reactions  . Morphine And Related Other (See Comments)    HEADACHE    HOME MEDICATIONS: Prescriptions prior to admission  Medication Sig Dispense Refill  .  aspirin EC 81 MG tablet Take 81 mg by mouth daily.      . B Complex-C (SUPER B COMPLEX PO) Take 1 tablet by mouth daily.      . Calcium Carbonate-Vit D-Min (CALCIUM 1200 PO) Take 1,200 mg by mouth daily.      . carvedilol (COREG) 12.5 MG tablet Take 18.75 mg by mouth 2 (two) times daily with a meal.      . cholecalciferol (VITAMIN D) 1000 UNITS tablet Take 5,000 Units by mouth daily.      . Coenzyme Q10 (CO Q-10) 100 MG CAPS Take 100 mg by mouth daily.      Marland Kitchen lisinopril (PRINIVIL,ZESTRIL) 10 MG tablet Take 10 mg by mouth daily.      . magnesium oxide (MAG-OX) 400 MG tablet  Take 400 mg by mouth daily.      . Multiple Vitamins-Minerals (MULTIVITAMINS THER. W/MINERALS) TABS Take 1 tablet by mouth daily.      . Omega-3 Fatty Acids (FISH OIL) 1200 MG CAPS Take 2,400 mg by mouth daily.      . simvastatin (ZOCOR) 40 MG tablet Take 40 mg by mouth every evening.      . vitamin C (ASCORBIC ACID) 500 MG tablet Take 1,500 mg by mouth daily.      . vitamin E (VITAMIN E) 1000 UNIT capsule Take 1,000 Units by mouth daily.        PHYSICAL EXAM:Blood pressure 103/58, pulse 69, temperature 96.6 F (35.9 C), temperature source Oral, resp. rate 18, height 5\' 6"  (1.676 m), weight 77.111 kg (170 lb), SpO2 99.00%. General appearance: alert, cooperative, appears stated age and no distress Neck: no adenopathy, no carotid bruit, no JVD, supple, symmetrical, trachea midline and thyroid not enlarged, symmetric, no tenderness/mass/nodules Lungs: clear to auscultation bilaterally and normal percussion bilaterally Heart: regular rate and rhythm, S1, S2 normal, no S3 or S4, systolic murmur: holosystolic 2/6, blowing at apex, no click and no rub Abdomen: soft, non-tender; bowel sounds normal; no masses,  no organomegaly Extremities: extremities normal, atraumatic, no cyanosis or edema Pulses: 2+ and symmetric Neurologic: Grossly normal; CN II-XII grossly intact.  IMPRESSION & PLAN The patients' history has been reviewed, patient examined, no change in status from most recent note, stable for surgery. I have reviewed the patients' chart and labs. Questions were answered to the patient's satisfaction.    Eric Bradshaw has presented today for surgery, with the diagnosis of High Risk Stress Test & documented LAD & Diagonal lesions:  The various methods of treatment have been discussed with the patient and family.  Risks / Complications include, but not limited to: Death, MI, CVA/TIA, VF/VT (with defibrillation), Bradycardia (need for temporary pacer placement), contrast induced nephropathy,  bleeding / bruising / hematoma / pseudoaneurysm, vascular or coronary injury (with possible emergent CT or Vascular Surgery), adverse medication reactions, infection.     After consideration of risks, benefits and other options for treatment, the patient has consented to Procedure(s):  LEFT HEART CATHETERIZATION AND CORONARY ANGIOGRAPHY with Planned PERCUTANEOUS CORONARY INTERVENTION   as a surgical intervention.   We will proceed with the planned procedure.   Bernadean Saling W [facility] [address] [place], [place]  [address]  [phone]  08/02/2011 12:17 PM

## 2011-08-01 ENCOUNTER — Other Ambulatory Visit: Payer: Self-pay | Admitting: Cardiology

## 2011-08-02 ENCOUNTER — Encounter (HOSPITAL_COMMUNITY)
Admission: RE | Disposition: A | Discharge status: Home / Self Care | Payer: Self-pay | Source: Ambulatory Visit | Attending: Cardiology

## 2011-08-02 ENCOUNTER — Ambulatory Visit (HOSPITAL_COMMUNITY)
Admission: RE | Admit: 2011-08-02 | Discharge: 2011-08-03 | Disposition: A | Discharge status: Home / Self Care | Payer: BC Managed Care – PPO | Source: Ambulatory Visit | Attending: Cardiology | Admitting: Cardiology

## 2011-08-02 ENCOUNTER — Encounter (HOSPITAL_COMMUNITY): Payer: Self-pay | Admitting: General Practice

## 2011-08-02 DIAGNOSIS — I2589 Other forms of chronic ischemic heart disease: Secondary | ICD-10-CM | POA: Insufficient documentation

## 2011-08-02 DIAGNOSIS — I10 Essential (primary) hypertension: Secondary | ICD-10-CM | POA: Diagnosis present

## 2011-08-02 DIAGNOSIS — E1169 Type 2 diabetes mellitus with other specified complication: Secondary | ICD-10-CM | POA: Diagnosis present

## 2011-08-02 DIAGNOSIS — E785 Hyperlipidemia, unspecified: Secondary | ICD-10-CM | POA: Diagnosis present

## 2011-08-02 DIAGNOSIS — F17200 Nicotine dependence, unspecified, uncomplicated: Secondary | ICD-10-CM | POA: Diagnosis present

## 2011-08-02 DIAGNOSIS — R9439 Abnormal result of other cardiovascular function study: Secondary | ICD-10-CM | POA: Insufficient documentation

## 2011-08-02 DIAGNOSIS — I2510 Atherosclerotic heart disease of native coronary artery without angina pectoris: Secondary | ICD-10-CM | POA: Diagnosis present

## 2011-08-02 DIAGNOSIS — I255 Ischemic cardiomyopathy: Secondary | ICD-10-CM | POA: Diagnosis present

## 2011-08-02 HISTORY — DX: Hyperlipidemia, unspecified: E78.5

## 2011-08-02 HISTORY — PX: PERCUTANEOUS CORONARY STENT INTERVENTION (PCI-S): SHX5485

## 2011-08-02 HISTORY — DX: Essential (primary) hypertension: I10

## 2011-08-02 HISTORY — DX: Heart failure, unspecified: I50.9

## 2011-08-02 HISTORY — DX: Non-ST elevation (NSTEMI) myocardial infarction: I21.4

## 2011-08-02 HISTORY — DX: Nicotine dependence, unspecified, uncomplicated: F17.200

## 2011-08-02 HISTORY — DX: Chronic obstructive pulmonary disease, unspecified: J44.9

## 2011-08-02 HISTORY — DX: Ischemic cardiomyopathy: I25.5

## 2011-08-02 LAB — POCT ACTIVATED CLOTTING TIME: Activated Clotting Time: 369 seconds

## 2011-08-02 SURGERY — PERCUTANEOUS CORONARY STENT INTERVENTION (PCI-S)
Anesthesia: LOCAL

## 2011-08-02 MED ORDER — SODIUM CHLORIDE 0.9 % IV BOLUS (SEPSIS): 100.0000 mL | Freq: Once | INTRAVENOUS | Status: DC

## 2011-08-02 MED ORDER — PRASUGREL HCL 10 MG PO TABS
ORAL_TABLET | ORAL | Status: AC
  Administered 2011-08-03: 10 mg via ORAL
  Filled 2011-08-02: qty 6

## 2011-08-02 MED ORDER — ASPIRIN EC 81 MG PO TBEC
81.0000 mg | DELAYED_RELEASE_TABLET | Freq: Every day | ORAL | Status: DC
  Administered 2011-08-02 – 2011-08-03 (×2): 81 mg via ORAL
  Filled 2011-08-02 (×2): qty 1

## 2011-08-02 MED ORDER — NITROGLYCERIN 0.2 MG/ML ON CALL CATH LAB
INTRAVENOUS | Status: AC
  Filled 2011-08-02: qty 1

## 2011-08-02 MED ORDER — PRASUGREL HCL 10 MG PO TABS
10.0000 mg | ORAL_TABLET | Freq: Every day | ORAL | Status: DC
  Administered 2011-08-03: 10 mg via ORAL
  Filled 2011-08-02: qty 1

## 2011-08-02 MED ORDER — SODIUM CHLORIDE 0.9 % IJ SOLN: 3.0000 mL | Freq: Two times a day (BID) | INTRAMUSCULAR | Status: DC

## 2011-08-02 MED ORDER — VITAMIN C 500 MG PO TABS
1500.0000 mg | ORAL_TABLET | Freq: Every day | ORAL | Status: DC
  Administered 2011-08-02 – 2011-08-03 (×2): 1500 mg via ORAL
  Filled 2011-08-02 (×2): qty 3

## 2011-08-02 MED ORDER — LIDOCAINE HCL (PF) 1 % IJ SOLN
INTRAMUSCULAR | Status: AC
  Filled 2011-08-02: qty 30

## 2011-08-02 MED ORDER — DIAZEPAM 5 MG PO TABS
5.0000 mg | ORAL_TABLET | ORAL | Status: AC
  Administered 2011-08-02: 5 mg via ORAL
  Filled 2011-08-02: qty 1

## 2011-08-02 MED ORDER — SODIUM CHLORIDE 0.9 % IV SOLN
INTRAVENOUS | Status: DC
  Administered 2011-08-02: 75 mL/h via INTRAVENOUS

## 2011-08-02 MED ORDER — BIVALIRUDIN 250 MG IV SOLR
0.2500 mg/kg/h | INTRAVENOUS | Status: DC
  Filled 2011-08-02: qty 250

## 2011-08-02 MED ORDER — ZOLPIDEM TARTRATE 5 MG PO TABS: 10.0000 mg | ORAL_TABLET | Freq: Every evening | ORAL | Status: DC | PRN

## 2011-08-02 MED ORDER — LISINOPRIL 10 MG PO TABS
10.0000 mg | ORAL_TABLET | Freq: Every day | ORAL | Status: DC
  Administered 2011-08-03: 10 mg via ORAL
  Filled 2011-08-02: qty 1

## 2011-08-02 MED ORDER — ALPRAZOLAM 0.25 MG PO TABS: 0.2500 mg | ORAL_TABLET | Freq: Two times a day (BID) | ORAL | Status: DC | PRN

## 2011-08-02 MED ORDER — BIVALIRUDIN 250 MG IV SOLR
INTRAVENOUS | Status: AC
  Filled 2011-08-02: qty 250

## 2011-08-02 MED ORDER — SODIUM CHLORIDE 0.9 % IJ SOLN: 3.0000 mL | INTRAMUSCULAR | Status: DC | PRN

## 2011-08-02 MED ORDER — FENTANYL CITRATE 0.05 MG/ML IJ SOLN
INTRAMUSCULAR | Status: AC
  Filled 2011-08-02: qty 2

## 2011-08-02 MED ORDER — SODIUM CHLORIDE 0.9 % IV SOLN: 1.0000 mL/kg/h | INTRAVENOUS | Status: AC

## 2011-08-02 MED ORDER — CARVEDILOL 6.25 MG PO TABS
18.7500 mg | ORAL_TABLET | Freq: Two times a day (BID) | ORAL | Status: DC
  Administered 2011-08-02 – 2011-08-03 (×2): 18.75 mg via ORAL
  Filled 2011-08-02 (×5): qty 1

## 2011-08-02 MED ORDER — SIMVASTATIN 40 MG PO TABS
40.0000 mg | ORAL_TABLET | Freq: Every evening | ORAL | Status: DC
  Administered 2011-08-02: 40 mg via ORAL
  Filled 2011-08-02 (×2): qty 1

## 2011-08-02 MED ORDER — HEPARIN (PORCINE) IN NACL 2-0.9 UNIT/ML-% IJ SOLN
INTRAMUSCULAR | Status: AC
  Filled 2011-08-02: qty 2000

## 2011-08-02 MED ORDER — MIDAZOLAM HCL 2 MG/2ML IJ SOLN
INTRAMUSCULAR | Status: AC
  Filled 2011-08-02: qty 2

## 2011-08-02 MED ORDER — MAGNESIUM OXIDE 400 MG PO TABS
400.0000 mg | ORAL_TABLET | Freq: Every day | ORAL | Status: DC
  Administered 2011-08-02 – 2011-08-03 (×2): 400 mg via ORAL
  Filled 2011-08-02 (×2): qty 1

## 2011-08-02 MED ORDER — ACETAMINOPHEN 325 MG PO TABS
650.0000 mg | ORAL_TABLET | ORAL | Status: DC | PRN
  Administered 2011-08-02: 650 mg via ORAL
  Filled 2011-08-02: qty 2

## 2011-08-02 MED ORDER — SODIUM CHLORIDE 0.9 % IV SOLN: 250.0000 mL | INTRAVENOUS | Status: DC

## 2011-08-02 MED ORDER — ONDANSETRON HCL 4 MG/2ML IJ SOLN: 4.0000 mg | Freq: Four times a day (QID) | INTRAMUSCULAR | Status: DC | PRN

## 2011-08-02 NOTE — CV Procedure (Signed)
[facility]     CARDIAC CATHETERIZATION REPORT  NAME: Eric Bradshaw  MRN: [id number] DOB: 10-08-1955   ADMIT DATE:08/02/2011   Performing Cardiologist: Marykay Lex  Primary Physician: Raliegh Ip, MD, MD Primary Cardiologist:  [id number],JYNWG W  Procedures Performed:  Left Heart Catheterization via 6 Fr Right Radial Artery access  Left Ventriculography, (RAO) Hand Injection 10ml total contrast   200 mcg IC NTG Injection   Percutaneous Coronary Artery Intervention on Proximal LAD into Diagonal Branch #2 with a Promus Element DES 38 mm x 2.5 mm;; final diameter 2.5 mm distally, 2.75 mm mid (at Diagonal takeoff), 3.1 mm Proximal  Indication(s): Ischemic Cardiomyopathy with High Risk Myoview  Fatigue   Decreased Exercise tolerance  History: Eric Bradshaw is a 56 y.o. male with a history of ischemic cardiomyopathy - initially presented in Nov-Des of 2011 with severe CHF. Noted to have MV Dz & MR --> referred for CABG x 3 (LIMA-LAD, SVG-OM, SVG-PDA with Mitral Annuloplasty). Initial EF of 20-25% --> up to 30-35% on recent Echo. He is doing well symptomatically, but continues to have an abnormal ECG, so he was sent for non-invasive Nuclease Stress Testing. The test was read as High Risk with Apical, Apical lateral and distal to mid anteroseptal ischemia. Due to his low EF and High Risk ST, he was referred for invasive cardiac testing with cardiac catheterization which demonstrated patent grafts but with proximal LAD and major first diagonal lesions that could potentially explain his ischemia. At that time the decision was made to have him discussed with the patient and his long-term girlfriend at as to the pros & cons of proceeding with intervention on the proximal LAD to diagonal. I also chose to review the films with multiple colleagues to do the best course of action.  He was seen in clinic this past week April 12. He does not necessarily have a angina  symptoms but does have decreased exercise tolerance as well as fatigue. Also desired to try to increase his ejection fraction with the thought that potentially providing flow to ischemic region could help recruit more myocardium for improved function.  Consent: The procedure with Risks/Benefits/Alternatives and Indications was reviewed with the patient (and girlfriend).  All questions were answered.    Risks / Complications include, but not limited to: Death, MI, CVA/TIA, VF/VT (with defibrillation), Bradycardia (need for temporary pacer placement), contrast induced nephropathy, bleeding / bruising / hematoma / pseudoaneurysm, vascular or coronary injury (with possible emergent CT or Vascular Surgery), adverse medication reactions, infection.    The patient (and family) voice understanding and agree to proceed.   Consent for signed by MD and patient with RN witness -- placed on chart.  Procedure: The patient was brought to the 2nd Floor [facility] in the fasting state and prepped and draped in the usual sterile fashion for Right groin or radial access.  A modified Allen's test with plethysmography was performed on the right wrist demonstrating adequate Ulnar Artery collateral flow.    Sterile technique was used including antiseptics, cap, gloves, gown, hand hygiene, mask and sheet. Skin prep: Chlorhexidine;  Time Out: Verified patient identification, verified procedure, site/side was marked, verified correct patient position, special equipment/implants available, medications/allergies/relevent history reviewed, required imaging and test results available.  Performed  The right wrist was anesthetized with 1% subcutaneous Lidocaine.  The right radial artery was accessed using the Seldinger Technique with placement of a 6 Fr Glide Sheath. The sheath was aspirated  and flushed.  Then a total of 5 ml of standard Radial Artery Cocktail (see medications) was infused.  Radial  Cocktail: 2.5 mg Nicardipine, 400 mcg NTG, 2 ml 2% Lidocaine  A 5 Fr JR4 Catheter was advanced of over a Lexicographer J wire into the ascending Aorta.  Once the wire was in the Aortic Cusp, the catheter was exchanged for the 6 Fr XB LAD guide cather that was used to engage the Left coronary artery.  Multiple Guiding cineangiographic views of both the Left coronary artery system(s) were performed.   Hemodynamics:  Central Aortic Pressure: 98/59 mmHg;  75 mmHg  LV Pressure / LV End diastolic Pressure:  98/7 mmHg; 10 mmHg  Coronary Angiographic Data:    Left Main:  Mid ~40%  Left Anterior Descending (LAD):  Early Mid (technically mid as there is a very proximal D1 and SP1) tubular 80% lesion involving small D1 & moderate (major D2); following D2 there are 2 major septal perforators and the stump of a chronically occluded D2 then the vessel tapers to the LIMA anastomosis site with to-and-from flow from the known patent LIMA.  1st diagonal (D1):  Small caliber, minimal distribution; ostial ~90% (not worsened post LAD stent)  2nd diagonal (D2):  Moderate sized, large distribution with 3 large branches; Ostial calcified ~95%, then proximal tubular 70% tapering to a "normal appearing vessel" shortly before the first branch point.  3rd diagonal (D3):  "stump occluded"  Circumflex (LCx):  100% ostial occlusion as described  PERCUTANEOUS CORONARY INTERVENTION PROCEDURE  After reviewing the initial cineangiography images, the culprit lesion(s) was identified, and the decision was made to proceed with percutaneous coronary intervention.  The 5Fr Sheath was exchanged over a wire for a 6Fr sheath.   A weight based bolus of IV Angiomax was administered and the drip was continued until completion of the procedure.    Oral Prasugrel 60 mg was administered.  The Guide catheter(s) were advanced over a J-wire and used to engage the left Coronary Artery. Marland Kitchen An ACT of > 200 Sec was confirmed prior to  advancing the Guidewire.  Lesion #1:  Mid LAD ; Lesion #2: prox Diagonal branch #2 Pre-PCI Stenosis: 80 %, ostial - 95%, prox 70% Post-PCI Stenosis: 0 % TIMI 3 flow          TIMI 3 flow  Guide Catheter: 6Fr XB LAD     Guidewire: #1 - BMW, followed by Luge, followed by Intuition -- unable to pass into D2; Final Wire - Whisper wire  Pre-Dilitation Balloon: Emerge 2.0 mm x 15 mm  1st Inflation:  6 Atm for 34 Sec; Diag 2  2nd Inflation: 10 Atm for 45 Sec; LAD  Scout angiography revealed improved Diagonal diameter, but reduced flow in the LAD beyond the target bifurcation.  There was concer for a dissection, so the LAD was re-wired with the Luge wire without difficulty.  Following IC Nitroglycerin injection, TIMI 3 flow was restored with brisk LIMA derived to-fro-flow.   As the LAD lesion involved the ostium of the diagonal & the distal LAD is protected with a patent LIMA graft, the decision was made to attempt to cover the entire lesion sequence from early mid (prox) LAD into the proximal Diag2.  The plan was to use a single stent to avoid technical difficulties with stent overlapping with a "step-down" ins vessel diameter.   STENT: Promus Element DES 2.5 mm x 38 mm  Deployment: 12 Atm for 45 Sec; final distal diameter  in prox Diag2 = 2.5 mm    No additional inflations were attempted as the distal vessel diameter was well approximated on scout angiography.  Post-Dilitation Balloon #1: [place] Quantum Apex 2.75 mm x 20 mm (mid LAD into ostial Diag2)  1st Inflation: 10 Atm for 45 Sec; final diameter of ostial Diag2 2.70  Post-Dilitation Balloon #2: [place] Sprinter 3.0 mm x 9 mm (mid LAD only)  1st Inflation: 16 Atm for 65 Sec; final diameter in "Proximal-mid LAD" 3.1 mm  Post-deployment cineangiography with and without guidewire in place was performed in multiple orthogonal views demonstrating excellent stent apposition with brisk TIMI 3 flow flow down the Major D2 branch with a slight distal  "stepdown" but no evidence of dissection or perforation.  The LM-LAD has a "step-up", but this was present pre-stenting.  The distal LAD has more robust flow to the LIMA anastomosis site with appropriate to-fro flow demonstrating the patent LIMA graft.  The Guide Catheter was then exchanged for an Angled Pigtail Catheter that was used to oss the Aortic Valve over a wire.  LV hemodynamics were measured and a Hand Injection Left Ventriculogram was performed.  LV hemodynamics were then re-sampled, and the catheter was pulled back across the Aortic Valve for measurement of "pull-back" gradient.  The catheter was removed completely out of the body along with the [employer] J wire.  The sheath was removed in the Cath Lab with a TR band placed at 12 ml Air at 1522 (time).  Reverse Allen's test with plethysmography revealed non-occlusive hemostasis.  The patient was transported to the PACU holding area in a hemodynamically stable, pain free condition.   The patient  was stable before, during and following the procedure.   Patient did tolerate procedure well. There were not complications.  EBL: < 10 ml  Medications:  Premedication: 5 mg  Valium,   Sedation:  2 mg IV Versed, 25 IV mcg Fentanyl  Contrast:  190 ml Omnipaque (due to extreme difficulty crossing into D2 due to more severe stenosis than initially anticipated. Radial Cocktail: 1/2 dose (2.5 mg Nicardipine, 400 mcg NTG, 2 ml 2% Lidocaine)  IV Angiomax: 0.75 mg/kg bolus; 1.75 mg/kg/hr infusion during PCI  Prasugrel 60 mg PO  Impression:   Successful Stenting of sequential "Early Mid-LAD" into the Proximal "Major" Diagonal 2 with a single Promus DES 2.5 mmx 38 mm with tapering post-dilation as indicated above.  By hand injection LV-gram, LVEF appears to be ~35-40%, but there was not adequate filling.   Plan:  Monitory overnight with standard post Radial PCI care and d/c in AM  Dual Antiplatelet Therapy with ASA & Prasugrel  for a minimum of 1 year  Continue other CHF therapy.  ROV with me in clinic as scheduled.   The case and results was discussed with the patient (and Girlfriend). The case and results was not discussed with the patient's PCP. The case and results was discussed with the patient's Cardiologist.  Time Spend Directly with Patient:  120 minutes  HARDING,DAVID W, M.D., M.S. [facility] [address] [place], [place]  [address]  [phone]  08/02/2011 4:27 PM

## 2011-08-02 NOTE — Progress Notes (Signed)
TR BAND REMOVAL  LOCATION:    right radial  DEFLATED PER PROTOCOL:    yes  TIME BAND OFF / DRESSING APPLIED:    1845   SITE UPON ARRIVAL:    Level 0  SITE AFTER BAND REMOVAL:    Level 0  REVERSE ALLEN'S TEST:     positive  CIRCULATION SENSATION AND MOVEMENT:    Within Normal Limits   yes  COMMENTS:   Tolerated procedure well 

## 2011-08-03 LAB — BASIC METABOLIC PANEL
BUN: 15 mg/dL (ref 6–23)
CO2: 25 mEq/L (ref 19–32)
Calcium: 8.7 mg/dL (ref 8.4–10.5)
Chloride: 108 mEq/L (ref 96–112)
Creatinine, Ser: 0.78 mg/dL (ref 0.50–1.35)
Glucose, Bld: 114 mg/dL — ABNORMAL HIGH (ref 70–99)

## 2011-08-03 LAB — CBC
HCT: 36.7 % — ABNORMAL LOW (ref 39.0–52.0)
MCH: 30.2 pg (ref 26.0–34.0)
MCV: 88.6 fL (ref 78.0–100.0)
Platelets: 145 10*3/uL — ABNORMAL LOW (ref 150–400)
RBC: 4.14 MIL/uL — ABNORMAL LOW (ref 4.22–5.81)

## 2011-08-03 MED ORDER — PRASUGREL HCL 10 MG PO TABS: 10.0000 mg | ORAL_TABLET | Freq: Every day | ORAL | Status: DC

## 2011-08-03 MED ORDER — SODIUM CHLORIDE 0.9 % IV SOLN: Freq: Once | INTRAVENOUS | Status: DC

## 2011-08-03 MED ORDER — SODIUM CHLORIDE 0.9 % IV SOLN: INTRAVENOUS | Status: DC

## 2011-08-03 NOTE — Discharge Summary (Signed)
Physician Discharge Summary  Patient ID: Eric Bradshaw MRN: [id number] DOB/AGE: 11/05/1955 56 y.o.  Admit date: 08/02/2011 Discharge date: 08/03/2011  Admission Diagnoses: CAD  Discharge Diagnoses:  Principal Problem:  *CAD (coronary artery disease), native coronary artery - s/p CABG x 3 with prox LAD & D1 disease Active Problems:  Ischemic Cardiomyopathy - EF ~30-35%; Class II CHF  HTN, goal below 130/80  Hyperlipidemia LDL goal < 70  Hyperglycemia - Newly diagnosed; likely Diabetes Mellitus  Tobacco dependence   Discharged Condition: stable   Hospital Course:   Eric Bradshaw is a 56 y.o. male with a history of ischemic cardiomyopathy - initially presented in Nov-Des of 2011 with severe CHF. Noted to have MV Dz & MR --> referred for CABG x 3 (LIMA-LAD, SVG-OM, SVG-PDA with Mitral Annuloplasty). Initial EF of 20-25% --> up to 30-35% on recent Echo. He is doing well symptomatically, but continues to have an abnormal ECG, so he was sent for non-invasive Nuclease Stress Testing. The test was read as High Risk with Apical, Apical lateral and distal to mid anteroseptal ischemia. Due to his low EF and High Risk ST, he was referred for invasive cardiac testing with cardiac catheterization which demonstrated patent grafts but with proximal LAD and major first diagonal lesions that could potentially explain his ischemia. At that time the decision was made to have him discussed with the patient and his long-term girlfriend at as to the pros & cons of proceeding with intervention on the proximal LAD to diagonal. I also chose to review the films with multiple colleagues to do the best course of action.   He was seen in clinic this past week April 12. He does not necessarily have a angina symptoms but does have decreased exercise tolerance as well as fatigue. Also desired to try to increase his ejection fraction with the thought that potentially providing flow to ischemic region could help recruit more  myocardium for improved function.  He presented for left heart cath and PCI.  Successful Stenting of sequential "Early Mid-LAD" into the Proximal "Major" Diagonal 2 with a single Promus DES 2.5 mmx 38 mm with tapering post-dilation as indicated above. By hand injection LV-gram, LVEF appears to be ~35-40%, but there was not adequate filling.  The patient did well overnight and has been seen by Dr. Tresa Endo who feels he is stable for discharge home on ASA 81mg  and Effient 10mg  daily.  He will follow-up with Dr. Herbie Baltimore.   Consults: None  Significant Diagnostic Studies:   NAME: Eric Bradshaw MRN: [id number]  DOB: Oct 02, 1955 ADMIT DATE:08/02/2011  Performing Cardiologist: NWGNFAO,ZHYQM W  Primary Physician: Raliegh Ip, MD, MD  Primary Cardiologist: VHQIONG,EXBMW W  Procedures Performed:  Left Heart Catheterization via 6 Fr Right Radial Artery access  Left Ventriculography, (RAO) Hand Injection 10ml total contrast  200 mcg IC NTG Injection  Percutaneous Coronary Artery Intervention on Proximal LAD into Diagonal Branch #2 with a Promus Element DES 38 mm x 2.5 mm;; final diameter 2.5 mm distally, 2.75 mm mid (at Diagonal takeoff), 3.1 mm Proximal Indication(s): Ischemic Cardiomyopathy with High Risk Myoview  Fatigue  Decreased Exercise tolerance History: Eric Bradshaw is a 56 y.o. male with a history of ischemic cardiomyopathy - initially presented in Nov-Des of 2011 with severe CHF. Noted to have MV Dz & MR --> referred for CABG x 3 (LIMA-LAD, SVG-OM, SVG-PDA with Mitral Annuloplasty). Initial EF of 20-25% --> up to 30-35% on recent Echo. He is doing well symptomatically, but continues  to have an abnormal ECG, so he was sent for non-invasive Nuclease Stress Testing. The test was read as High Risk with Apical, Apical lateral and distal to mid anteroseptal ischemia. Due to his low EF and High Risk ST, he was referred for invasive cardiac testing with cardiac catheterization which demonstrated  patent grafts but with proximal LAD and major first diagonal lesions that could potentially explain his ischemia. At that time the decision was made to have him discussed with the patient and his long-term girlfriend at as to the pros & cons of proceeding with intervention on the proximal LAD to diagonal. I also chose to review the films with multiple colleagues to do the best course of action.  He was seen in clinic this past week April 12. He does not necessarily have a angina symptoms but does have decreased exercise tolerance as well as fatigue. Also desired to try to increase his ejection fraction with the thought that potentially providing flow to ischemic region could help recruit more myocardium for improved function.  Consent: The procedure with Risks/Benefits/Alternatives and Indications was reviewed with the patient (and girlfriend). All questions were answered.  Risks / Complications include, but not limited to: Death, MI, CVA/TIA, VF/VT (with defibrillation), Bradycardia (need for temporary pacer placement), contrast induced nephropathy, bleeding / bruising / hematoma / pseudoaneurysm, vascular or coronary injury (with possible emergent CT or Vascular Surgery), adverse medication reactions, infection.  The patient (and family) voice understanding and agree to proceed.  Consent for signed by MD and patient with RN witness -- placed on chart.  Procedure: The patient was brought to the 2nd Floor [facility] in the fasting state and prepped and draped in the usual sterile fashion for Right groin or radial access. A modified Allen's test with plethysmography was performed on the right wrist demonstrating adequate Ulnar Artery collateral flow.  Sterile technique was used including antiseptics, cap, gloves, gown, hand hygiene, mask and sheet.  Skin prep: Chlorhexidine;  Time Out: Verified patient identification, verified procedure, site/side was marked, verified correct  patient position, special equipment/implants available, medications/allergies/relevent history reviewed, required imaging and test results available. Performed  The right wrist was anesthetized with 1% subcutaneous Lidocaine. The right radial artery was accessed using the Seldinger Technique with placement of a 6 Fr Glide Sheath. The sheath was aspirated and flushed. Then a total of 5 ml of standard Radial Artery Cocktail (see medications) was infused. Radial Cocktail: 2.5 mg Nicardipine, 400 mcg NTG, 2 ml 2% Lidocaine  A 5 Fr JR4 Catheter was advanced of over a Lexicographer J wire into the ascending Aorta. Once the wire was in the Aortic Cusp, the catheter was exchanged for the 6 Fr XB LAD guide cather that was used to engage the Left coronary artery. Multiple Guiding cineangiographic views of both the Left coronary artery system(s) were performed.  Hemodynamics:  Central Aortic Pressure: 98/59 mmHg; 75 mmHg  LV Pressure / LV End diastolic Pressure: 98/7 mmHg; 10 mmHg  Coronary Angiographic Data:  Left Main: Mid ~40%  Left Anterior Descending (LAD): Early Mid (technically mid as there is a very proximal D1 and SP1) tubular 80% lesion involving small D1 & moderate (major D2); following D2 there are 2 major septal perforators and the stump of a chronically occluded D2 then the vessel tapers to the LIMA anastomosis site with to-and-from flow from the known patent LIMA.  1st diagonal (D1): Small caliber, minimal distribution; ostial ~90% (not worsened post LAD stent)  2nd diagonal (D2): Moderate sized, large distribution with 3 large branches; Ostial calcified ~95%, then proximal tubular 70% tapering to a "normal appearing vessel" shortly before the first branch point.  3rd diagonal (D3): "stump occluded"  Circumflex (LCx): 100% ostial occlusion as described PERCUTANEOUS CORONARY INTERVENTION PROCEDURE  After reviewing the initial cineangiography images, the culprit lesion(s) was identified, and  the decision was made to proceed with percutaneous coronary intervention.  The 5Fr Sheath was exchanged over a wire for a 6Fr sheath.  A weight based bolus of IV Angiomax was administered and the drip was continued until completion of the procedure.  Oral Prasugrel 60 mg was administered.  The Guide catheter(s) were advanced over a J-wire and used to engage the left Coronary Artery. Marland Kitchen  An ACT of > 200 Sec was confirmed prior to advancing the Guidewire.  Lesion #1: Mid LAD ; Lesion #2: prox Diagonal branch #2  Pre-PCI Stenosis: 80 %, ostial - 95%, prox 70% Post-PCI Stenosis: 0 %  TIMI 3 flow TIMI 3 flow  Guide Catheter: 6Fr XB LAD  Guidewire: #1 - BMW, followed by Luge, followed by Intuition -- unable to pass into D2; Final Wire - Whisper wire  Pre-Dilitation Balloon: Emerge 2.0 mm x 15 mm 1st Inflation: 6 Atm for 34 Sec; Diag 2  2nd Inflation: 10 Atm for 45 Sec; LAD  Scout angiography revealed improved Diagonal diameter, but reduced flow in the LAD beyond the target bifurcation. There was concer for a dissection, so the LAD was re-wired with the Luge wire without difficulty. Following IC Nitroglycerin injection, TIMI 3 flow was restored with brisk LIMA derived to-fro-flow. As the LAD lesion involved the ostium of the diagonal & the distal LAD is protected with a patent LIMA graft, the decision was made to attempt to cover the entire lesion sequence from early mid (prox) LAD into the proximal Diag2. The plan was to use a single stent to avoid technical difficulties with stent overlapping with a "step-down" ins vessel diameter.  STENT: Promus Element DES 2.5 mm x 38 mm  Deployment: 12 Atm for 45 Sec; final distal diameter in prox Diag2 = 2.5 mm  No additional inflations were attempted as the distal vessel diameter was well approximated on scout angiography.  Post-Dilitation Balloon #1: [place] Quantum Apex 2.75 mm x 20 mm (mid LAD into ostial Diag2)  1st Inflation: 10 Atm for 45 Sec; final diameter of  ostial Diag2 2.70  Post-Dilitation Balloon #2: [place] Sprinter 3.0 mm x 9 mm (mid LAD only)  1st Inflation: 16 Atm for 65 Sec; final diameter in "Proximal-mid LAD" 3.1 mm Post-deployment cineangiography with and without guidewire in place was performed in multiple orthogonal views demonstrating excellent stent apposition with brisk TIMI 3 flow flow down the Major D2 branch with a slight distal "stepdown" but no evidence of dissection or perforation. The LM-LAD has a "step-up", but this was present pre-stenting. The distal LAD has more robust flow to the LIMA anastomosis site with appropriate to-fro flow demonstrating the patent LIMA graft.  The Guide Catheter was then exchanged for an Angled Pigtail Catheter that was used to oss the Aortic Valve over a wire. LV hemodynamics were measured and a Hand Injection Left Ventriculogram was performed. LV hemodynamics were then re-sampled, and the catheter was pulled back across the Aortic Valve for measurement of "pull-back" gradient. The catheter was removed completely out of the body along with the [employer] J wire.  The sheath was removed in the Cath Lab  with a TR band placed at 12 ml Air at 1522 (time). Reverse Allen's test with plethysmography revealed non-occlusive hemostasis.  The patient was transported to the PACU holding area in a hemodynamically stable, pain free condition.  The patient was stable before, during and following the procedure.  Patient did tolerate procedure well.  There were not complications.  EBL: < 10 ml  Medications:  Premedication: 5 mg Valium,  Sedation: 2 mg IV Versed, 25 IV mcg Fentanyl  Contrast: 190 ml Omnipaque (due to extreme difficulty crossing into D2 due to more severe stenosis than initially anticipated. Radial Cocktail: 1/2 dose (2.5 mg Nicardipine, 400 mcg NTG, 2 ml 2% Lidocaine)  IV Angiomax: 0.75 mg/kg bolus; 1.75 mg/kg/hr infusion during PCI  Prasugrel 60 mg PO Impression:  Successful Stenting of  sequential "Early Mid-LAD" into the Proximal "Major" Diagonal 2 with a single Promus DES 2.5 mmx 38 mm with tapering post-dilation as indicated above.  By hand injection LV-gram, LVEF appears to be ~35-40%, but there was not adequate filling. Plan:  Monitory overnight with standard post Radial PCI care and d/c in AM  Dual Antiplatelet Therapy with ASA & Prasugrel for a minimum of 1 year  Continue other CHF therapy.  ROV with me in clinic as scheduled. The case and results was discussed with the patient (and Girlfriend).  The case and results was not discussed with the patient's PCP.  The case and results was discussed with the patient's Cardiologist.  Time Spend Directly with Patient:  120 minutes  HARDING,DAVID W, M.D., M.S.  [facility]  [address]  [place], [place] [address]  [phone]  08/02/2011  4:27 PM  Treatments:Stent to LAD  Discharge Exam: Blood pressure 122/71, pulse 72, temperature 97.8 F (36.6 C), temperature source Oral, resp. rate 17, height 5\' 6"  (1.676 m), weight 79.2 kg (174 lb 9.7 oz), SpO2 99.00%.   Disposition: 01-Home or Self Care  Discharge Orders    Future Orders Please Complete By Expires   Diet - low sodium heart healthy      Increase activity slowly      Discharge instructions      Comments:   No lifting with your right arm for two days.     Medication List  As of 08/03/2011  8:31 AM   TAKE these medications         aspirin EC 81 MG tablet   Take 81 mg by mouth daily.      CALCIUM 1200 PO   Take 1,200 mg by mouth daily.      carvedilol 12.5 MG tablet   Commonly known as: COREG   Take 18.75 mg by mouth 2 (two) times daily with a meal.      cholecalciferol 1000 UNITS tablet   Commonly known as: VITAMIN D   Take 5,000 Units by mouth daily.      Co Q-10 100 MG Caps   Take 100 mg by mouth daily.      Fish Oil 1200 MG Caps   Take 2,400 mg by mouth daily.      lisinopril 10 MG tablet    Commonly known as: PRINIVIL,ZESTRIL   Take 10 mg by mouth daily.      magnesium oxide 400 MG tablet   Commonly known as: MAG-OX   Take 400 mg by mouth daily.      multivitamins ther. w/minerals Tabs   Take 1 tablet by mouth daily.  prasugrel 10 MG Tabs   Commonly known as: EFFIENT   Take 1 tablet (10 mg total) by mouth daily.      simvastatin 40 MG tablet   Commonly known as: ZOCOR   Take 40 mg by mouth every evening.      SUPER B COMPLEX PO   Take 1 tablet by mouth daily.      vitamin C 500 MG tablet   Commonly known as: ASCORBIC ACID   Take 1,500 mg by mouth daily.      vitamin E 1000 UNIT capsule   Generic drug: vitamin E   Take 1,000 Units by mouth daily.             SignedDwana Melena 08/03/2011, 8:31 AM

## 2011-08-03 NOTE — Progress Notes (Signed)
CARDIAC REHAB PHASE I   PRE:  Rate/Rhythm: 71 SR    BP:  Supine:   Sitting: 121/71  Standing:   SaO2: 99 RA  MODE:  Ambulation: 340 ft   POST:  Rate/Rhythem: 74  BP:  Supine:   Sitting: 114/66  Standing:    SaO2: 100 RA  Pt ambulated hall steady/ind. No c/o sob or chest pain. Education done with pt and family with understanding. Refused phase II card rehab.   Rosalie Doctor

## 2011-08-03 NOTE — Progress Notes (Signed)
[facility]  Subjective: No Complaints.  Objective: Vital signs in last 24 hours: Temp:  [96.6 F (35.9 C)-98 F (36.7 C)] 97.5 F (36.4 C) (04/20 0540) Pulse Rate:  [69-79] 76  (04/20 0540) Resp:  [12-18] 12  (04/20 0540) BP: (81-112)/(43-71) 112/57 mmHg (04/20 0540) SpO2:  [98 %-100 %] 98 % (04/20 0540) Weight:  [77.111 kg (170 lb)-79.2 kg (174 lb 9.7 oz)] 79.2 kg (174 lb 9.7 oz) (04/20 0550) Last BM Date: 08/01/11  Intake/Output from previous day: 04/19 0701 - 04/20 0700 In: 451.3 [I.V.:451.3] Out: 1825 [Urine:1825] Intake/Output this shift:    Medications Current Facility-Administered Medications  Medication Dose Route Frequency Provider Last Rate Last Dose  . 0.9 %  sodium chloride infusion  1 mL/kg/hr Intravenous Continuous Marykay Lex, MD      . 0.9 %  sodium chloride infusion   Intravenous Once Nada Boozer, NP      . 0.9 %  sodium chloride infusion   Intravenous Continuous Nada Boozer, NP      . acetaminophen (TYLENOL) tablet 650 mg  650 mg Oral Q4H PRN Marykay Lex, MD   650 mg at 08/02/11 1938  . ALPRAZolam Prudy Feeler) tablet 0.25 mg  0.25 mg Oral BID PRN Peter M Swaziland, MD      . aspirin EC tablet 81 mg  81 mg Oral Daily Marykay Lex, MD   81 mg at 08/02/11 1752  . bivalirudin (ANGIOMAX) 250 MG injection           . carvedilol (COREG) tablet 18.75 mg  18.75 mg Oral BID WC Marykay Lex, MD   18.75 mg at 08/02/11 1745  . diazepam (VALIUM) tablet 5 mg  5 mg Oral On Call Marykay Lex, MD   5 mg at 08/02/11 1253  . fentaNYL (SUBLIMAZE) 0.05 MG/ML injection           . heparin 2-0.9 UNIT/ML-% infusion           . lidocaine (XYLOCAINE) 1 % injection           . lisinopril (PRINIVIL,ZESTRIL) tablet 10 mg  10 mg Oral Daily Marykay Lex, MD      . magnesium oxide (MAG-OX) tablet 400 mg  400 mg Oral Daily Marykay Lex, MD   400 mg at 08/02/11 1755  . midazolam (VERSED) 2 MG/2ML injection           . nitroGLYCERIN (NTG  ON-CALL) 0.2 mg/mL injection           . ondansetron (ZOFRAN) injection 4 mg  4 mg Intravenous Q6H PRN Marykay Lex, MD      . prasugrel (EFFIENT) 10 MG tablet           . prasugrel (EFFIENT) tablet 10 mg  10 mg Oral Daily Marykay Lex, MD      . simvastatin (ZOCOR) tablet 40 mg  40 mg Oral QPM Marykay Lex, MD   40 mg at 08/02/11 1752  . sodium chloride 0.9 % bolus 100 mL  100 mL Intravenous Once Lennette Bihari, MD      . sodium chloride 0.9 % injection 3 mL  3 mL Intravenous Q12H Marykay Lex, MD      . sodium chloride 0.9 % injection 3 mL  3 mL Intravenous PRN Marykay Lex, MD      . vitamin C (ASCORBIC ACID) tablet 1,500 mg  1,500 mg Oral Daily Piedad Climes  Herbie Baltimore, MD   1,500 mg at 08/02/11 1752  . zolpidem (AMBIEN) tablet 10 mg  10 mg Oral QHS PRN Peter M Swaziland, MD      . DISCONTD: 0.9 %  sodium chloride infusion   Intravenous Continuous Marykay Lex, MD 75 mL/hr at 08/02/11 1254 75 mL/hr at 08/02/11 1254  . DISCONTD: 0.9 %  sodium chloride infusion  250 mL Intravenous Continuous Marykay Lex, MD      . DISCONTD: bivalirudin (ANGIOMAX) 5 mg/mL in sodium chloride 0.9 % 50 mL infusion  0.25 mg/kg/hr Intravenous Continuous Marykay Lex, MD      . DISCONTD: sodium chloride 0.9 % injection 3 mL  3 mL Intravenous Q12H Marykay Lex, MD        PE: General appearance: alert, cooperative and no distress Lungs: clear to auscultation bilaterally Heart: regular rate and rhythm, S1, S2 normal, no murmur, click, rub or gallop Extremities: No LEE Pulses: Radials 2+ and symmetric Cath site: right radial-no erythema, ecchymosis.  Lab Results:   [facility] 08/03/11 0354  WBC 10.3  HGB 12.5*  HCT 36.7*  PLT 145*   BMET  Basename 08/03/11 0354  NA 140  K 3.9  CL 108  CO2 25  GLUCOSE 114*  BUN 15  CREATININE 0.78  CALCIUM 8.7   Studies/Results: Hemodynamics:  Central Aortic Pressure: 98/59 mmHg; 75 mmHg  LV Pressure / LV End diastolic Pressure: 98/7 mmHg; 10 mmHg    Coronary Angiographic Data:  Left Main: Mid ~40%  Left Anterior Descending (LAD): Early Mid (technically mid as there is a very proximal D1 and SP1) tubular 80% lesion involving small D1 & moderate (major D2); following D2 there are 2 major septal perforators and the stump of a chronically occluded D2 then the vessel tapers to the LIMA anastomosis site with to-and-from flow from the known patent LIMA.  1st diagonal (D1): Small caliber, minimal distribution; ostial ~90% (not worsened post LAD stent)  2nd diagonal (D2): Moderate sized, large distribution with 3 large branches; Ostial calcified ~95%, then proximal tubular 70% tapering to a "normal appearing vessel" shortly before the first branch point.  3rd diagonal (D3): "stump occluded"  Circumflex (LCx): 100% ostial occlusion as described PERCUTANEOUS CORONARY INTERVENTION PROCEDURE  After reviewing the initial cineangiography images, the culprit lesion(s) was identified, and the decision was made to proceed with percutaneous coronary intervention.  The 5Fr Sheath was exchanged over a wire for a 6Fr sheath.  A weight based bolus of IV Angiomax was administered and the drip was continued until completion of the procedure.  Oral Prasugrel 60 mg was administered.  The Guide catheter(s) were advanced over a J-wire and used to engage the left Coronary Artery. Marland Kitchen  An ACT of > 200 Sec was confirmed prior to advancing the Guidewire.  Lesion #1: Mid LAD ; Lesion #2: prox Diagonal branch #2  Pre-PCI Stenosis: 80 %, ostial - 95%, prox 70% Post-PCI Stenosis: 0 %  TIMI 3 flow TIMI 3 flow  Guide Catheter: 6Fr XB LAD  Guidewire: #1 - BMW, followed by Luge, followed by Intuition -- unable to pass into D2; Final Wire - Whisper wire  Pre-Dilitation Balloon: Emerge 2.0 mm x 15 mm 1st Inflation: 6 Atm for 34 Sec; Diag 2  2nd Inflation: 10 Atm for 45 Sec; LAD  Scout angiography revealed improved Diagonal diameter, but reduced flow in the LAD beyond the target  bifurcation. There was concer for a dissection, so the LAD was re-wired with the Luge  wire without difficulty. Following IC Nitroglycerin injection, TIMI 3 flow was restored with brisk LIMA derived to-fro-flow. As the LAD lesion involved the ostium of the diagonal & the distal LAD is protected with a patent LIMA graft, the decision was made to attempt to cover the entire lesion sequence from early mid (prox) LAD into the proximal Diag2. The plan was to use a single stent to avoid technical difficulties with stent overlapping with a "step-down" ins vessel diameter.  STENT: Promus Element DES 2.5 mm x 38 mm  Deployment: 12 Atm for 45 Sec; final distal diameter in prox Diag2 = 2.5 mm  No additional inflations were attempted as the distal vessel diameter was well approximated on scout angiography.  Post-Dilitation Balloon #1: [place] Quantum Apex 2.75 mm x 20 mm (mid LAD into ostial Diag2)  1st Inflation: 10 Atm for 45 Sec; final diameter of ostial Diag2 2.70  Post-Dilitation Balloon #2: [place] Sprinter 3.0 mm x 9 mm (mid LAD only)  1st Inflation: 16 Atm for 65 Sec; final diameter in "Proximal-mid LAD" 3.1 mm Post-deployment cineangiography with and without guidewire in place was performed in multiple orthogonal views demonstrating excellent stent apposition with brisk TIMI 3 flow flow down the Major D2 branch with a slight distal "stepdown" but no evidence of dissection or perforation. The LM-LAD has a "step-up", but this was present pre-stenting. The distal LAD has more robust flow to the LIMA anastomosis site with appropriate to-fro flow demonstrating the patent LIMA graft.  The Guide Catheter was then exchanged for an Angled Pigtail Catheter that was used to oss the Aortic Valve over a wire. LV hemodynamics were measured and a Hand Injection Left Ventriculogram was performed. LV hemodynamics were then re-sampled, and the catheter was pulled back across the Aortic Valve for measurement of "pull-back" gradient. The  catheter was removed completely out of the body along with the [employer] J wire.  The sheath was removed in the Cath Lab with a TR band placed at 12 ml Air at 1522 (time). Reverse Allen's test with plethysmography revealed non-occlusive hemostasis.  The patient was transported to the PACU holding area in a hemodynamically stable, pain free condition.  The patient was stable before, during and following the procedure.  Patient did tolerate procedure well.  There were not complications.  EBL: < 10 ml  Medications:  Premedication: 5 mg Valium,  Sedation: 2 mg IV Versed, 25 IV mcg Fentanyl  Contrast: 190 ml Omnipaque (due to extreme difficulty crossing into D2 due to more severe stenosis than initially anticipated. Radial Cocktail: 1/2 dose (2.5 mg Nicardipine, 400 mcg NTG, 2 ml 2% Lidocaine)  IV Angiomax: 0.75 mg/kg bolus; 1.75 mg/kg/hr infusion during PCI  Prasugrel 60 mg PO Impression:  Successful Stenting of sequential "Early Mid-LAD" into the Proximal "Major" Diagonal 2 with a single Promus DES 2.5 mmx 38 mm with tapering post-dilation as indicated above.  By hand injection LV-gram, LVEF appears to be ~35-40%, but there was not adequate filling. Plan:  Monitory overnight with standard post Radial PCI care and d/c in AM  Dual Antiplatelet Therapy with ASA & Prasugrel for a minimum of 1 year  Continue other CHF therapy.  ROV with me in clinic as scheduled. The case and results was discussed with the patient (and Girlfriend).  The case and results was not discussed with the patient's PCP.  The case and results was discussed with the patient's Cardiologist.  Time Spend Directly with Patient:  120 minutes  HARDING,DAVID W,  M.D., M.S.  [facility]  [address]  [place], [place] [address]  [phone]  08/02/2011  4:27 PM   Assessment/Plan  Principal Problem:  *CAD (coronary artery disease), native coronary artery - s/p CABG x 3 with  prox LAD & D1 disease Active Problems:  Ischemic Cardiomyopathy - EF ~30-35%; Class II CHF  HTN, goal below 130/80  Hyperlipidemia LDL goal < 70  Hyperglycemia - Newly diagnosed; likely Diabetes Mellitus  Tobacco dependence  Plan:  S/P left heart cath via right radial.  Successful Stenting of sequential "Early Mid-LAD" into the Proximal "Major" Diagonal 2 with a single Promus DES 2.5 mmx 38 mm with tapering post-dilation as indicated above. by hand injection LV-gram, LVEF appears to be ~35-40%, but there was not adequate filling.  Home today on  Asa/effient. BP controlled.  SCr stable and WNL.  FU with Dr. Herbie Baltimore.    LOS: 1 day    HAGER,BRYAN W 08/03/2011 8:09 AM   Patient seen and examined. Agree with assessment and plan.  Feels well. No chest pain or SOB. Cr 0.78.  DC today and fu with Dr. Herbie Baltimore.   Lennette Bihari, MD, [facility] 08/03/2011 8:24 AM

## 2011-08-03 NOTE — Progress Notes (Signed)
   CARE MANAGEMENT NOTE 08/03/2011  Patient:  Eric Bradshaw, Eric Bradshaw   Account Number:  [id number]  Date Initiated:  08/03/2011  Documentation initiated by:  [facility]  Subjective/Objective Assessment:   Ischemic cardiomyopathy, CAD, smoker     Action/Plan:   lives at home with girlfriend   Anticipated DC Date:  08/03/2011   Anticipated DC Plan:  HOME/SELF CARE      DC Planning Services  CM consult  Medication Assistance      Choice offered to / List presented to:             Status of service:  Completed, signed off Medicare Important Message given?   (If response is "NO", the following Medicare IM given date fields will be blank) Date Medicare IM given:   Date Additional Medicare IM given:    Discharge Disposition:  HOME/SELF CARE  Per UR Regulation:    If discussed at Long Length of Stay Meetings, dates discussed:    Comments:  08/03/2011 0930 Spoke to pt and gave him Effient information booklet with 30 day free trial and copay card. States he is currently on FMLA and if his EF does not go above 40 % he may be without a job. 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 Encourage pt to follow heart healthy diet, take medications as prescribed and discuss with physician exercise program. Also explained to pt the importance of quitting tobacco use and quitting could have health benefits that will improve his overall quality of life. Encouraged him to discuss cessation programs with his physician.  Isidoro Donning RN CCM Case Mgmt phone [phone]

## 2011-08-05 MED FILL — Dextrose Inj 5%: INTRAVENOUS | Qty: 50 | Status: AC

## 2011-08-06 MED FILL — Nicardipine HCl IV Soln 2.5 MG/ML: INTRAVENOUS | Qty: 1 | Status: AC

## 2011-11-07 ENCOUNTER — Ambulatory Visit (HOSPITAL_COMMUNITY)
Admission: RE | Admit: 2011-11-07 | Discharge: 2011-11-07 | Disposition: A | Discharge status: Home / Self Care | Payer: BC Managed Care – PPO | Source: Ambulatory Visit

## 2011-11-07 ENCOUNTER — Other Ambulatory Visit (HOSPITAL_COMMUNITY): Payer: Self-pay

## 2011-11-07 DIAGNOSIS — R52 Pain, unspecified: Secondary | ICD-10-CM

## 2011-11-07 DIAGNOSIS — M25519 Pain in unspecified shoulder: Secondary | ICD-10-CM | POA: Insufficient documentation

## 2012-08-19 ENCOUNTER — Telehealth: Payer: Self-pay | Admitting: Cardiology

## 2012-08-19 NOTE — Telephone Encounter (Signed)
Left message on vm to r/s appointment due to go live

## 2012-08-26 ENCOUNTER — Encounter: Payer: Self-pay | Admitting: Pharmacist Clinician (PhC)/ Clinical Pharmacy Specialist

## 2012-08-27 ENCOUNTER — Ambulatory Visit (INDEPENDENT_AMBULATORY_CARE_PROVIDER_SITE_OTHER): Payer: BC Managed Care – PPO | Admitting: Cardiology

## 2012-08-27 ENCOUNTER — Encounter: Payer: Self-pay | Admitting: Cardiology

## 2012-08-27 VITALS — BP 110/70 | HR 80 | Ht 67.0 in | Wt 185.4 lb

## 2012-08-27 DIAGNOSIS — I2589 Other forms of chronic ischemic heart disease: Secondary | ICD-10-CM

## 2012-08-27 DIAGNOSIS — I255 Ischemic cardiomyopathy: Secondary | ICD-10-CM

## 2012-08-27 DIAGNOSIS — I10 Essential (primary) hypertension: Secondary | ICD-10-CM

## 2012-08-27 DIAGNOSIS — I2510 Atherosclerotic heart disease of native coronary artery without angina pectoris: Secondary | ICD-10-CM

## 2012-08-27 DIAGNOSIS — E785 Hyperlipidemia, unspecified: Secondary | ICD-10-CM

## 2012-08-27 DIAGNOSIS — R7309 Other abnormal glucose: Secondary | ICD-10-CM

## 2012-08-27 DIAGNOSIS — Z6825 Body mass index (BMI) 25.0-25.9, adult: Secondary | ICD-10-CM

## 2012-08-27 DIAGNOSIS — F17200 Nicotine dependence, unspecified, uncomplicated: Secondary | ICD-10-CM

## 2012-08-27 DIAGNOSIS — E663 Overweight: Secondary | ICD-10-CM | POA: Insufficient documentation

## 2012-08-27 DIAGNOSIS — R739 Hyperglycemia, unspecified: Secondary | ICD-10-CM

## 2012-08-27 NOTE — Patient Instructions (Signed)
You continue do well from a heart standpoint. Thankfully you are nott having any chest pain or heart failure symptoms.  As we discussed, if you are feeling dizzy in the morning when you wake up take half a dose of your carvedilol.  We will get her labs from Dr. Vear Clock to review your cholesterol panel.  When your current refills of Effient are close to being finished (2-3 weeks left), contact my nurse Burt Knack, RN at our office to look into doing the yearly prescription for Plavix to [facility] - an Forensic scientist I will provide you the prescription.  You will followup with Corine Shelter, PA in about 6 months and then Dr. Herbie Baltimore in a year.  Marykay Lex, MD

## 2012-08-27 NOTE — Assessment & Plan Note (Signed)
I counseled him about the importance of continuing to work on weight loss as this will help his overall functionality. He is to be a quite vigorous with his working out, but after his recent move he has not yet set up his home gym. I admonished him to go ahead and get that set up so he can get back to exercising and lose weight again.

## 2012-08-27 NOTE — Progress Notes (Signed)
Patient ID: Eric Bradshaw, male   DOB: 1955-07-22, 57 y.o.   MRN: [id number] [facility]   Clinic Note: HPI: Eric Bradshaw is a 57 y.o. male with a PMH below who presents today for coronary artery disease/ischemic cardiomyopathy status post CABG.  He is earl doing quite well. He has no active symptoms of chest pain or shortness of breath rest of exertion, beyond his baseline shortness of breath from significant lung disease. He relates that before his bypass surgery was not able to mow his lawn in one setting, and now is able to do so but is quite worn out but not complete. 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 At the end along the work he is quite exhausted and enjoys a couple beers.  Cardiovascular ROS: negative for - edema, irregular heartbeat, loss of consciousness, murmur, orthopnea, palpitations, paroxysmal nocturnal dyspnea or rapid heart rate  Allergies  Allergen Reactions  . Morphine And Related Other (See Comments)    HEADACHE    Current Outpatient Prescriptions  Medication Sig Dispense Refill  . aspirin EC 81 MG tablet Take 81 mg by mouth daily.      . B Complex-C (SUPER B COMPLEX PO) Take 1 tablet by mouth daily.      . Calcium Carbonate-Vit D-Min (CALCIUM 1200 PO) Take 1,200 mg by mouth daily.      . carvedilol (COREG) 12.5 MG tablet Take 12.5 mg by mouth as directed. Take 6.25mg  each morning and 12.5mg  each evening.      . cholecalciferol (VITAMIN D) 1000 UNITS tablet Take 5,000 Units by mouth daily.      . Coenzyme Q10 (CO Q-10) 100 MG CAPS Take 100 mg by mouth daily.      Marland Kitchen lisinopril (PRINIVIL,ZESTRIL) 10 MG tablet Take 10 mg by mouth daily.      . magnesium oxide (MAG-OX) 400 MG tablet Take 400 mg by mouth daily.      . Multiple Vitamins-Minerals (MULTIVITAMINS THER. W/MINERALS) TABS Take 1 tablet by mouth daily.      . Omega-3 Fatty  Acids (FISH OIL) 1200 MG CAPS Take 2,400 mg by mouth daily.      . prasugrel (EFFIENT) 10 MG TABS Take 1 tablet (10 mg total) by mouth daily.  30 tablet  11  . simvastatin (ZOCOR) 40 MG tablet Take 40 mg by mouth every evening.      . vitamin C (ASCORBIC ACID) 500 MG tablet Take 1,500 mg by mouth daily.      . vitamin E (VITAMIN E) 1000 UNIT capsule Take 1,000 Units by mouth daily.       No current facility-administered medications for this visit.    Past Medical History  Diagnosis Date  . Ischemic cardiomyopathy 10/08/2011    Echo - EF 30-40%; mild/mod anterior wall hypokinesis; doppler flow suggestive of impaired LV relaxation; LA moderately dilated; annuloplasty ring noted in mitral position; mild/mod mitral regurgitation; RVsystolic pressure elevated at 65-78IONG;  . CAD (coronary artery disease), native coronary artery     Proximal LAD ~80% & mid - subtotal occlusion , RCA occlusion, LCx occlusion -- s/p CABG x 3  (LIMA-LAD, SVG-OM, SVG-RPDA); Cath 06/2011, patent grafts with severe proximal  LAD - D1 lesions  . HTN, goal below 130/80 03/2010  . Hyperlipidemia LDL goal < 70 03/2010  . Hyperglycemia 06/2011  Likely new Dx of Diabetes  . Tobacco dependence 03/2010    Initially quit - restarted ~mid 2012  . NSTEMI (non-ST elevated myocardial infarction) 03/2010    Complicated by Severe Acute Systolic CHF -- Class IV CHF,  . Chronic combined systolic and diastolic HF (heart failure) 03/2011    Class II, Stage C-D  . COPD (chronic obstructive pulmonary disease) 03/2010  . Shortness of breath   . CHF (congestive heart failure) 04/24/2011    R/L MV - EF 37%; mild perfusion defect due to attenuation w/mild to mod superimposed ischemia seen in apex, apical lateral and distal to mid ateroseptal region; LV systolic fcn moderately reduced; hypotensive BP response to stress w/o chest pain; high risk scan    Past Surgical History  Procedure Laterality Date  . Coronary artery bypass graft   03/2010    LIMA-LAD, SVG-OM, SVG-RPDA  . Mitral valvuloplasty  03/2010    conjuntive with CABG for Severe MR  . Left arm surgery - nos    . Tonsillectomy      Childhood  . Ischemic  cardiomyopathy      History   Social History  . Marital Status: Single    Spouse Name: Girfriend    Number of Children: 2  . Years of Education: N/A   Occupational History  .      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 Smoking status: Current Every Day Smoker -- 1.00 packs/day for 50 years  . Smokeless tobacco: Former Neurosurgeon    Types: Chew     Comment: Quit in 2011 but restarted mid 2012,uses E-cigarettes  . Alcohol Use: 1.5 oz/week    3 drink(s) per week     Comment: 3-6 Beers per week  . Drug Use: No  . Sexually Active: Yes   Other Topics Concern  . Not on file   Social History Narrative  . No narrative on file    ROS: Review of Systems - Negative except HPI and below. Respiratory ROS: positive for - cough and Exertional shortness of breath PHYSICAL EXAM BP 110/70  Pulse 80  Ht 5\' 7"  (1.702 m)  Wt 185 lb 6.4 oz (84.097 kg)  BMI 29.03 kg/m2 General appearance: alert, cooperative, appears stated age, no distress and In his normal mood and affect which is somewhat down and depressed in appearance nares somewhat jovial. He does have excessive sweating which has been his baseline for long-term. Neck: no adenopathy, no carotid bruit, no JVD, supple, symmetrical, trachea midline and thyroid not enlarged, symmetric, no tenderness/mass/nodules Lungs: clear to auscultation bilaterally, normal percussion bilaterally and Increased AP diameter and increased expiratory phase. Nonlabored, with good air movement Heart: regular rate and rhythm, S1, S2 normal, no S3 or S4, systolic murmur: holosystolic 1/6, blowing at apex and no rub Abdomen: soft, non-tender; bowel sounds normal; no masses,  no organomegaly and Mildly  obese Extremities: extremities normal, atraumatic, no cyanosis or edema Pulses: 2+ and symmetric Neurologic: Grossly normal  EAV:[id number] today: Yes Rate: 80 , Rhythm: Normal sinus;  Conduction Delay non-; AV Block: None; ST-T abnormalities: LVH changes with J-point elevation and inferolateral ST depressions. Is also a inferolateral Q waves consistent with inferior lateral MI, age undetermined  ASSESSMENT: Overall relatively stable from a cardiac standpoint. No active angina or heart failure symptoms.  Patient Active Problem List   Diagnosis Date Noted  . Hyperglycemia - Newly  diagnosed; likely Diabetes Mellitus 06/14/2011    Priority: High    Class: Diagnosis of  . CAD (coronary artery disease), native coronary artery - s/p CABG x 3 with prox LAD & D1 disease 03/15/2010    Priority: High    Class: History of  . Ischemic Cardiomyopathy - EF ~30-35%; Class II CHF 03/15/2000    Priority: High    Class: Chronic  . HTN, goal below 130/80 03/15/2010    Priority: Medium    Class: History of  . Hyperlipidemia LDL goal < 70 03/15/2010    Priority: Medium    Class: Chronic  . Tobacco dependence 03/15/2010    Priority: Medium  . NSTEMI (non-ST elevated myocardial infarction) 03/15/2010    Priority: Low    Class: History of    PLAN: Per problem list below  Orders Placed This Encounter  Procedures  . EKG 12-Lead    Marykay Lex, M.D., M.S. [facility] [address] [place], [place]  [address]  [phone] Pager # [phone] 08/27/2012 11:02 AM

## 2012-08-27 NOTE — Assessment & Plan Note (Signed)
Remains on aspirin and Effient. Can convert to Plavix when this current Effient bottle is complete.  We will get him signed up with [facility] on line yearly prescription when he is close to the Effient being completed. He is now more than one year out from a DES stent satiety or cava stopping Plavix for procedures, however all of life and be on long-term simply because the length of the stent and the location.

## 2012-08-27 NOTE — Assessment & Plan Note (Signed)
Overall, doing quite well. No active symptoms of heart failure. I'm not sure if his shortness of breath but he has with exertion is do to COPD/lung disease versus his low EF. He certainly is much better off than he was prior to his CABG. He did not note the he Much more improvement after the PCI symptomatically, however his girlfriend did say that he had when he afterwards. Continue current dose of ACE inhibitor and beta blocker, but did mention that he could cut his beta blocker dose in half if he feels excessively dizzy in the morning. Followup with Corine Shelter, PA in 6 months.

## 2012-08-27 NOTE — Assessment & Plan Note (Signed)
Is on simvastatin 40 mg daily. He barely had labs checked by his primary care physician recently. We need to get those labs.

## 2012-08-27 NOTE — Assessment & Plan Note (Signed)
He personally is no longer smoking real cigarettes. He is using electronic cigarettes (E-cigarettes), however he still has a significant amount of secondhand smoke contact from his girlfriend. I congratulated him on his continued abstinence from cigarettes, and challenged him to have his girlfriend quit as well.

## 2012-08-27 NOTE — Assessment & Plan Note (Signed)
Primary physician is increase his metformin dose of 500 mg twice a day.

## 2012-09-01 ENCOUNTER — Other Ambulatory Visit: Payer: Self-pay | Admitting: *Deleted

## 2012-09-01 MED ORDER — SIMVASTATIN 40 MG PO TABS: 40.0000 mg | ORAL_TABLET | Freq: Every evening | ORAL | Status: DC

## 2012-09-01 NOTE — Telephone Encounter (Signed)
rx sent to pharmacy by e-script  

## 2012-09-29 ENCOUNTER — Telehealth: Payer: Self-pay | Admitting: Cardiology

## 2012-09-29 ENCOUNTER — Telehealth: Payer: Self-pay | Admitting: *Deleted

## 2012-09-29 MED ORDER — CLOPIDOGREL BISULFATE 75 MG PO TABS: 75.0000 mg | ORAL_TABLET | Freq: Every day | ORAL | Status: DC

## 2012-09-29 NOTE — Telephone Encounter (Signed)
Spoke to patient. Informed him that a prescription will be sent to [facility]. The drugstore will contact the patient about payment. He will complete effient and then start clopidogrel 75 mg by mouth daily.

## 2012-09-29 NOTE — Telephone Encounter (Signed)
Was suppose to call you when he got down to 2wks left of Effient-you are suppose to switch him over to Plavix-Please call!

## 2012-10-06 ENCOUNTER — Telehealth: Payer: Self-pay | Admitting: Cardiology

## 2012-10-06 NOTE — Telephone Encounter (Signed)
Received clopidogrel 75mg  from [facility].  Making sure this is correct before he starts taking.  Assurred pt this is correct

## 2012-10-06 NOTE — Telephone Encounter (Signed)
Please call question about medicine he received in the mail-wants to be sure everything is right on it!

## 2012-10-13 IMAGING — CR DG CHEST 1V PORT
1 series · 1 of 1 positions shown · non-contrast
Comparison: 03/16/2010

CLINICAL DATA: Pneumonia, CABG, valve replacement.

PORTABLE CHEST - 1 VIEW

[AP]
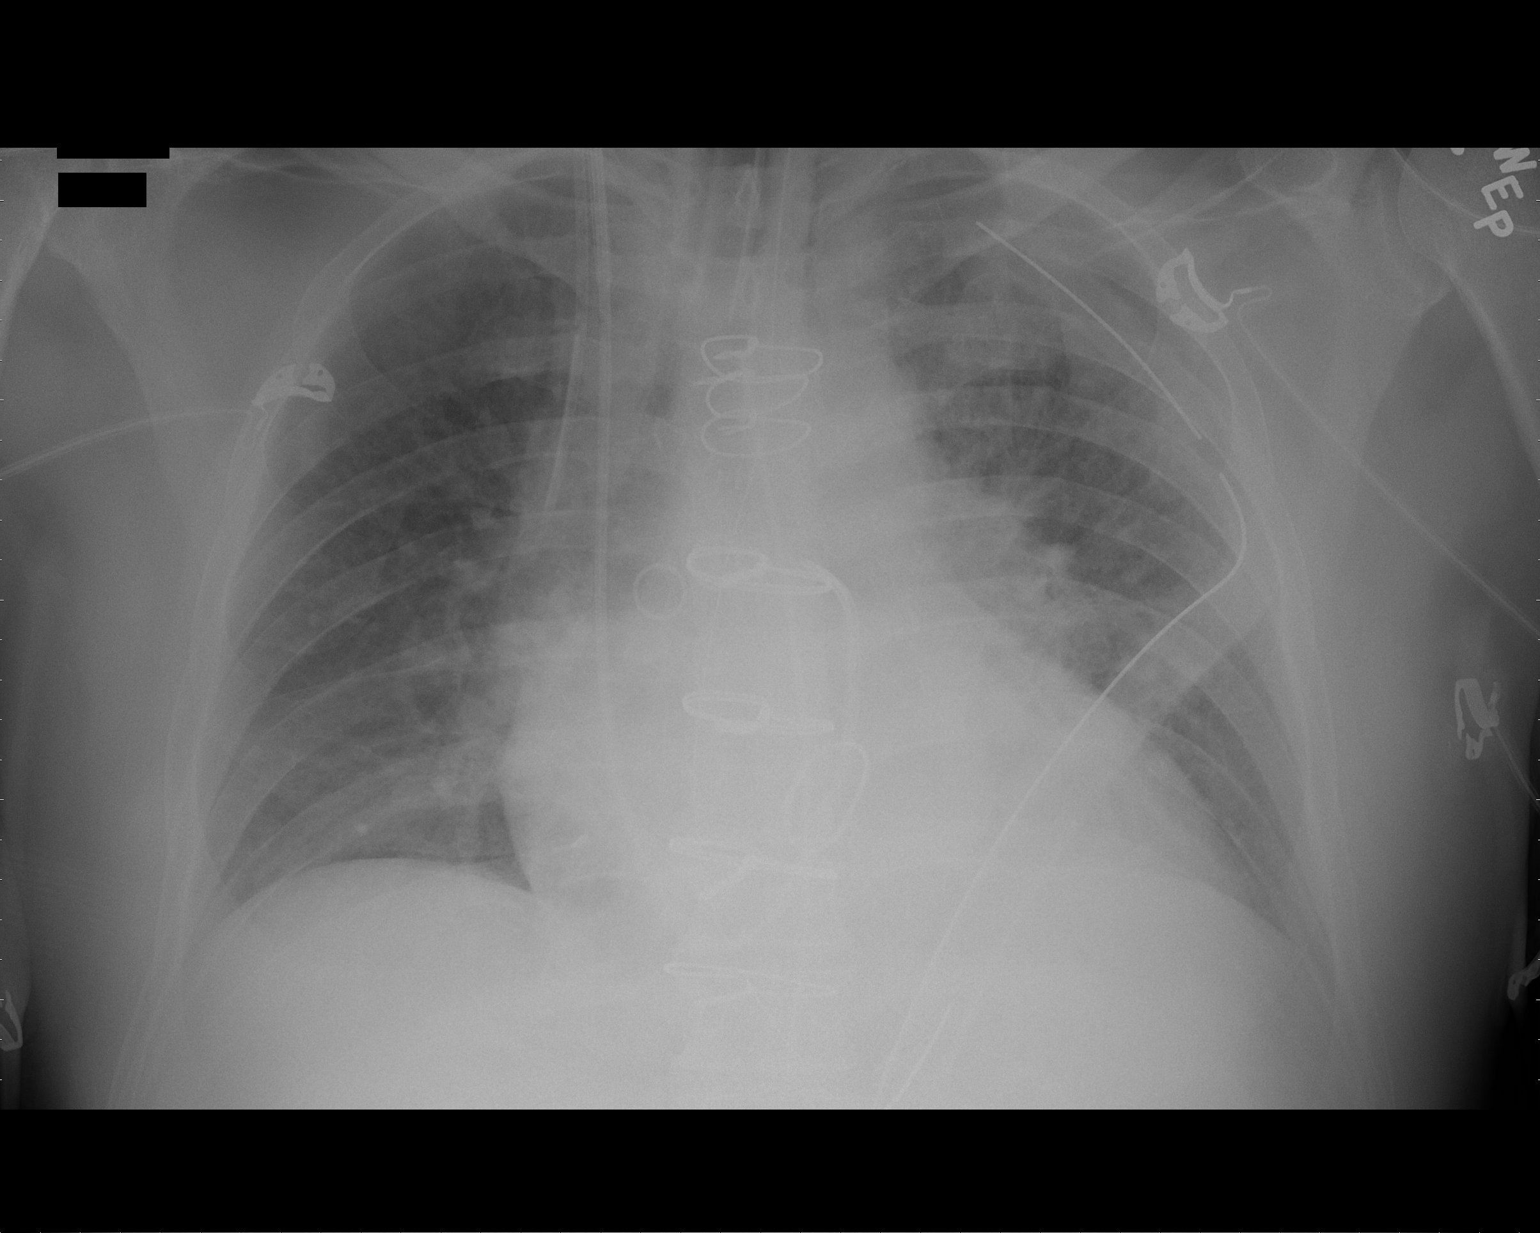

[1 of 1 positions shown; findings below may reference images not displayed]

FINDINGS: Changes of CABG.  Endotracheal tube is 4 cm above the
carina.  Swan-Ganz catheter tip is in the main pulmonary artery.
Left chest tube is in place.  No pneumothorax.

Changes of CABG and mitral valve replacement.  There is
cardiomegaly with vascular congestion.  No confluent opacities or
effusions.
IMPRESSION: Postoperative changes with support devices in expected position.
No pneumothorax.

Cardiomegaly, vascular congestion.

## 2012-10-15 IMAGING — CR DG CHEST 1V PORT
1 series · 1 of 1 positions shown · non-contrast
Comparison: 03/24/2010

CLINICAL DATA: Status post CABG, follow-up

PORTABLE CHEST - 1 VIEW

[view not recorded]
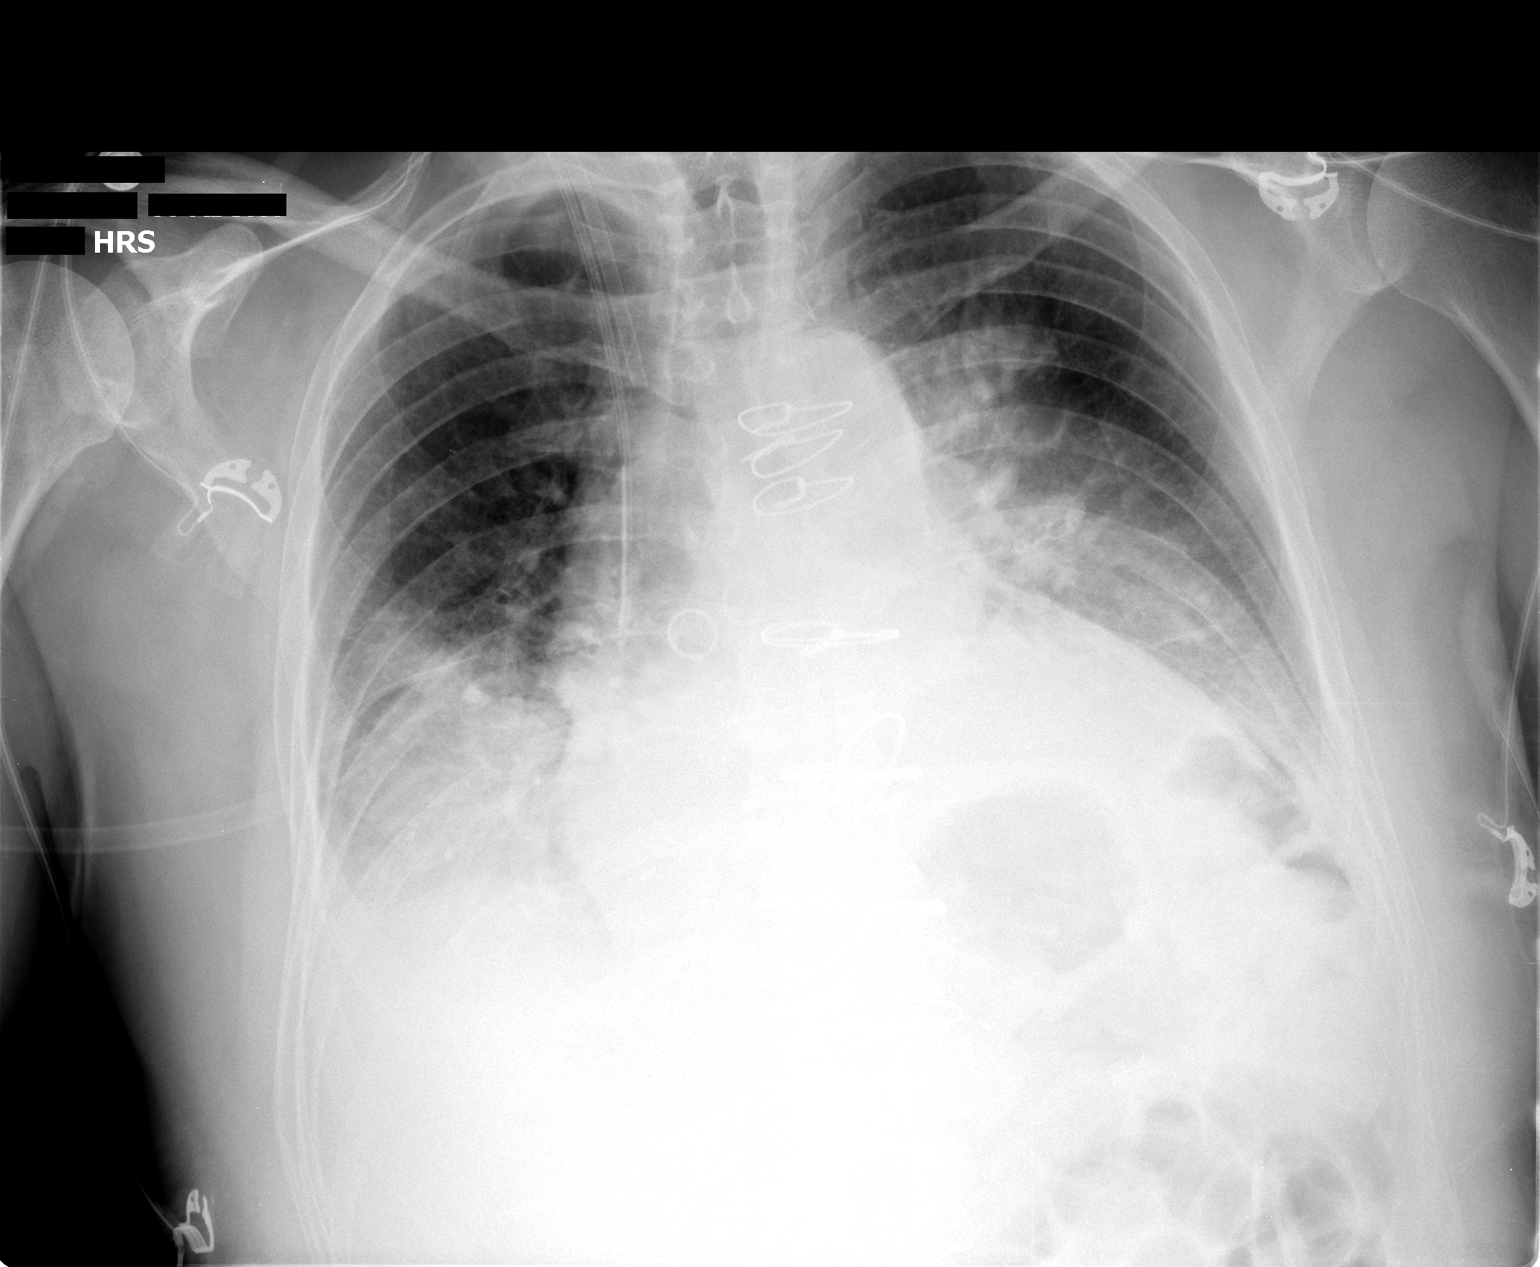

[1 of 1 positions shown; findings below may reference images not displayed]

FINDINGS: Right IJ Swan-Ganz catheter has been removed with the
sheath remaining in place over the IJ vein.  Mediastinal drains and
left chest tube also removed, without pneumothorax.  Moderate
enlargement of the cardiomediastinal silhouette noted with central
vascular congestion and a few interstitial Kerley B lines
compatible with interstitial edema.  Trace effusions persist.
Perihilar airspace opacities are minimally improved.
IMPRESSION: Minimal improvement in perihilar airspace probable edema with
residual interstitial edema and trace effusions.

## 2012-10-18 IMAGING — CR DG CHEST 2V
2 series · 2 of 2 positions shown · non-contrast
Comparison: Chest x-ray of 03/27/2010

CLINICAL DATA: Status post CABG chest x-ray of 03/27/2010

CHEST - 2 VIEW

[w chest pa]
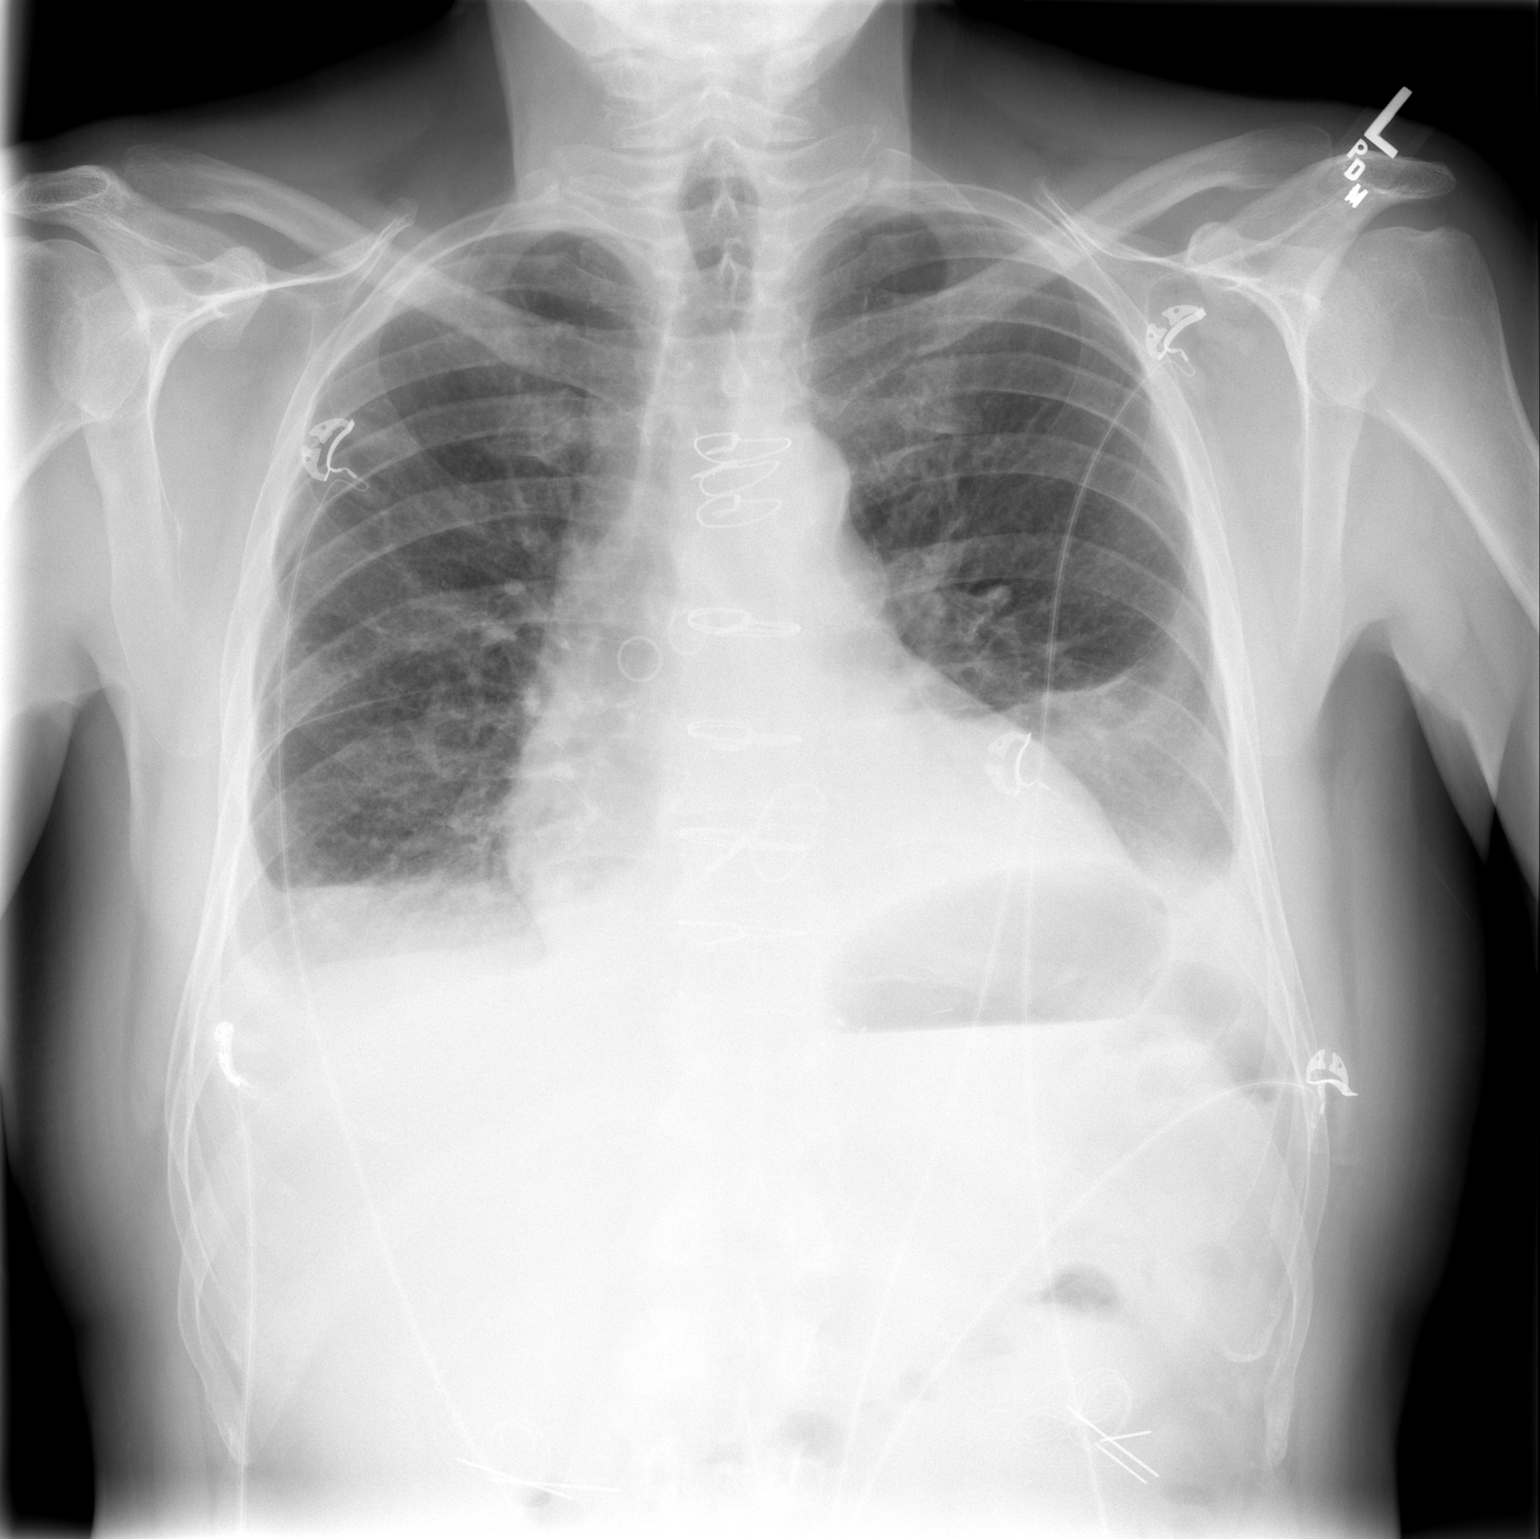

[w chest lat]
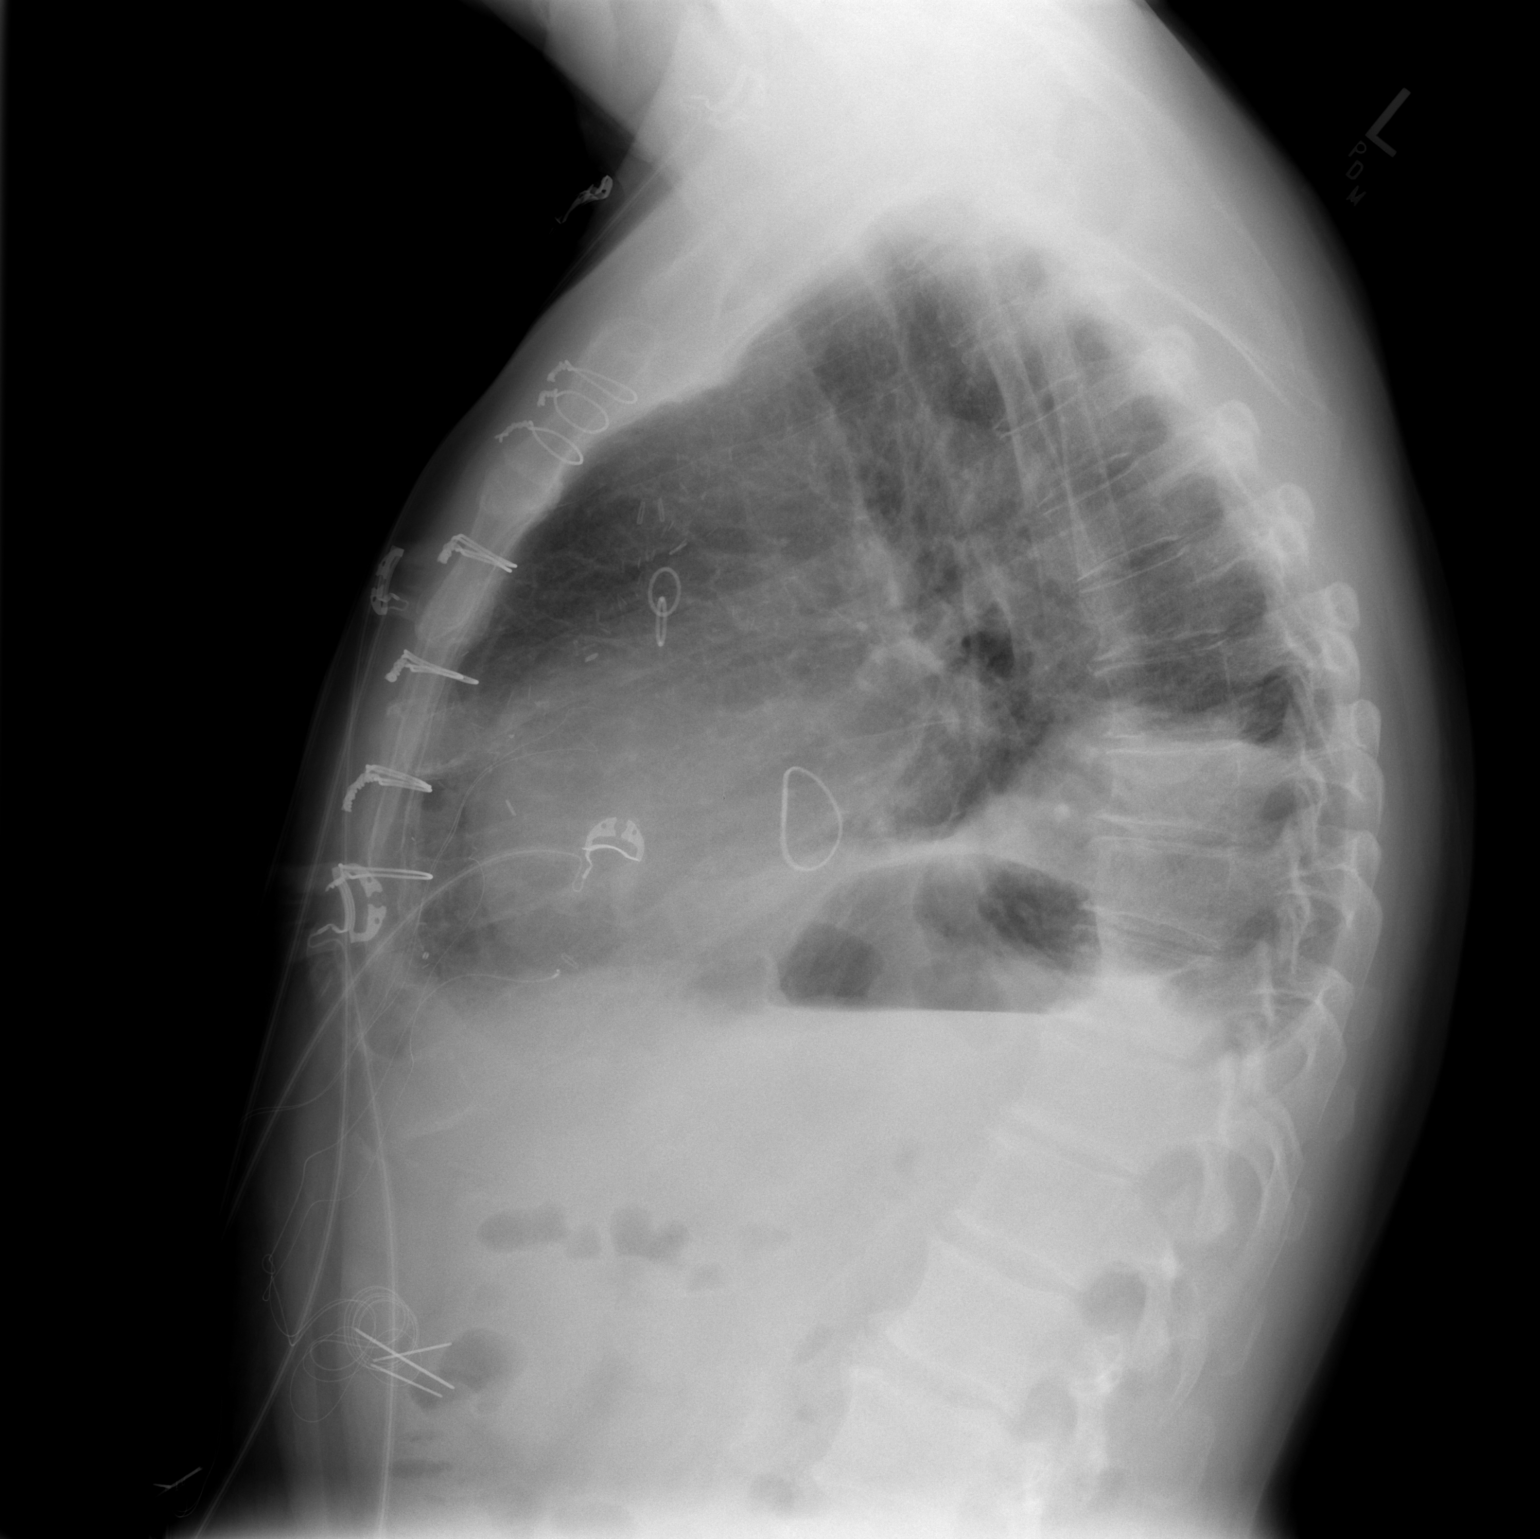

[2 of 2 positions shown; findings below may reference images not displayed]

FINDINGS: There is little change in basilar opacities left greater
than right most consistent with atelectasis and effusions.
Cardiomegaly is stable.  Median sternotomy sutures are noted.
Mitral valve replacement is present.
IMPRESSION: Little change in basilar opacities consistent with atelectasis and
effusions, left greater than right.

## 2013-01-24 ENCOUNTER — Other Ambulatory Visit: Payer: Self-pay | Admitting: Cardiology

## 2013-01-25 NOTE — Telephone Encounter (Signed)
Rx was sent to pharmacy electronically. 

## 2013-02-24 ENCOUNTER — Encounter: Payer: Self-pay | Admitting: Cardiology

## 2013-02-24 ENCOUNTER — Ambulatory Visit (INDEPENDENT_AMBULATORY_CARE_PROVIDER_SITE_OTHER): Payer: BC Managed Care – PPO | Admitting: Cardiology

## 2013-02-24 VITALS — BP 130/80 | HR 80 | Ht 67.0 in | Wt 188.5 lb

## 2013-02-24 DIAGNOSIS — I10 Essential (primary) hypertension: Secondary | ICD-10-CM

## 2013-02-24 DIAGNOSIS — E785 Hyperlipidemia, unspecified: Secondary | ICD-10-CM

## 2013-02-24 DIAGNOSIS — E119 Type 2 diabetes mellitus without complications: Secondary | ICD-10-CM | POA: Insufficient documentation

## 2013-02-24 DIAGNOSIS — I2589 Other forms of chronic ischemic heart disease: Secondary | ICD-10-CM

## 2013-02-24 DIAGNOSIS — R9431 Abnormal electrocardiogram [ECG] [EKG]: Secondary | ICD-10-CM

## 2013-02-24 DIAGNOSIS — I255 Ischemic cardiomyopathy: Secondary | ICD-10-CM

## 2013-02-24 DIAGNOSIS — F17200 Nicotine dependence, unspecified, uncomplicated: Secondary | ICD-10-CM

## 2013-02-24 DIAGNOSIS — I2510 Atherosclerotic heart disease of native coronary artery without angina pectoris: Secondary | ICD-10-CM

## 2013-02-24 NOTE — Assessment & Plan Note (Signed)
Dr Vear Clock follows

## 2013-02-24 NOTE — Assessment & Plan Note (Signed)
Controlled.  

## 2013-02-24 NOTE — Progress Notes (Signed)
02/24/2013 Eric Bradshaw   1955/05/26  [id number]  Primary Physicia Eric Ip, MD Primary Cardiologist: Dr Eric Bradshaw  HPI:  57 y/o followed by Dr Eric Bradshaw and Dr Eric Bradshaw with a history of CAD and valvular heart disease, S/P CABG X 3 in Dec 2011 with MV repair at that time. He had DOE and fatigue with an abnormal Myoview April 2013 and was restudied. His grafts were patent but he had high grade proximal native LAD disease that Dr Eric Bradshaw treated with a DES. Echo in June 2013 showed an EF of 30-40%.            He is here today for a 6 month office visit. He says he has fatigue with exertion, basically unchanged. When I asked him what type of activity he told me "using a chain saw". He denies chest pain or tightness but admits he did not have this before his CABG either. 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 He is on disability. 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   Current Outpatient Prescriptions  Medication Sig Dispense Refill  . aspirin EC 81 MG tablet Take 81 mg by mouth daily.      . B Complex-C (SUPER B COMPLEX PO) Take 1 tablet by mouth daily.      . Calcium Carbonate-Vit D-Min (CALCIUM 1200 PO) Take 1,200 mg by mouth daily.      . carvedilol (COREG) 12.5 MG tablet Take 0.5-1 tablets (6.25-12.5 mg total) by mouth 2 (two) times daily with a meal.  90 tablet  2  . cholecalciferol (VITAMIN D) 1000 UNITS tablet Take 5,000 Units by mouth daily.      . clopidogrel (PLAVIX) 75 MG tablet Take 1 tablet (75 mg total) by mouth daily.  365 tablet  0  . Coenzyme Q10 (CO Q-10) 100 MG CAPS Take 100 mg by mouth daily.      . Flaxseed, Linseed, (FLAX SEED OIL) 1000 MG CAPS Take 1 capsule by mouth daily.      Marland Kitchen glimepiride (AMARYL) 2 MG tablet Take 2 mg by mouth daily with breakfast.      . lactose free nutrition (BOOST) LIQD Take 237 mLs by mouth daily as needed.      Marland Kitchen  lisinopril (PRINIVIL,ZESTRIL) 10 MG tablet Take 10 mg by mouth daily.      . magnesium oxide (MAG-OX) 400 MG tablet Take 400 mg by mouth daily.      . metFORMIN (GLUCOPHAGE) 500 MG tablet Take 500 mg by mouth 2 (two) times daily with a meal.       . Multiple Vitamins-Minerals (MULTIVITAMINS THER. W/MINERALS) TABS Take 1 tablet by mouth daily.      . Omega-3 Fatty Acids (FISH OIL) 1200 MG CAPS Take 2,400 mg by mouth daily.      . simvastatin (ZOCOR) 40 MG tablet Take 1 tablet (40 mg total) by mouth every evening.  30 tablet  11  . vitamin C (ASCORBIC ACID) 500 MG tablet Take 1,500 mg by mouth daily.      . vitamin E (VITAMIN E) 1000 UNIT capsule Take 1,000 Units by mouth daily.       No current facility-administered medications for this visit.    Allergies  Allergen Reactions  . Morphine And Related Other (See Comments)    HEADACHE    History   Social History  .  Marital Status: Single    Spouse Name: Girfriend    Number of Children: 2  . Years of Education: N/A   Occupational History  .      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 Smoking status: Current Every Day Smoker -- 1.00 packs/day for 50 years  . Smokeless tobacco: Former Neurosurgeon    Types: Chew     Comment: Quit in 2011 but restarted mid 2012,uses E-cigarettes; Not actually smoking, but does have 2nd hand exposure.  . Alcohol Use: 1.5 oz/week    3 drink(s) per week     Comment: 3-6 Beers per week  . Drug Use: No  . Sexual Activity: Yes   Other Topics Concern  . Not on file   Social History Narrative  . No narrative on file     Review of Systems: General: negative for chills, fever, night sweats or weight changes.  Cardiovascular: negative for chest pain, edema, orthopnea, palpitations, paroxysmal nocturnal dyspnea or shortness of breath Dermatological: negative for rash Respiratory: negative for cough or wheezing Urologic: negative for  hematuria Abdominal: negative for nausea, vomiting, diarrhea, bright red blood per rectum, melena, or hematemesis Neurologic: negative for visual changes, syncope, or dizziness All other systems reviewed and are otherwise negative except as noted above.    Blood pressure 130/80, pulse 80, height 5\' 7"  (1.702 m), weight 188 lb 8 oz (85.503 kg).  General appearance: alert, cooperative and no distress Neck: no carotid bruit and no JVD Lungs: clear to auscultation bilaterally Heart: regular rate and rhythm and 1/6 apical systolic murmur  EKG NSR with inferior lateral Qs and NSST changes, similar to prior EKGs.  ASSESSMENT AND PLAN:   CAD - s/p CABG x 17 Mar 2010. Abnormal Myoview-LAD & D1 disease 06/2011 Rx'd with Promus DES No angina. 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 He takes half his usual dose when he has to work. I suggested that was OK but not to stop this medication without checking with [place]. I explained that using a chainsaw is pretty strenuous from a cardiac standpoint and that he may need to temper his expectations of what he can do. He realizes this -"it's depressing". I will get copies of his lab from Dr Vear Clock. I did not change his medications. He can follow up with Dr Eric Bradshaw in 6 months.   [facility] 02/24/2013 8:57 AM

## 2013-02-24 NOTE — Assessment & Plan Note (Signed)
No angina. Chronic fatigue with exertion- no change

## 2013-02-24 NOTE — Assessment & Plan Note (Signed)
Has an occasional smoke

## 2013-02-24 NOTE — Assessment & Plan Note (Signed)
Followed by Dr. Phillips 

## 2013-02-24 NOTE — Assessment & Plan Note (Signed)
No change from previous EKGs

## 2013-02-24 NOTE — Assessment & Plan Note (Signed)
EF 30-40% by echo 6/13- no symptoms of CHF

## 2013-02-24 NOTE — Patient Instructions (Signed)
Your physician recommends that you schedule a follow-up appointment in: 6 months with Dr Harding 

## 2013-05-31 ENCOUNTER — Other Ambulatory Visit: Payer: Self-pay | Admitting: Cardiology

## 2013-06-15 ENCOUNTER — Other Ambulatory Visit: Payer: Self-pay | Admitting: Cardiology

## 2013-06-29 ENCOUNTER — Other Ambulatory Visit: Payer: Self-pay | Admitting: *Deleted

## 2013-06-30 ENCOUNTER — Other Ambulatory Visit: Payer: Self-pay

## 2013-06-30 MED ORDER — LISINOPRIL 10 MG PO TABS: 10.0000 mg | ORAL_TABLET | Freq: Every day | ORAL | Status: DC

## 2013-06-30 NOTE — Telephone Encounter (Signed)
Rx was sent to pharmacy electronically. 

## 2013-08-23 ENCOUNTER — Ambulatory Visit (INDEPENDENT_AMBULATORY_CARE_PROVIDER_SITE_OTHER): Payer: BC Managed Care – PPO | Admitting: Cardiology

## 2013-08-23 ENCOUNTER — Encounter: Payer: Self-pay | Admitting: Cardiology

## 2013-08-23 VITALS — BP 132/80 | HR 83 | Ht 69.0 in | Wt 184.5 lb

## 2013-08-23 DIAGNOSIS — I255 Ischemic cardiomyopathy: Secondary | ICD-10-CM

## 2013-08-23 DIAGNOSIS — Z9861 Coronary angioplasty status: Secondary | ICD-10-CM

## 2013-08-23 DIAGNOSIS — I214 Non-ST elevation (NSTEMI) myocardial infarction: Secondary | ICD-10-CM

## 2013-08-23 DIAGNOSIS — I2510 Atherosclerotic heart disease of native coronary artery without angina pectoris: Secondary | ICD-10-CM

## 2013-08-23 DIAGNOSIS — R9431 Abnormal electrocardiogram [ECG] [EKG]: Secondary | ICD-10-CM

## 2013-08-23 DIAGNOSIS — F17200 Nicotine dependence, unspecified, uncomplicated: Secondary | ICD-10-CM

## 2013-08-23 DIAGNOSIS — I10 Essential (primary) hypertension: Secondary | ICD-10-CM

## 2013-08-23 DIAGNOSIS — I2589 Other forms of chronic ischemic heart disease: Secondary | ICD-10-CM

## 2013-08-23 DIAGNOSIS — Z9889 Other specified postprocedural states: Secondary | ICD-10-CM

## 2013-08-23 DIAGNOSIS — Z951 Presence of aortocoronary bypass graft: Secondary | ICD-10-CM

## 2013-08-23 DIAGNOSIS — E785 Hyperlipidemia, unspecified: Secondary | ICD-10-CM

## 2013-08-23 NOTE — Patient Instructions (Signed)
Your physician recommends that you schedule a follow-up appointment in: 6 Months with Kerin Ransom and 1 year with Dr. Ellyn Hack  Your physician has requested that you have an echocardiogram. Echocardiography is a painless test that uses sound waves to create images of your heart. It provides your doctor with information about the size and shape of your heart and how well your heart's chambers and valves are working. This procedure takes approximately one hour. There are no restrictions for this procedure.

## 2013-08-25 ENCOUNTER — Ambulatory Visit (HOSPITAL_COMMUNITY)
Admission: RE | Admit: 2013-08-25 | Discharge: 2013-08-25 | Disposition: A | Discharge status: Home / Self Care | Payer: BC Managed Care – PPO | Source: Ambulatory Visit | Attending: Cardiovascular Disease | Admitting: Cardiovascular Disease

## 2013-08-25 DIAGNOSIS — I2589 Other forms of chronic ischemic heart disease: Secondary | ICD-10-CM | POA: Insufficient documentation

## 2013-08-25 DIAGNOSIS — I059 Rheumatic mitral valve disease, unspecified: Secondary | ICD-10-CM

## 2013-08-25 DIAGNOSIS — R9431 Abnormal electrocardiogram [ECG] [EKG]: Secondary | ICD-10-CM

## 2013-08-25 DIAGNOSIS — I255 Ischemic cardiomyopathy: Secondary | ICD-10-CM

## 2013-08-25 NOTE — Progress Notes (Signed)
Quick Note:  Essentially stable EF -- still ~35-40%. No real change. Valves are OK.  Leonie Man, MD  ______

## 2013-08-25 NOTE — Progress Notes (Signed)
2D Echo Performed 08/25/2013    Eric Bradshaw, RCS

## 2013-08-27 ENCOUNTER — Telehealth: Payer: Self-pay | Admitting: *Deleted

## 2013-08-27 NOTE — Telephone Encounter (Signed)
Message copied by Raiford Simmonds on Fri Aug 27, 2013  9:28 AM ------      Message from: Leonie Man      Created: Wed Aug 25, 2013  2:07 PM       Essentially stable EF -- still ~35-40%.  No real change.      Valves are OK.            Leonie Man, MD       ------

## 2013-08-27 NOTE — Telephone Encounter (Signed)
Spoke to patient. Result given . Verbalized understanding  

## 2013-08-29 ENCOUNTER — Encounter: Payer: Self-pay | Admitting: Cardiology

## 2013-08-29 NOTE — Assessment & Plan Note (Signed)
He never really had Angina Symptoms -- was mostly CHF.  Thankfully, no further episodes.

## 2013-08-29 NOTE — Assessment & Plan Note (Signed)
On Statin -- followed by PCP along with DM-2.

## 2013-08-29 NOTE — Progress Notes (Signed)
PCP: Ronald Lobo, MD  Clinic Note: Chief Complaint  Patient presents with  . Follow-up    6 months follow-up, pt stated he's been feeling in his chest and back lasted for 5 days.    HPI: Eric Bradshaw is a 58 y.o. male with a Cardiovascular history noted below who presents today for 6 month followup of his CAD/Ischemic Cardiomyopathy. He saw Kerin Ransom back in November, and was doing quite well with no active symptoms of heart failure angina. No medication changes were made.  Interval History: He presents today doing quite well. Says he still gets tired a little easier than usual. This morning had. However he denies any real exertional chest tightness or pressure. He really is not note much exertional dyspnea until the end of the day. He was short of breath if he walks up a hill or a flight of steps.  He had an episode about 5 days ago of aching sensation across his chest felt like a band. It lasted several days particular activities , and was not exacerbated or alleviated by any activities. He note that symptoms improved with Advil.  He had done a lot of heavy exertion in the preceding couple days.  After a couple doses of Aleve and Tylenol, symptoms got better. He sort of self adjust his carvedilol dosing taking 1/2  tablet morning 1 at night. Otherwise he feels totally exhausted.   he is pretty much completely quit smoking. Every now and then he'll have a cigarette. Usually he is using his electronic cigarettes.  He says he maybe has a cigarette every couple days, or if he has a few extra beers.  No PND, orthopnea or edema. No palpitations, lightheadedness, dizziness, weakness or syncope/near syncope. No TIA/amaurosis fugax symptoms. No melena, hematochezia, hematuria, or epstaxis. No claudication.  Past Medical History  Diagnosis Date  . NSTEMI (non-ST elevated myocardial infarction) 41/2878    Complicated by Severe Acute Systolic CHF -- Class IV CHF,  . CAD S/P  percutaneous coronary angioplasty 03/2010 -- 3-07/2011    Proximal LAD ~80% (into D2) & mid - subtotal occlusion , RCA occlusion, LCx occlusion -- s/p CABG x 3  (LIMA-LAD, SVG-OM, SVG-RPDA); Cath 06/2011, patent grafts with severe proximal  LAD - D1 lesions -- 07/2011: PCI  proxLAD-into D2 with Promus Premier DES 2.5 mm x 38 mm (distal in D2 2.5 mm, @ bifurcation - 2.75 m, in prox LAD ~3.0 mm  . Ischemic cardiomyopathy 03/2010    Echo - EF 30-40%; mild/mod anterior wall hypokinesis; doppler flow suggestive of impaired LV relaxation; LA moderately dilated; annuloplasty ring noted in mitral position; mild/mod mitral regurgitation; RVsystolic pressure elevated at 30-48mmHg;  . Chronic combined systolic and diastolic HF (heart failure), NYHA class 2 03/2010    EF ~30-30%; Exertional Dyspnea; No exaccerbations.  . S/P CABG x 3 03/2010    LIMA-mLAD, SVG-OM, SVG-rPDA  . S/P MVR (mitral valve repair) 03/2010    @ time of CABG for ischemic MR  . HTN, goal below 130/80 03/2010  . Hyperlipidemia LDL goal < 70   . DM (diabetes mellitus) type II controlled peripheral vascular disorder 2013    & CAD  . Tobacco dependence     Initially quit - restarted ~mid 2012; now essentially quit.  Marland Kitchen COPD (chronic obstructive pulmonary disease)     Prior Cardiac Evaluation/Procedural/Surgical History: Procedure Laterality Date  . Coronary artery bypass graft  03/2010    LIMA-LAD, SVG-OM, SVG-RPDA  . Mitral valvuloplasty  03/2010  conjuntive with CABG for Severe MR  . Transthoracic echocardiogram  03/23/2010     Pre-CABG/MVR: EF 25-30%; global HK; Mod-Severe MR  . Nm myoview ltd  04/24/2011    R/L MV - EF 37%; mild perfusion defect due to attenuation w/mild to mod superimposed ischemia seen in apex, apical lateral and distal to mid ateroseptal region; LV systolic fcn moderately reduced; hypotensive BP response to stress w/o chest pain; high risk scan  . Cardiac catheterization  06/2011    Patent LIMA-LAD, SVG-OM,  SVG-RPDA; (native RCA & OM prox 100%, prox LAD into major D2 ~80% long lesion, midLAD 100%  . Percutaneous coronary stent intervention (pci-s)  07/2011    PCI of prox LAD into D2: Promus Premier DES 2.5 mm x 38 m (2.5 in D2, 2.75 at branch, 3.1 in LAD)  . Transthoracic echocardiogram  09/2011    EF up to 30-40% (post PCI); mild-mod Ant wall HK; Gd 1 DD; MV Annuloplasty ring in place, mild-mod MR; mild PHTN (30-40 mmHg)   MEDICATIONS AND ALLERGIES REVIEWED IN EPIC No Change in Social and Family History  ROS: A comprehensive Review of Systems - Negative except Fatigue, muscular skeletal pains in his chest and back, exertional dyspnea as noted.; Symptoms consistent with dysthymia  PHYSICAL EXAM BP 132/80  Pulse 83  Ht 5\' 9"  (1.753 m)  Wt 184 lb 8 oz (83.689 kg)  BMI 27.23 kg/m2 General appearance: alert, cooperative, appears stated age, no distress and In his normal mood and affect which is somewhat down and depressed.  Intermittently jovial. Neck: supple, no LAN, JVD or carotid bruit.   Lungs: CTAB, normal percussion bilaterally and Increased AP diameter and increased expiratory phase. Nonlabored, with good air movement  Heart: RRR, S1, S2 normal, no S3 or S4, systolic murmur: holosystolic 1/6, blowing at apex and no rub  Abdomen: soft, non-tender; bowel sounds normal; no masses, no organomegaly and Mildly obese  Extremities: extremities normal, atraumatic, no cyanosis or edema  Pulses: 2+ and symmetric  Neurologic: Grossly normal   Adult ECG Report  Rate: 83 ;  Rhythm: normal sinus rhythm; normal intervals & axis; old Inferior & Anterolateral MI   Narrative Interpretation: stable ECG  Recent Labs Not available   ASSESSMENT / PLAN: CAD - s/p CABG x 17 Mar 2010. Abnormal Myoview-LAD & D1 disease 06/2011 Rx'd with Promus DES No active angina symptoms at rest or with exertion.  Stable exertional dyspnea. On BB, ACE-I & DAPT for long DES stent.  Ischemic Cardiomyopathy - EF ~30-40%; Class  II CHF On stable regimen.  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  Currently on at least partial disability b/c unable to perform his former job.   Recheck Echo -- based on lack of Sx, EF may be better.  Also need to assess MR.  NSTEMI (non-ST elevated myocardial infarction) He never really had Angina Symptoms -- was mostly CHF.  Thankfully, no further episodes.   HTN, goal below 130/80 Pretty much @ goal.  Continue current regimen.  Hyperlipidemia LDL goal < 70 On Statin -- followed by PCP along with DM-2.    Tobacco dependence Continued to counsel him to finally make the clean break.  S/P MVR (mitral valve repair) Mild-Mod MR on last Echo -- recheck Echo for EF & MR.    Orders Placed This Encounter  Procedures  . EKG 12-Lead  . 2D Echocardiogram without contrast    Standing Status: Future  Number of Occurrences: 1     Standing Expiration Date: 08/24/2014    Order Specific Question:  Type of Echo    Answer:  Complete    Order Specific Question:  Where should this test be performed    Answer:  [facility]    Order Specific Question:  Reason for exam-Echo    Answer:  Cardiomyopathy-Ischemic  414.8    Order Specific Question:  Reason for exam-Echo    Answer:  Dyspnea  786.09   No orders of the defined types were placed in this encounter.    Followup: 6 months with Kerin Ransom, PA-C; 1 yr with Dr. Ellyn Hack.  DAVID W. Ellyn Hack, M.D., M.S. Interventional Cardiologist [facility]

## 2013-08-29 NOTE — Assessment & Plan Note (Signed)
On stable regimen.  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  Currently on at least partial disability b/c unable to perform his former job.   Recheck Echo -- based on lack of Sx, EF may be better.  Also need to assess MR.

## 2013-08-29 NOTE — Assessment & Plan Note (Signed)
Pretty much @ goal.  Continue current regimen.

## 2013-08-29 NOTE — Assessment & Plan Note (Signed)
Continued to counsel him to finally make the clean break.

## 2013-08-29 NOTE — Assessment & Plan Note (Signed)
No active angina symptoms at rest or with exertion.  Stable exertional dyspnea. On BB, ACE-I & DAPT for long DES stent.

## 2013-08-29 NOTE — Assessment & Plan Note (Signed)
Mild-Mod MR on last Echo -- recheck Echo for EF & MR.

## 2013-10-21 ENCOUNTER — Other Ambulatory Visit: Payer: Self-pay | Admitting: *Deleted

## 2013-10-21 MED ORDER — SIMVASTATIN 40 MG PO TABS: 40.0000 mg | ORAL_TABLET | Freq: Every evening | ORAL | Status: DC

## 2013-10-21 NOTE — Telephone Encounter (Signed)
Rx was sent to pharmacy electronically. 

## 2013-11-03 ENCOUNTER — Telehealth: Payer: Self-pay | Admitting: Cardiology

## 2013-11-03 NOTE — Telephone Encounter (Signed)
Pt said his job had sent over a form,concerning his ejection fraction. They never received it back,they are going to mail out another one today.When you get this or send the other one back to [phone] please.

## 2013-11-03 NOTE — Telephone Encounter (Signed)
Patient notified that Eric Bradshaw will have this form filled out and faxed to the number provided.  She will be back in the office tomorrow.  Patient voiced understanding.

## 2013-11-08 NOTE — Telephone Encounter (Signed)
Left message on voice mail - information sent to insurance company.

## 2013-11-08 NOTE — Telephone Encounter (Signed)
Faxed last office note,and last echo report

## 2013-11-22 ENCOUNTER — Other Ambulatory Visit: Payer: Self-pay | Admitting: *Deleted

## 2013-11-22 MED ORDER — CLOPIDOGREL BISULFATE 75 MG PO TABS: 75.0000 mg | ORAL_TABLET | Freq: Every day | ORAL | Status: DC

## 2013-11-22 NOTE — Telephone Encounter (Signed)
Rx refill sent to patient pharmacy   

## 2014-01-04 ENCOUNTER — Other Ambulatory Visit: Payer: Self-pay | Admitting: Cardiology

## 2014-01-04 NOTE — Telephone Encounter (Signed)
Rx was sent to pharmacy electronically. 

## 2014-03-24 ENCOUNTER — Encounter (HOSPITAL_COMMUNITY): Payer: Self-pay | Admitting: Cardiology

## 2014-03-29 ENCOUNTER — Telehealth: Payer: Self-pay | Admitting: Cardiology

## 2014-03-29 ENCOUNTER — Other Ambulatory Visit: Payer: Self-pay | Admitting: Cardiology

## 2014-03-29 NOTE — Telephone Encounter (Signed)
Received form from [facility] requesting records and also a form-Physical Ability Assessment Form to be completed by Dr Ellyn Hack and returned to [facility].  Records to be copied by [employer] (on site) and the Assessment Form given to Dr Ellyn Hack to complete and sign.  Given to Earlie Server RN for Dr Ellyn Hack

## 2014-04-06 ENCOUNTER — Telehealth: Payer: Self-pay | Admitting: *Deleted

## 2014-04-06 NOTE — Telephone Encounter (Signed)
Disability form signed and filled out given to [place] to ccontinue the (H.I.M.)process.

## 2014-04-07 ENCOUNTER — Telehealth: Payer: Self-pay | Admitting: Cardiology

## 2014-04-07 NOTE — Telephone Encounter (Signed)
Received [employer] Physical Assessment form signed by Dr Ellyn Hack on 04/06/14.  Faxed to [facility] 04/07/14. Records were processed by [employer] (on site) 03/31/14.  lp

## 2014-09-12 ENCOUNTER — Other Ambulatory Visit: Payer: Self-pay | Admitting: Cardiology

## 2014-09-13 NOTE — Telephone Encounter (Signed)
Rx(s) sent to pharmacy electronically.  

## 2015-01-10 ENCOUNTER — Encounter: Payer: Self-pay | Admitting: Cardiology

## 2015-01-10 NOTE — Progress Notes (Signed)
PCP: Morton Peters, MD  Clinic Note: Chief Complaint  Patient presents with  . Annual Exam    pt states no Sx.//drinks a couple beers every day  . Coronary Artery Disease  . Cardiomyopathy    HPI: Eric Bradshaw is a 59 y.o. male with a PMH below who presents today for delayed annual follow-up of his CAD/ICM. He had a significant non-ST elevation MI presenting with florid heart failure in December 2011. He was found to have significant multivessel CAD and underwent CABG. His initially very low EF of roughly 20% increased following CABG to roughly 30 %, and then Improved to 30-40% following PCI of the LAD into D2 with a long Promus DES stent. 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 He is now working multipl EF 35-40% with moderately reduced function. Moderately dilated LV with distal septal-apical, mid and basal inferior akinesis; status post mitral valve ring. Significant diastolic gradient with mild MR. Moderate LA dilation. e different jobs to try to keep making ends meet. He tends to auto regulate his medication dosing of carvedilol.  Eric Bradshaw was last seen on 08/23/2013. He only noted that he gets a little bit tired easier than usual. He had not had any recurrent anginal type chest discomfort or pressure..Note much the way of exertional dyspnea.   Recent Hospitalizations: None at [facility]   Studies Reviewed: Echocardiogram Sep 03, 2013: Updated [place]   EF 35-40% with moderately reduced function. Moderately dilated LV with distal septal-apical, mid and basal inferior akinesis; status post mitral valve ring. Significant diastolic gradient with mild MR. Moderate LA dilation.   Interval History: Eric Bradshaw presents today without any major complaints. He has not had any recurrence of any angina or heart failure symptoms. She actually is: Getting around better now he had been in a while. He does still  say that if he is trying to a lot, he will notice a little fatigue or dyspnea.   No chest pain or shortness of breath with rest or exertion.  No PND, orthopnea or edema.  No palpitations, lightheadedness, dizziness, weakness or syncope/near syncope. No TIA/amaurosis fugax symptoms.   ROS: A comprehensive was performed. Review of Systems  Constitutional:       He exercises it the level of activity is doing now is so much more that was prior to his heart attack the really doesn't have any complaints  Respiratory: Negative for hemoptysis and shortness of breath.   Cardiovascular: Negative for claudication.       Minimal  Gastrointestinal: Negative for heartburn, blood in stool and melena.  Musculoskeletal: Positive for joint pain. Negative for myalgias (shoulders and knees).  Skin: Negative for rash.  Neurological: Negative.   Endo/Heme/Allergies: Does not bruise/bleed easily.  All other systems reviewed and are negative.   Past Medical History  Diagnosis Date  . NSTEMI (non-ST elevated myocardial infarction) 84/1660    Complicated by Severe Acute Systolic CHF -- Class IV CHF,  . CAD S/P percutaneous coronary angioplasty 03/2010 -- 3-07/2011    Proximal LAD ~80% (into D2) & mid - subtotal occlusion , RCA occlusion, LCx occlusion -- s/p CABG x 3  (LIMA-LAD, SVG-OM, SVG-RPDA); Cath 06/2011, patent grafts with severe proximal  LAD - D1 lesions -- 07/2011: PCI  proxLAD-into D2 with Promus Premier DES 2.5 mm x 38 mm (distal in D2 2.5 mm, @ bifurcation - 2.75  m, in prox LAD ~3.0 mm  . Ischemic cardiomyopathy 03/2010; May 2015    a) Echo - EF 30-40%; mild/mod anterior wall hypokinesis; doppler flow suggestive of impaired LV relaxation; LA moderately dilated; annuloplasty ring noted in mitral position; mild/mod mitral regurgitation; RVsystolic pressure elevated at 30-4mmHg;;; b) Echo May 2015: EF 35-40%  . Chronic combined systolic and diastolic HF (heart failure), NYHA class 2 03/2010; 08/2103     a) EF ~30-30%; Exertional Dyspnea; No exaccerbations.;; b)EF 35-40%, Mod LA dilation  . S/P CABG x 3 03/2010    LIMA-mLAD, SVG-OM, SVG-rPDA  . S/P MVR (mitral valve repair) 03/2010    @ time of CABG for ischemic MR - 26 mm Edwards physio-2 annuloplasty ring  . HTN, goal below 130/80 03/2010  . Hyperlipidemia LDL goal < 70   . DM (diabetes mellitus) type II controlled peripheral vascular disorder 2013    & CAD  . Tobacco dependence     Initially quit - restarted ~mid 2012; now essentially quit.  Marland Kitchen COPD (chronic obstructive pulmonary disease)    Past Surgical History  Procedure Laterality Date  . Coronary artery bypass graft  03/2010    LIMA-LAD, SVG-OM, SVG-RPDA  . Mitral valvuloplasty  03/2010    conjuntive with CABG for Severe MR; 26 mm Edwards physio-2 annuloplasty ring  . Transthoracic echocardiogram  03/23/2010     Pre-CABG/MVR: EF 25-30%; global HK; Mod-Severe MR  . Tonsillectomy      Childhood  . Nm myoview ltd  04/24/2011    R/L MV - EF 37%; mild perfusion defect due to attenuation w/mild to mod superimposed ischemia seen in apex, apical lateral and distal to mid ateroseptal region; LV systolic fcn moderately reduced; hypotensive BP response to stress w/o chest pain; high risk scan  . Transthoracic echocardiogram  09/2011; 08/2013    a) EF up to 30-40% (post PCI); mild-mod Ant wall HK; Gd 1 DD; MV Annuloplasty ring in place, mild-mod MR; mild PHTN (30-40 mmHg);; b)  EF 35-40% with moderately reduced function. Moderately dilated LV with distal septal-apical, mid and basal inferior akinesis; status post mitral valve ring. Significant diastolic gradient with mild MR. Moderate LA dilation.    . Left heart catheterization with coronary/graft angiogram  07/03/2011    Procedure: LEFT HEART CATHETERIZATION WITH Beatrix Fetters;  Surgeon: Leonie Man, MD;  Location: [facility] CATH LAB;  Service: Cardiovascular;Patent LIMA-LAD, SVG-OM, SVG-RPDA; (native RCA & OM prox 100%, prox LAD into  major D2 ~80% long lesion, midLAD 100%  . Percutaneous coronary stent intervention (pci-s) N/A 08/02/2011    Procedure: PERCUTANEOUS CORONARY STENT INTERVENTION (PCI-S);  Surgeon: Leonie Man, MD;  Location: [facility] CATH LAB;  Service: Cardiovascular;  PCI proximal LAD-D2: Promus Premier DES 2.5 x 38 (tapered post-dilation from 3.0 mm in proximal LAD, 2.75 mm at bifurcation and 2.5 mm in D2)  . Left arm surgery - nos     Prior to Admission medications   Medication Sig Start Date End Date Taking? Authorizing Provider  aspirin EC 81 MG tablet Take 81 mg by mouth daily.    Historical Provider, MD  B Complex-C (SUPER B COMPLEX PO) Take 1 tablet by mouth daily.    Historical Provider, MD  Calcium Carbonate-Vit D-Min (CALCIUM 1200 PO) Take 1,200 mg by mouth daily.    Historical Provider, MD  carvedilol (COREG) 12.5 MG tablet TAKE 0.5-1 TABLETS BY MOUTH 2 (TWO) TIMES DAILY WITH A MEAL. 09/13/14   Leonie Man, MD  cholecalciferol (VITAMIN D) 1000 UNITS  tablet Take 5,000 Units by mouth daily.    Historical Provider, MD  clopidogrel (PLAVIX) 75 MG tablet Take 1 tablet (75 mg total) by mouth daily. 11/22/13   Leonie Man, MD  clopidogrel (PLAVIX) 75 MG tablet Take 1 tablet (75 mg total) by mouth daily. 11/22/13   Leonie Man, MD  Coenzyme Q10 (CO Q-10) 100 MG CAPS Take 100 mg by mouth daily.    Historical Provider, MD  Flaxseed, Linseed, (FLAX SEED OIL) 1000 MG CAPS Take 1 capsule by mouth daily.    Historical Provider, MD  glimepiride (AMARYL) 2 MG tablet Take 4 mg by mouth daily with breakfast.     Historical Provider, MD  lisinopril (PRINIVIL,ZESTRIL) 10 MG tablet TAKE 1 TABLET (10 MG TOTAL) BY MOUTH DAILY. 03/29/14   Leonie Man, MD  magnesium oxide (MAG-OX) 400 MG tablet Take 400 mg by mouth daily.    Historical Provider, MD  metFORMIN (GLUCOPHAGE) 500 MG tablet Take 500 mg by mouth 2 (two) times daily with a meal.  08/11/12   Historical Provider, MD  Multiple Vitamins-Minerals (MULTIVITAMINS  THER. W/MINERALS) TABS Take 1 tablet by mouth daily.    Historical Provider, MD  Omega-3 Fatty Acids (FISH OIL) 1200 MG CAPS Take 2,400 mg by mouth daily.    Historical Provider, MD  simvastatin (ZOCOR) 40 MG tablet Take 1 tablet (40 mg total) by mouth every evening. 09/13/14   Leonie Man, MD  vitamin C (ASCORBIC ACID) 500 MG tablet Take 1,500 mg by mouth daily.    Historical Provider, MD  vitamin E (VITAMIN E) 1000 UNIT capsule Take 1,000 Units by mouth daily.    Historical Provider, MD   Allergies  Allergen Reactions  . Morphine And Related Other (See Comments)    HEADACHE     Social History   Social History  . Marital Status: Single    Spouse Name: Girfriend  . Number of Children: 2  . Years of Education: N/A   Occupational History  .      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 Smoking status: Current Every Day Smoker -- 0.13 packs/day for 50 years    Types: Cigarettes  . Smokeless tobacco: Former Systems developer    Types: Chew     Comment: Quit in 2011 but restarted mid 2012,uses E-cigarettes; Not actually smoking, but does have 2nd hand exposure.  . Alcohol Use: 1.5 oz/week    3 drink(s) per week     Comment: 3-6 Beers per week  . Drug Use: No  . Sexual Activity: Yes   Other Topics Concern  . None   Social History Narrative   He currently lives with girlfriend. He has 2 children from his ex-wife.   Still smokes less than a quarter pack a day. He goes back and forth between having quit in smoking again.   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 He is hoping that he can increase his hours at least one of those jobs and up to to get him back on an insurance plan.      Family History  Problem Relation Age of Onset  . Heart failure Father 81    History of MI & severe CAD; recently deceased.  . Lung cancer Mother   . Cancer Maternal  Grandmother   .  Stroke Maternal Grandfather   . Diabetes Paternal Grandmother   . Diabetes Paternal Grandfather   . Stroke Paternal Grandfather   . Kidney failure Brother     deceased at 22 (was born premature)    Wt Readings from Last 3 Encounters:  01/11/15 176 lb 9.6 oz (80.105 kg)  08/23/13 184 lb 8 oz (83.689 kg)  02/24/13 188 lb 8 oz (85.503 kg)    PHYSICAL EXAM BP 130/80 mmHg  Pulse 77  Ht 5\' 8"  (1.727 m)  Wt 176 lb 9.6 oz (80.105 kg)  BMI 26.86 kg/m2 General appearance: alert, cooperative, appears stated age, no distress and In his normal mood and affect which is somewhat down and depressed. Intermittently jovial. Neck: supple, no LAN, JVD or carotid bruit.  Lungs: CTAB, normal percussion bilaterally and Increased AP diameter and increased expiratory phase. Nonlabored, with good air movement  Heart: RRR, S1, S2 normal, no S3 or S4, systolic murmur: holosystolic 1/6, blowing at apex and no rub  Abdomen: soft, non-tender; bowel sounds normal; no masses, no organomegaly and Mildly obese  Extremities: extremities normal, atraumatic, no cyanosis or edema  Pulses: 2+ and symmetric  Neurologic: Grossly normal    Adult ECG Report  Rate: 77 ;  Rhythm: normal sinus rhythm and Borderline criteria for inferior infarct, age undetermined. Also borderline criteria for anterolateral infarct, age undetermined. He does has early repolarization changes in the septal leads that are relatively stable.;   Narrative Interpretation: no notable change from last EKG.   Other studies Reviewed: Additional studies/ records that were reviewed today include:  Recent Labs:  Last checked by PCP (checks 2 x yearly)   ASSESSMENT / PLAN: Problem List Items Addressed This Visit    Tobacco dependence (Chronic)   S/P MVR (mitral valve repair) (Chronic)    Only mild MRby recent echo. I don't hear much in the way of MR on exam.      Relevant Orders   EKG 12-Lead   S/P CABG x 3    Most  recent ischemic evaluation was by catheterization from 55. Probably, see him back in followup we should be due for a surveillance Myoview.      Relevant Orders   EKG 12-Lead   NSTEMI (non-ST elevated myocardial infarction) (Chronic)    Interestingly, his ACS presentation was more acute or that was angina. He is not really having any active anginal symptoms or heart failure symptoms. He is very active and recovered relatively well.      Relevant Orders   EKG 12-Lead   Ischemic Cardiomyopathy - EF ~30-40%; Class II CHF - Primary (Chronic)    Really the only class 1-2 heart failuresymptoms. Stable to mildly improved EF by recent echo. Thankfully the mitral regurgitation has improved. He is on ACE inhibitor and tolerating it well. He sought treatment regimen overall not requiring a diuretic. I think that he is in  Category would benefit potentially from switch him from ACE inhibitor to [facility]. As usual, Trajan is not always very agreeable to do things. He would like to think about it and review some information on-line. I think is perfectly reasonable. I would like to do is have him see Tommy Medal [facility] (our clinical pharmacist) to discuss pros and cons of it and just how we do the transition from ACE inhibitor to [facility]. One date and she will be cost and availability.      Relevant Orders   EKG 12-Lead   Hyperlipidemia with target LDL less than 70 (  Chronic)    He is on statin and will be monitored by his PCP. Reviewing the importance of ensuring that his levels are less than 70 LDL.      Relevant Orders   EKG 12-Lead   Essential hypertension (Chronic)    Pretty much well controlled. He is on half dose of ACE inhibitor which will allow for consideration of using contrast.      Relevant Orders   EKG 12-Lead   CAD - s/p CABG x 17 Mar 2010. Abnormal Myoview-LAD & D1 disease 06/2011 Rx'd with Promus DES (Chronic)    Long area of stented region in the proximal LAD into the diagonal  branch. Otherwise present Is revascularizable to the other territory. The significance of his native disease  The graft disease less likely. Remains on Plavix without aspirin. Stable dose of carvedilol he is taking a half tablet during the day. At night. Is also on stable dose of lisinopril and simvastatin.      Relevant Orders   EKG 12-Lead      Current medicines are reviewed at length with the patient today. (+/- concerns) none The following changes have been made: none Studies Ordered:   Orders Placed This Encounter  Procedures  . EKG 12-Lead      Leonie Man, M.D., M.S. Interventional Cardiologist   Pager # [phone]

## 2015-01-11 ENCOUNTER — Ambulatory Visit (INDEPENDENT_AMBULATORY_CARE_PROVIDER_SITE_OTHER): Payer: 59 | Admitting: Cardiology

## 2015-01-11 VITALS — BP 130/80 | HR 77 | Ht 68.0 in | Wt 176.6 lb

## 2015-01-11 DIAGNOSIS — I214 Non-ST elevation (NSTEMI) myocardial infarction: Secondary | ICD-10-CM | POA: Diagnosis not present

## 2015-01-11 DIAGNOSIS — I2510 Atherosclerotic heart disease of native coronary artery without angina pectoris: Secondary | ICD-10-CM | POA: Diagnosis not present

## 2015-01-11 DIAGNOSIS — I10 Essential (primary) hypertension: Secondary | ICD-10-CM | POA: Diagnosis not present

## 2015-01-11 DIAGNOSIS — Z9889 Other specified postprocedural states: Secondary | ICD-10-CM

## 2015-01-11 DIAGNOSIS — Z9861 Coronary angioplasty status: Secondary | ICD-10-CM

## 2015-01-11 DIAGNOSIS — E785 Hyperlipidemia, unspecified: Secondary | ICD-10-CM

## 2015-01-11 DIAGNOSIS — Z951 Presence of aortocoronary bypass graft: Secondary | ICD-10-CM

## 2015-01-11 DIAGNOSIS — I255 Ischemic cardiomyopathy: Secondary | ICD-10-CM | POA: Diagnosis not present

## 2015-01-11 DIAGNOSIS — F17200 Nicotine dependence, unspecified, uncomplicated: Secondary | ICD-10-CM

## 2015-01-11 NOTE — Patient Instructions (Signed)
Your physician recommends that you schedule a follow-up appointment in [place] physician wants you to follow-up in [place]. You will receive a reminder letter in the mail two months in advance. If you don't receive a letter, please call our office to schedule the follow-up appointment.

## 2015-01-13 ENCOUNTER — Encounter: Payer: Self-pay | Admitting: Cardiology

## 2015-01-13 NOTE — Assessment & Plan Note (Signed)
Most recent ischemic evaluation was by catheterization from 67. Probably, see him back in followup we should be due for a surveillance Myoview.

## 2015-01-13 NOTE — Assessment & Plan Note (Signed)
Pretty much well controlled. He is on half dose of ACE inhibitor which will allow for consideration of using contrast.

## 2015-01-13 NOTE — Assessment & Plan Note (Signed)
Long area of stented region in the proximal LAD into the diagonal branch. Otherwise present Is revascularizable to the other territory. The significance of his native disease  The graft disease less likely. Remains on Plavix without aspirin. Stable dose of carvedilol he is taking a half tablet during the day. At night. Is also on stable dose of lisinopril and simvastatin.

## 2015-01-13 NOTE — Assessment & Plan Note (Signed)
Interestingly, his ACS presentation was more acute or that was angina. He is not really having any active anginal symptoms or heart failure symptoms. He is very active and recovered relatively well.

## 2015-01-13 NOTE — Assessment & Plan Note (Signed)
Only mild MRby recent echo. I don't hear much in the way of MR on exam.

## 2015-01-13 NOTE — Assessment & Plan Note (Signed)
He is on statin and will be monitored by his PCP. Reviewing the importance of ensuring that his levels are less than 70 LDL.

## 2015-01-13 NOTE — Assessment & Plan Note (Signed)
Really the only class 1-2 heart failuresymptoms. Stable to mildly improved EF by recent echo. Thankfully the mitral regurgitation has improved. He is on ACE inhibitor and tolerating it well. He sought treatment regimen overall not requiring a diuretic. I think that he is in  Category would benefit potentially from switch him from ACE inhibitor to [facility]. As usual, Trong is not always very agreeable to do things. He would like to think about it and review some information on-line. I think is perfectly reasonable. I would like to do is have him see Tommy Medal [facility] (our clinical pharmacist) to discuss pros and cons of it and just how we do the transition from ACE inhibitor to [facility]. One date and she will be cost and availability.

## 2015-02-01 ENCOUNTER — Other Ambulatory Visit: Payer: Self-pay | Admitting: Cardiology

## 2015-02-02 ENCOUNTER — Ambulatory Visit: Payer: Self-pay | Admitting: Pharmacist Clinician (PhC)/ Clinical Pharmacy Specialist

## 2015-03-13 ENCOUNTER — Other Ambulatory Visit: Payer: Self-pay | Admitting: Cardiology

## 2015-03-13 NOTE — Telephone Encounter (Signed)
Rx request sent to pharmacy.  

## 2015-05-05 ENCOUNTER — Other Ambulatory Visit: Payer: Self-pay | Admitting: *Deleted

## 2015-05-08 ENCOUNTER — Other Ambulatory Visit: Payer: Self-pay | Admitting: *Deleted

## 2015-05-08 ENCOUNTER — Telehealth: Payer: Self-pay | Admitting: Cardiology

## 2015-05-08 MED ORDER — CARVEDILOL 12.5 MG PO TABS: ORAL_TABLET | ORAL | Status: DC

## 2015-05-08 NOTE — Telephone Encounter (Signed)
Refill sent to the pharmacy electronically.  

## 2015-05-08 NOTE — Telephone Encounter (Signed)
°*  STAT* If patient is at the pharmacy, call can be transferred to refill team.   1. Which medications need to be refilled? (please list name of each medication and dose if known) Carvedilol  2. Which pharmacy/location (including street and city if local pharmacy) is medication to be sent to [facility] on [address]   3. Do they need a 30 day or 90 day supply? [place]

## 2015-11-09 ENCOUNTER — Other Ambulatory Visit: Payer: Self-pay | Admitting: Cardiology

## 2015-11-09 NOTE — Telephone Encounter (Signed)
REFILL 

## 2015-12-26 ENCOUNTER — Other Ambulatory Visit: Payer: Self-pay | Admitting: Cardiology

## 2016-02-07 ENCOUNTER — Encounter: Payer: Self-pay | Admitting: Cardiology

## 2016-02-07 ENCOUNTER — Ambulatory Visit (INDEPENDENT_AMBULATORY_CARE_PROVIDER_SITE_OTHER): Payer: BLUE CROSS/BLUE SHIELD | Admitting: Cardiology

## 2016-02-07 VITALS — BP 138/70 | HR 79 | Ht 68.0 in | Wt 169.2 lb

## 2016-02-07 DIAGNOSIS — Z9889 Other specified postprocedural states: Secondary | ICD-10-CM

## 2016-02-07 DIAGNOSIS — E785 Hyperlipidemia, unspecified: Secondary | ICD-10-CM

## 2016-02-07 DIAGNOSIS — F17200 Nicotine dependence, unspecified, uncomplicated: Secondary | ICD-10-CM

## 2016-02-07 DIAGNOSIS — I10 Essential (primary) hypertension: Secondary | ICD-10-CM | POA: Diagnosis not present

## 2016-02-07 DIAGNOSIS — I2510 Atherosclerotic heart disease of native coronary artery without angina pectoris: Secondary | ICD-10-CM

## 2016-02-07 DIAGNOSIS — E119 Type 2 diabetes mellitus without complications: Secondary | ICD-10-CM

## 2016-02-07 DIAGNOSIS — I255 Ischemic cardiomyopathy: Secondary | ICD-10-CM | POA: Diagnosis not present

## 2016-02-07 DIAGNOSIS — I214 Non-ST elevation (NSTEMI) myocardial infarction: Secondary | ICD-10-CM

## 2016-02-07 DIAGNOSIS — Z9861 Coronary angioplasty status: Secondary | ICD-10-CM

## 2016-02-07 DIAGNOSIS — Z951 Presence of aortocoronary bypass graft: Secondary | ICD-10-CM

## 2016-02-07 DIAGNOSIS — R0989 Other specified symptoms and signs involving the circulatory and respiratory systems: Secondary | ICD-10-CM

## 2016-02-07 LAB — LIPID PANEL
CHOL/HDL RATIO: 4 ratio (ref <=5.0)
CHOLESTEROL: 144 mg/dL (ref 125–200)
HDL: 36 mg/dL — ABNORMAL LOW (ref >=40)
LDL Cholesterol: 90 mg/dL (ref <130)
TRIGLYCERIDES: 92 mg/dL (ref <150)
VLDL: 18 mg/dL (ref <30)

## 2016-02-07 LAB — COMPREHENSIVE METABOLIC PANEL
ALK PHOS: 109 U/L (ref 40–115)
ALT: 16 U/L (ref 9–46)
AST: 13 U/L (ref 10–35)
Albumin: 4.3 g/dL (ref 3.6–5.1)
BILIRUBIN TOTAL: 0.5 mg/dL (ref 0.2–1.2)
BUN: 19 mg/dL (ref 7–25)
CALCIUM: 9.1 mg/dL (ref 8.6–10.3)
CO2: 25 mmol/L (ref 20–31)
Chloride: 103 mmol/L (ref 98–110)
Creat: 0.76 mg/dL (ref 0.70–1.25)
GLUCOSE: 304 mg/dL — AB (ref 65–99)
POTASSIUM: 5.1 mmol/L (ref 3.5–5.3)
Sodium: 135 mmol/L (ref 135–146)
TOTAL PROTEIN: 6.6 g/dL (ref 6.1–8.1)

## 2016-02-07 NOTE — Progress Notes (Signed)
PCP:  Does not currently have a PCP. Former PCP has died.  Clinic Note: Chief Complaint  Patient presents with  . Coronary Artery Disease    Status post CABG after large MI  . Cardiomyopathy    No active heart failure    HPI: Eric Bradshaw is a 60 y.o. male with a PMH below who presents today for Annual follow-up of his extensive CAD-CABG with ischemic cardiomyopathy.  Cardiac History:  NSTEMI with Acute CHF - Dec 2011: Cath with severe/diffuse multivessel CAD, EF ~20% --> CABGx3-MVR (LIMA-mLAD, SVG-OM, SVG-rPDA) ->mild improvement in EF post CABG   For persistently reduced LVEF -> Cath 06/2011, patent grafts with severe proximal  LAD - D1 lesions --   07/2011: PCI  proxLAD-into D2 with Promus Premier DES 2.5 mm x 38 mm (distal in D2 2.5 mm, @ bifurcation - 2.75 m, in prox LAD ~3.0 mm  EF Improved to 35-40% (still not high enough to renew CDL) Echo 08/2013: EF 35-40% with moderately reduced function. Moderately dilated LV with distal septal-apical, mid and basal inferior akinesis; status post mitral valve ring. Significant diastolic gradient with mild MR. Moderate LA dilation  Has done well with no CHF or angina Sx.  Eric Bradshaw was last seen in Sept 2016 - doing well.  Recent Hospitalizations: n/a  Studies Reviewed: n/a  Interval History: Eric Bradshaw presents today for routine follow-up doing quite well. He is as usual he minimizes things, but is very forthcoming with how active he is. He did a full month worth of work building a back deck at this house cutting down the trees and routes by hand. Lots of exertional activity. 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 He does yard work with heavy lifting etc. He really has not had any resting or exertional chest tightness or pressure. No dyspnea. No PND, orthopnea or edema.  Other cardiovascular review of symptoms as follows: No palpitations, lightheadedness,  dizziness, weakness or syncope/near syncope. No TIA/amaurosis fugax symptoms. No melena, hematochezia, hematuria, or epstaxis. No claudication.  He actually does not buy cigarettes, has not smoked routinely for quite some time. He may "bumb a cigarette "every now and then if he is a little bit stressed out"  He got frustrated have not heard back from his PCP (not realizing that his PCP actually was sick himself and passed away). He suddenly has run out of his diabetes medicines including metformin and glimepiride. He has not been taking them for several months.   ROS: A comprehensive was performed. Review of Systems  Constitutional: Positive for weight loss (He has been paying attention to his diet and has been exercising a lot more). Negative for malaise/fatigue.  HENT: Negative for congestion and nosebleeds.   Respiratory: Negative for cough, shortness of breath and wheezing.   Cardiovascular: Negative.  Negative for claudication.       Per history of present illness  Gastrointestinal: Negative for blood in stool, constipation, heartburn and melena.  Genitourinary: Negative for hematuria.  Musculoskeletal: Positive for joint pain (If he overexerts himself). Negative for myalgias.  Skin: Negative.   Neurological: Negative for dizziness, loss of consciousness and headaches.  Endo/Heme/Allergies: Negative for environmental allergies. Does not bruise/bleed easily.  Psychiatric/Behavioral: Negative for depression and memory loss. The patient is not nervous/anxious and does not have insomnia.   All other systems reviewed and are negative.   Past Medical History:  Diagnosis Date  .  CAD S/P percutaneous coronary angioplasty 03/2010 -- 3-07/2011   Proximal LAD ~80% (into D2) & mid - subtotal occlusion , RCA occlusion, LCx occlusion -- s/p CABG x 3  (LIMA-LAD, SVG-OM, SVG-RPDA); Cath 06/2011, patent grafts with severe proximal  LAD - D1 lesions -- 07/2011: PCI  proxLAD-into D2 with Promus Premier  DES 2.5 mm x 38 mm (distal in D2 2.5 mm, @ bifurcation - 2.75 m, in prox LAD ~3.0 mm  . Chronic combined systolic and diastolic HF (heart failure), NYHA class 2 ([place]) 03/2010; 08/2103   a) EF ~30-30%; Exertional Dyspnea; No exaccerbations.;; b)EF 35-40%, Mod LA dilation  . COPD (chronic obstructive pulmonary disease) ([place])   . DM (diabetes mellitus) type II controlled peripheral vascular disorder ([place]) 2013   & CAD  . HTN, goal below 130/80 03/2010  . Hyperlipidemia LDL goal < 70   . Ischemic cardiomyopathy 03/2010; May 2015   a) Echo - EF 30-40%; mild/mod anterior wall hypokinesis; doppler flow suggestive of impaired LV relaxation; LA moderately dilated; annuloplasty ring noted in mitral position; mild/mod mitral regurgitation; RVsystolic pressure elevated at 30-62mmHg;;; b) Echo May 2015: EF 35-40%  . NSTEMI (non-ST elevated myocardial infarction) ([place]) [id number]   Complicated by Severe Acute Systolic CHF -- Class IV CHF,  . S/P CABG x 3 03/2010   LIMA-mLAD, SVG-OM, SVG-rPDA  . S/P MVR (mitral valve repair) 03/2010   @ time of CABG for ischemic MR - 26 mm Edwards physio-2 annuloplasty ring  . Tobacco dependence    Initially quit - restarted ~mid 2012; now essentially quit.    Past Surgical History:  Procedure Laterality Date  . CORONARY ARTERY BYPASS GRAFT  03/2010   LIMA-LAD, SVG-OM, SVG-RPDA  . Left Arm Surgery - NOS    . LEFT HEART CATHETERIZATION WITH CORONARY/GRAFT ANGIOGRAM  07/03/2011   Procedure: LEFT HEART CATHETERIZATION WITH Beatrix Fetters;  Surgeon: Leonie Man, MD;  Location: [facility] CATH LAB;  Service: Cardiovascular;Patent LIMA-LAD, SVG-OM, SVG-RPDA; (native RCA & OM prox 100%, prox LAD into major D2 ~80% long lesion, midLAD 100%  . MITRAL VALVULOPLASTY  03/2010   conjuntive with CABG for Severe MR; 26 mm Edwards physio-2 annuloplasty ring  . NM MYOVIEW LTD  04/24/2011   R/L MV - EF 37%; mild perfusion defect due to attenuation w/mild to mod superimposed ischemia  seen in apex, apical lateral and distal to mid ateroseptal region; LV systolic fcn moderately reduced; hypotensive BP response to stress w/o chest pain; high risk scan  . PERCUTANEOUS CORONARY STENT INTERVENTION (PCI-S) N/A 08/02/2011   Procedure: PERCUTANEOUS CORONARY STENT INTERVENTION (PCI-S);  Surgeon: Leonie Man, MD;  Location: [facility] CATH LAB;  Service: Cardiovascular;  PCI proximal LAD-D2: Promus Premier DES 2.5 x 38 (tapered post-dilation from 3.0 mm in proximal LAD, 2.75 mm at bifurcation and 2.5 mm in D2)  . TONSILLECTOMY     Childhood  . TRANSTHORACIC ECHOCARDIOGRAM  03/23/2010    Pre-CABG/MVR: EF 25-30%; global HK; Mod-Severe MR  . TRANSTHORACIC ECHOCARDIOGRAM  09/2011; 08/2013   a) EF up to 30-40% (post PCI); mild-mod Ant wall HK; Gd 1 DD; MV Annuloplasty ring in place, mild-mod MR; mild PHTN (30-40 mmHg);; b)  EF 35-40% with moderately reduced function. Moderately dilated LV with distal septal-apical, mid and basal inferior akinesis; status post mitral valve ring. Significant diastolic gradient with mild MR. Moderate LA dilation.      Prior to Admission medications   Medication Sig Start Date End Date Taking? Authorizing Provider  aspirin EC 81 MG  tablet Take 81 mg by mouth daily.   Yes Historical Provider, MD  B Complex-C (SUPER B COMPLEX PO) Take 1 tablet by mouth daily.   Yes Historical Provider, MD  Calcium Carbonate-Vit D-Min (CALCIUM 1200 PO) Take 1,200 mg by mouth daily.   Yes Historical Provider, MD  carvedilol (COREG) 12.5 MG tablet TAKE 1/2 TO 1 TABLET BY MOUTH TWICE DAILY WITH FOOD 05/08/15  Yes Leonie Man, MD  cholecalciferol (VITAMIN D) 1000 UNITS tablet Take 5,000 Units by mouth daily.   Yes Historical Provider, MD  clopidogrel (PLAVIX) 75 MG tablet Take 1 tablet (75 mg total) by mouth daily. 11/22/13  Yes Leonie Man, MD  clopidogrel (PLAVIX) 75 MG tablet TAKE 1 TABLET (75 MG TOTAL) BY MOUTH DAILY. 02/01/15  Yes Leonie Man, MD  Coenzyme Q10 (CO Q-10) 100 MG  CAPS Take 100 mg by mouth daily.   Yes Historical Provider, MD  Flaxseed, Linseed, (FLAX SEED OIL) 1000 MG CAPS Take 1 capsule by mouth daily.   Yes Historical Provider, MD  lisinopril (PRINIVIL,ZESTRIL) 10 MG tablet TAKE 1 TABLET BY MOUTH DAILY 03/13/15  Yes Leonie Man, MD  magnesium oxide (MAG-OX) 400 MG tablet Take 400 mg by mouth daily.   Yes Historical Provider, MD  Multiple Vitamins-Minerals (MULTIVITAMINS THER. W/MINERALS) TABS Take 1 tablet by mouth daily.   Yes Historical Provider, MD  Omega-3 Fatty Acids (FISH OIL) 1200 MG CAPS Take 2,400 mg by mouth daily.   Yes Historical Provider, MD  simvastatin (ZOCOR) 40 MG tablet TAKE 1 TABLET(40 MG) BY MOUTH EVERY EVENING 12/26/15  Yes Leonie Man, MD  vitamin C (ASCORBIC ACID) 500 MG tablet Take 1,500 mg by mouth daily.   Yes Historical Provider, MD  vitamin E (VITAMIN E) 1000 UNIT capsule Take 1,000 Units by mouth daily.   Yes Historical Provider, MD     Allergies  Allergen Reactions  . Morphine And Related Other (See Comments)    HEADACHE    Social History   Social History  . Marital status: Single    Spouse name: Girfriend  . Number of children: 2  . Years of education: N/A   Occupational History  .  Rti    Currently on FMLA from [employer]  due to inability to qualify for CDL due to EF < 40%   Social History Main Topics  . Smoking status: Current Every Day Smoker    Packs/day: 0.13    Years: 50.00    Types: Cigarettes  . Smokeless tobacco: Former Systems developer    Types: Chew     Comment: Quit in 2011 but restarted mid 2012,uses E-cigarettes; Not actually smoking, but does have 2nd hand exposure.  . Alcohol use 1.5 oz/week    3 drink(s) per week     Comment: 3-6 Beers per week  . Drug use: No  . Sexual activity: Yes   Other Topics Concern  . None   Social History Narrative   He currently lives with girlfriend. He has 2 children from his ex-wife.   Still smokes less than a quarter pack a day. He goes back and forth  between having quit in smoking again.   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 He is hoping that he can increase his hours at least one of those jobs and up to to get him back on an insurance plan.       Family History  Problem Relation Age of Onset  . Heart failure Father 38    History of MI & severe CAD; recently deceased.  . Lung cancer Mother   . Cancer Maternal Grandmother   . Stroke Maternal Grandfather   . Diabetes Paternal Grandmother   . Diabetes Paternal Grandfather   . Stroke Paternal Grandfather   . Kidney failure Brother     deceased at 43 (was born premature)    Wt Readings from Last 3 Encounters:  02/07/16 76.7 kg (169 lb 3.2 oz)  01/11/15 80.1 kg (176 lb 9.6 oz)  08/23/13 83.7 kg (184 lb 8 oz)    PHYSICAL EXAM BP 138/70   Pulse 79   Ht 5\' 8"  (1.727 m)   Wt 76.7 kg (169 lb 3.2 oz)   BMI 25.73 kg/m  General appearance: alert, cooperative, appears stated age, no distress and In his normal mood and affect which is somewhat down and depressed. Intermittently jovial. Neck: supple, no LAN, JVD - Soft R carotid bruit.  Lungs: CTAB, normal percussion bilaterally and Increased AP diameter and increased expiratory phase. Nonlabored, with good air movement  Heart: RRR, S1, S2 normal, no S3 or S4, systolic murmur: holosystolic 1/6, blowing at apex and no rub  Abdomen: soft, non-tender; bowel sounds normal; no masses, no organomegaly and Mildly obese  Extremities: extremities normal, atraumatic, no cyanosis or edema  Pulses: 2+ and symmetric  Neurologic: Grossly normal   Adult ECG Report  Rate: 79 ;  Rhythm: normal sinus rhythm; septal-lateral-inferior MI, age undetermined. Stable ST changes.  Narrative Interpretation: Stable EKG   Other studies Reviewed: Additional studies/ records that were reviewed today include:  Recent Labs:  Due for Labs. We will also check hemoglobin  A1c   ASSESSMENT / PLAN: Problem List Items Addressed This Visit    Tobacco dependence (Chronic)    He pretty much is all but quit only smoking an occasional cigarette every now and then.      S/P MVR (mitral valve repair) (Chronic)    Soft murmur consistent with mild MR. If this gets worse, we would need to recheck an echo.      Relevant Orders   VAS US CAROTID   Lipid panel (Completed)   Comprehensive metabolic panel (Completed)   Hemoglobin A1c (Completed)   S/P CABG x 3   Right carotid bruit    I may have heard this bruit in the past. But we have not evaluated his carotids in a while. We will then order a relook the carotid Dopplers.      Relevant Orders   VAS US CAROTID   Lipid panel (Completed)   Comprehensive metabolic panel (Completed)   Hemoglobin A1c (Completed)   NSTEMI (non-ST elevated myocardial infarction) ([place]) (Chronic)    He had a large infarct with acute systolic heart failure class IV back in 2011. Has not had any symptoms like that since. He had a Myoview in 2 and 2013 that showed an apical defect. We will see how he fares with this new stress test. I suspect that there will be an infarct noted based on the fact that he has hypokinesis on his echocardiogram in the inferior wall as well as distal apical septal.      Ischemic Cardiomyopathy - EF ~30-40%; Class II CHF - Primary (Chronic)    Amazingly only class I heart failure symptoms. He has not had any heart failure symptoms since he was in the hospital back in 2011. He is on an ACE inhibitor  and carvedilol. Not requiring any diuretic.  At this point he is not really instituted discussing Entresto. He feels like things are stable and he is doing well.      Relevant Orders   VAS US CAROTID   Lipid panel (Completed)   Comprehensive metabolic panel (Completed)   Hemoglobin A1c (Completed)   Hyperlipidemia with target LDL less than 70 (Chronic)    On statin. No labs checked recently. We will check fasting  lipid panel as well. He has not eaten today. For now continue current dose statin.      Relevant Orders   VAS US CAROTID   Lipid panel (Completed)   Comprehensive metabolic panel (Completed)   Hemoglobin A1c (Completed)   Essential hypertension (Chronic)    Pretty well-controlled on his ACE inhibitor and carvedilol. If we try to titrate him up further he does get symptomatic and will cut them back anyway.      Relevant Orders   VAS US CAROTID   Lipid panel (Completed)   Comprehensive metabolic panel (Completed)   Hemoglobin A1c (Completed)   Diabetes mellitus type 2 in nonobese (HCC) (Chronic)    He has been off his medications for a while. I'll check a hemoglobin A1c along with his lipid panel and chemistries today. Had a feeling that we probably need to restart his metformin and glimepiride. Based on these results with contacted to have been reordered.  He will need a new PCP.      Relevant Orders   Lipid panel (Completed)   Comprehensive metabolic panel (Completed)   Hemoglobin A1c (Completed)   CAD - s/p CABG x 17 Mar 2010. Abnormal Myoview-LAD & D1 disease 06/2011 Rx'd with Promus DES (Chronic)    He has a long stented segment from the proximal LAD into the diagonal branch along with his grafts. This revascularize a large portion of the anterolateral wall. Had definite improvement in EF. He is doing wonderfully with no active anginal or heart failure symptoms. He is due however for stress test. We will plan a treadmill Myoview for routine follow-up -- he went quite a long time with the significant disease of the did have prior to his initial presentation. Therefore we don't know how much she is minimizing.  Continue Plavix, simvastatin, carvedilol and lisinopril. He is taking the carvedilol 1 tablet twice a day.      Relevant Orders   VAS US CAROTID   Lipid panel (Completed)   Comprehensive metabolic panel (Completed)   Hemoglobin A1c (Completed)    Other Visit Diagnoses    None.     Current medicines are reviewed at length with the patient today. (+/- concerns) none The following changes have been made: none  Patient Instructions  NEED LABS - CMP ,LIPID Hgb AIC    Your physician has requested that you have a carotid duplex. This test is an ultrasound of the carotid arteries in your neck. It looks at blood flow through these arteries that supply the brain with blood. Allow one hour for this exam. There are no restrictions or special instructions.   Your physician wants you to follow-up in:12 MONTHS WITH DR Daejon Lich. You will receive a reminder letter in the mail two months in advance. If you don't receive a letter, please call our office to schedule the follow-up appointment.  If you need a refill on your cardiac medications before your next appointment, please call your pharmacy.    Studies Ordered:   Orders Placed This Encounter  Procedures  .  Lipid panel  . Comprehensive metabolic panel  . Hemoglobin A1c      Glenetta Hew, M.D., M.S. Interventional Cardiologist   Pager # [phone] Phone # [phone] [address] [place], [place] [address]

## 2016-02-07 NOTE — Patient Instructions (Addendum)
NEED LABS - CMP ,LIPID Hgb AIC    Your physician has requested that you have a carotid duplex. This test is an ultrasound of the carotid arteries in your neck. It looks at blood flow through these arteries that supply the brain with blood. Allow one hour for this exam. There are no restrictions or special instructions.   Your physician wants you to follow-up in:12 MONTHS WITH DR HARDING. You will receive a reminder letter in the mail two months in advance. If you don't receive a letter, please call our office to schedule the follow-up appointment.  If you need a refill on your cardiac medications before your next appointment, please call your pharmacy.

## 2016-02-08 ENCOUNTER — Telehealth: Payer: Self-pay | Admitting: *Deleted

## 2016-02-08 ENCOUNTER — Encounter: Payer: Self-pay | Admitting: Cardiology

## 2016-02-08 DIAGNOSIS — E119 Type 2 diabetes mellitus without complications: Secondary | ICD-10-CM

## 2016-02-08 DIAGNOSIS — Z5189 Encounter for other specified aftercare: Secondary | ICD-10-CM

## 2016-02-08 LAB — HEMOGLOBIN A1C
HEMOGLOBIN A1C: 9.6 % — AB (ref <5.7)
Mean Plasma Glucose: 229 mg/dL

## 2016-02-08 NOTE — Assessment & Plan Note (Signed)
He has a long stented segment from the proximal LAD into the diagonal branch along with his grafts. This revascularize a large portion of the anterolateral wall. Had definite improvement in EF. He is doing wonderfully with no active anginal or heart failure symptoms. He is due however for stress test. We will plan a treadmill Myoview for routine follow-up -- he went quite a long time with the significant disease of the did have prior to his initial presentation. Therefore we don't know how much she is minimizing.  Continue Plavix, simvastatin, carvedilol and lisinopril. He is taking the carvedilol 1 tablet twice a day.

## 2016-02-08 NOTE — Assessment & Plan Note (Signed)
On statin. No labs checked recently. We will check fasting lipid panel as well. He has not eaten today. For now continue current dose statin.

## 2016-02-08 NOTE — Telephone Encounter (Signed)
Left message to call back- in regards to lab work , patient to restart diabetic medications

## 2016-02-08 NOTE — Assessment & Plan Note (Signed)
He pretty much is all but quit only smoking an occasional cigarette every now and then.

## 2016-02-08 NOTE — Assessment & Plan Note (Signed)
Pretty well-controlled on his ACE inhibitor and carvedilol. If we try to titrate him up further he does get symptomatic and will cut them back anyway.

## 2016-02-08 NOTE — Assessment & Plan Note (Signed)
I may have heard this bruit in the past. But we have not evaluated his carotids in a while. We will then order a relook the carotid Dopplers.

## 2016-02-08 NOTE — Assessment & Plan Note (Signed)
He had a large infarct with acute systolic heart failure class IV back in 2011. Has not had any symptoms like that since. He had a Myoview in 2 and 2013 that showed an apical defect. We will see how he fares with this new stress test. I suspect that there will be an infarct noted based on the fact that he has hypokinesis on his echocardiogram in the inferior wall as well as distal apical septal.

## 2016-02-08 NOTE — Assessment & Plan Note (Signed)
Amazingly only class I heart failure symptoms. He has not had any heart failure symptoms since he was in the hospital back in 2011. He is on an ACE inhibitor and carvedilol. Not requiring any diuretic.  At this point he is not really instituted discussing Entresto. He feels like things are stable and he is doing well.

## 2016-02-08 NOTE — Assessment & Plan Note (Signed)
Soft murmur consistent with mild MR. If this gets worse, we would need to recheck an echo.

## 2016-02-08 NOTE — Telephone Encounter (Signed)
-----   Message from Leonie Man, MD sent at 02/08/2016  8:05 AM EDT ----- Hemoglobin A1c has gone up to 9.6. We should at least restart metformin 500 twice a day as well as the glimeperide 2mg  that you were on before. You really need to establish a new PCP. Overall, the cholesterol has improved since last visit. LDL is down to 90 with total cholesterol down to 144. -- Goal LDL is <70.  Will recheck along with A1c in 3 months (after restarting Diabetes meds).  If LDL not at goal, will change from simvastatin to atorvastatin. Chemistry panel looks pretty normal with the exception of elevated blood sugar levels. Kidney function and liver function are stable. Potassium is borderline high, but stable. -- Recheck BMP also in 3 months.   Glenetta Hew, M.D., M.S. Interventional Cardiologist   Pager # [phone] Phone # [phone] [address] [place], [place] [address]

## 2016-02-08 NOTE — Assessment & Plan Note (Signed)
He has been off his medications for a while. I'll check a hemoglobin A1c along with his lipid panel and chemistries today. Had a feeling that we probably need to restart his metformin and glimepiride. Based on these results with contacted to have been reordered.  He will need a new PCP.

## 2016-02-12 DIAGNOSIS — E119 Type 2 diabetes mellitus without complications: Secondary | ICD-10-CM | POA: Insufficient documentation

## 2016-02-12 MED ORDER — METFORMIN HCL 500 MG PO TABS: 500.0000 mg | ORAL_TABLET | Freq: Two times a day (BID) | ORAL | 2 refills | Status: DC

## 2016-02-12 MED ORDER — GLIMEPIRIDE 2 MG PO TABS: 2.0000 mg | ORAL_TABLET | Freq: Every day | ORAL | 2 refills | Status: DC

## 2016-02-12 NOTE — Telephone Encounter (Signed)
Spoke to patient. Result given . Verbalized understanding Will mail lab slip in 3 months  Prescription sent [facility] [place] Patient is aware to search and establish with a new primary doctor.

## 2016-02-16 NOTE — Addendum Note (Signed)
Addended by: Leland Johns A on: 02/16/2016 02:53 PM   Modules accepted: Orders

## 2016-02-21 ENCOUNTER — Ambulatory Visit (HOSPITAL_COMMUNITY)
Admission: RE | Admit: 2016-02-21 | Discharge: 2016-02-21 | Disposition: A | Discharge status: Home / Self Care | Payer: BLUE CROSS/BLUE SHIELD | Source: Ambulatory Visit | Attending: Cardiovascular Disease | Admitting: Cardiovascular Disease

## 2016-02-21 DIAGNOSIS — Z9861 Coronary angioplasty status: Secondary | ICD-10-CM | POA: Insufficient documentation

## 2016-02-21 DIAGNOSIS — E785 Hyperlipidemia, unspecified: Secondary | ICD-10-CM | POA: Diagnosis not present

## 2016-02-21 DIAGNOSIS — I255 Ischemic cardiomyopathy: Secondary | ICD-10-CM | POA: Diagnosis not present

## 2016-02-21 DIAGNOSIS — E119 Type 2 diabetes mellitus without complications: Secondary | ICD-10-CM | POA: Diagnosis not present

## 2016-02-21 DIAGNOSIS — I10 Essential (primary) hypertension: Secondary | ICD-10-CM

## 2016-02-21 DIAGNOSIS — R0989 Other specified symptoms and signs involving the circulatory and respiratory systems: Secondary | ICD-10-CM | POA: Diagnosis present

## 2016-02-21 DIAGNOSIS — Z951 Presence of aortocoronary bypass graft: Secondary | ICD-10-CM | POA: Insufficient documentation

## 2016-02-21 DIAGNOSIS — Z720 Tobacco use: Secondary | ICD-10-CM | POA: Diagnosis not present

## 2016-02-21 DIAGNOSIS — I2510 Atherosclerotic heart disease of native coronary artery without angina pectoris: Secondary | ICD-10-CM | POA: Diagnosis not present

## 2016-02-21 DIAGNOSIS — I6523 Occlusion and stenosis of bilateral carotid arteries: Secondary | ICD-10-CM | POA: Insufficient documentation

## 2016-02-21 DIAGNOSIS — Z9889 Other specified postprocedural states: Secondary | ICD-10-CM | POA: Diagnosis not present

## 2016-02-22 ENCOUNTER — Telehealth: Payer: Self-pay | Admitting: *Deleted

## 2016-02-22 NOTE — Telephone Encounter (Signed)
Spoke to patient. Result given . Verbalized understanding Patient states he found a new PRIMARY DR [facility] on [address], [place] Will send results,and lab

## 2016-02-22 NOTE — Telephone Encounter (Signed)
Left message to cal back in regards to carotid doppler results

## 2016-02-22 NOTE — Telephone Encounter (Signed)
-----   Message from Leonie Man, MD sent at 02/21/2016 11:21 PM EST ----- Stable mild to moderate carotid disease bilaterally. Plan as usual is to follow-up in one year.  Glenetta Hew, MD

## 2016-04-11 ENCOUNTER — Telehealth: Payer: Self-pay | Admitting: *Deleted

## 2016-04-11 DIAGNOSIS — Z5189 Encounter for other specified aftercare: Secondary | ICD-10-CM

## 2016-04-11 DIAGNOSIS — E119 Type 2 diabetes mellitus without complications: Secondary | ICD-10-CM

## 2016-04-11 NOTE — Telephone Encounter (Signed)
-----   Message from Raiford Simmonds, RN sent at 02/12/2016  6:10 PM EDT ----- Need labs jan 2018 Bmp, hgb a1c Mail in [phone]

## 2016-04-11 NOTE — Telephone Encounter (Signed)
Mail letter and labslip  

## 2016-04-26 ENCOUNTER — Other Ambulatory Visit: Payer: Self-pay | Admitting: Cardiology

## 2016-04-26 NOTE — Telephone Encounter (Signed)
Rx(s) sent to pharmacy electronically.  

## 2017-02-03 ENCOUNTER — Other Ambulatory Visit: Payer: Self-pay | Admitting: *Deleted

## 2017-02-03 DIAGNOSIS — I6523 Occlusion and stenosis of bilateral carotid arteries: Secondary | ICD-10-CM

## 2017-02-27 ENCOUNTER — Encounter: Payer: Self-pay | Admitting: Cardiology

## 2017-02-27 ENCOUNTER — Ambulatory Visit: Payer: BLUE CROSS/BLUE SHIELD | Admitting: Cardiology

## 2017-02-27 ENCOUNTER — Encounter (INDEPENDENT_AMBULATORY_CARE_PROVIDER_SITE_OTHER): Payer: Self-pay

## 2017-02-27 VITALS — BP 130/72 | HR 80 | Ht 68.0 in | Wt 169.0 lb

## 2017-02-27 DIAGNOSIS — I10 Essential (primary) hypertension: Secondary | ICD-10-CM | POA: Diagnosis not present

## 2017-02-27 DIAGNOSIS — R0989 Other specified symptoms and signs involving the circulatory and respiratory systems: Secondary | ICD-10-CM | POA: Diagnosis not present

## 2017-02-27 DIAGNOSIS — Z9889 Other specified postprocedural states: Secondary | ICD-10-CM | POA: Diagnosis not present

## 2017-02-27 DIAGNOSIS — Z9861 Coronary angioplasty status: Secondary | ICD-10-CM | POA: Diagnosis not present

## 2017-02-27 DIAGNOSIS — I214 Non-ST elevation (NSTEMI) myocardial infarction: Secondary | ICD-10-CM | POA: Diagnosis not present

## 2017-02-27 DIAGNOSIS — E785 Hyperlipidemia, unspecified: Secondary | ICD-10-CM

## 2017-02-27 DIAGNOSIS — F17200 Nicotine dependence, unspecified, uncomplicated: Secondary | ICD-10-CM

## 2017-02-27 DIAGNOSIS — E663 Overweight: Secondary | ICD-10-CM

## 2017-02-27 DIAGNOSIS — I2510 Atherosclerotic heart disease of native coronary artery without angina pectoris: Secondary | ICD-10-CM

## 2017-02-27 DIAGNOSIS — I255 Ischemic cardiomyopathy: Secondary | ICD-10-CM

## 2017-02-27 MED ORDER — ROSUVASTATIN CALCIUM 40 MG PO TABS: 40.0000 mg | ORAL_TABLET | Freq: Every day | ORAL | 3 refills | Status: DC
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
## 2017-02-27 NOTE — Assessment & Plan Note (Signed)
Well-controlled on ACE inhibitor and beta-blocker.

## 2017-02-27 NOTE — Assessment & Plan Note (Addendum)
Bruit is present, but not significant.  We can hold off for another year to reevaluate based on moderate findings on last carotid Dopplers. He is hoping to avoid studies and therefore we will not recheck annually as recommended.

## 2017-02-27 NOTE — Assessment & Plan Note (Signed)
He still only has class I heart failure symptoms despite having reduced EF.  He has not never had any PND orthopnea or edema since we did his last PCI. He is on a stable dose of lisinopril and carvedilol.  Mostly because of cost issues we have not been too aggressive with medications especially since he is feeling well.  He again is not interested in [place] because the cost issue. He is euvolemic without any diuretic.

## 2017-02-27 NOTE — Patient Instructions (Signed)
Medication instruction -- increase  crestor ( rosuvastatin ) 40 mg one tablet daily   Please let office know when you get your cholesterol level done at primary.     Your physician wants you to follow-up in 12 months with Dr Ellyn Hack.You will receive a reminder letter in the mail two months in advance. If you don't receive a letter, please call our office to schedule the follow-up appointment.

## 2017-02-27 NOTE — Assessment & Plan Note (Signed)
His initial presenting symptom back in 2011 was with class IV heart failure as opposed to significant angina symptoms.  It was mostly just severe heart failure symptoms and is a large infarct at that time.  Large apical defect on Myoview.  Subsequently underwent multivessel CABG and then PCI of the LAD in the No further angina symptoms.  Despite reduced EF doing well without any heart failure.

## 2017-02-27 NOTE — Assessment & Plan Note (Addendum)
Minimal murmur heard.  Based on his request to minimize studies, I think we can hold off on rechecking an echocardiogram for a few more years unless the murmur worsens.

## 2017-02-27 NOTE — Assessment & Plan Note (Signed)
He is now pretty much no longer smoking cigarettes and is using the e-cigarettes.  He has even cut down how much of that he uses.

## 2017-02-27 NOTE — Assessment & Plan Note (Signed)
".  Now is BMI is pretty much in the normal range.  Doing a great job with exercise, but needs to get back on track with his diet.  He needs to quit his "cheating.

## 2017-02-27 NOTE — Assessment & Plan Note (Signed)
Following his CABG he still had reduced EF and we did a long stent into the diagonal branch in the proximal LAD.  Since that he did have improvement in EF as well as symptoms. No anginal symptoms.  He remains on Plavix and aspirin without any significant bruising.  He is on a statin as well as beta-blocker and ACE inhibitor on stable doses.  He would prefer to avoid unnecessary testing until he knows he is stable healthcare coverage.  He would be due for stress test, but with him being so active I think we can hold off on doing any follow-up evaluations until he becomes less active.

## 2017-02-27 NOTE — Assessment & Plan Note (Signed)
Unfortunately, he was not as well controlled on simvastatin, he was switched over to rosuvastatin and is now on 20mg  rosuvastatin.  He is due for follow-up labs soon, but the most recent labs in June were actually worse than before. Plan will be to increase Crestor to 40 mg daily and reassess accordingly. Low threshold to consider at least referral to discuss PCSK9 inhibitor.

## 2017-03-11 NOTE — Addendum Note (Signed)
Addended by: Zebedee Iba on: 03/11/2017 02:04 PM   Modules accepted: Orders

## 2017-06-06 ENCOUNTER — Other Ambulatory Visit: Payer: Self-pay | Admitting: Cardiology

## 2017-06-06 NOTE — Telephone Encounter (Signed)
REFILL 

## 2018-01-20 ENCOUNTER — Inpatient Hospital Stay (HOSPITAL_COMMUNITY): Admission: RE | Admit: 2018-01-20 | Payer: BLUE CROSS/BLUE SHIELD | Source: Ambulatory Visit

## 2018-03-11 ENCOUNTER — Encounter: Payer: Self-pay | Admitting: Cardiology

## 2018-03-11 ENCOUNTER — Ambulatory Visit: Payer: BLUE CROSS/BLUE SHIELD | Admitting: Cardiology

## 2018-03-11 VITALS — BP 128/70 | HR 80 | Ht 68.0 in | Wt 172.0 lb

## 2018-03-11 DIAGNOSIS — I2510 Atherosclerotic heart disease of native coronary artery without angina pectoris: Secondary | ICD-10-CM

## 2018-03-11 DIAGNOSIS — Z9861 Coronary angioplasty status: Secondary | ICD-10-CM

## 2018-03-11 DIAGNOSIS — E1169 Type 2 diabetes mellitus with other specified complication: Secondary | ICD-10-CM | POA: Diagnosis not present

## 2018-03-11 DIAGNOSIS — E785 Hyperlipidemia, unspecified: Secondary | ICD-10-CM

## 2018-03-11 DIAGNOSIS — I10 Essential (primary) hypertension: Secondary | ICD-10-CM | POA: Diagnosis not present

## 2018-03-11 DIAGNOSIS — F17200 Nicotine dependence, unspecified, uncomplicated: Secondary | ICD-10-CM

## 2018-03-11 DIAGNOSIS — I255 Ischemic cardiomyopathy: Secondary | ICD-10-CM | POA: Diagnosis not present

## 2018-03-11 DIAGNOSIS — Z9889 Other specified postprocedural states: Secondary | ICD-10-CM

## 2018-03-11 DIAGNOSIS — R0989 Other specified symptoms and signs involving the circulatory and respiratory systems: Secondary | ICD-10-CM

## 2018-03-11 DIAGNOSIS — Z951 Presence of aortocoronary bypass graft: Secondary | ICD-10-CM

## 2018-03-11 MED ORDER — VALSARTAN 80 MG PO TABS: 80.0000 mg | ORAL_TABLET | Freq: Every day | ORAL | 3 refills | Status: DC

## 2018-03-11 NOTE — Progress Notes (Signed)
PCP: Jamesetta Orleans, PA-C -- [facility] in [place].   Clinic Note: Chief Complaint  Patient presents with  . Follow-up    Annual  . Coronary Artery Disease    No angina  . Cardiomyopathy    Ischemic; no CHF    HPI: Eric Bradshaw is a 62 y.o. 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 Moderately dilated LV with distal septal-apical, mid and basal inferior akinesis; status post mitral valve ring. Significant diastolic gradient with mild MR. Moderate LA dilation  Has done well with no CHF or angina Sx   Eric Bradshaw was last seen in November 2018.  He was doing well at that time without any major complaints.  Recent Hospitalizations: None  Studies Personally Reviewed - (if available, images/films reviewed: From Epic Chart or Care Everywhere)  Carotid Doppler 02/2016: 40-59% B ICA. Patent Vertebral & Rogers Arteries.  Interval History: Eric Bradshaw returns today overall doing very well.  He has some complaints of his diabetes medications.  Apparently he was given a try Jardiance but that cost too much.  He was also started on Januvia but that seemed to be more expensive than he could afford. He says from a cardiac standpoint he is doing fine he is still does what he was able to do 15-20 years ago now.  He has 2 new jobs that do not have him as active as he had been, but he is  still always on the go.  He denies any chest tightness pressure with rest or exertion.  No PND, orthopnea or edema.  No rapid heartbeats palpitations. He still does his electronic cigarette and Nicorette lozenges, been pretty good with smoking cessation.  Maybe has a drag on a cigarette once a month.  He denies any claudication.  But he does notice he has a nagging dry cough that is been going on for a while.  He tells me that he drinks maybe 1 or 2 shots of whiskey in the evening and then chases it with a beer or 2 every almost every night.  He acknowledges that this is probably not good for him, but it is more for stress release.  Doing better with his diet.  Cheating less.  Additional cardiac review of systems: Lightheadedness - no, dizziness - no, syncope/near-syncope - no; TIA/amaurosis fugax - no Melena - no, hematochezia no; hematuria - no; nosebleeds - no; claudication - no   ROS: A comprehensive was performed. Review of Systems  Constitutional: Positive for weight loss (Intentional).  HENT: Negative for congestion, hearing loss and nosebleeds.   Respiratory: Positive for cough (Dry hacking cough). Negative for shortness of breath and wheezing.   Cardiovascular: Negative for leg swelling.  Gastrointestinal: Negative for abdominal pain, constipation and diarrhea.  Genitourinary: Negative for dysuria.  Musculoskeletal: Positive for back pain. Negative  for myalgias.  Neurological: Negative for dizziness and headaches.  Psychiatric/Behavioral: Negative.   All other systems reviewed and are negative.  I have reviewed and (if needed) personally updated the patient's problem list, medications, allergies, past medical and surgical history, social and family history.   Past Medical History:  Diagnosis Date  . CAD S/P percutaneous coronary angioplasty 03/2010 -- 3-07/2011   Proximal LAD ~80% (into D2) & mid - subtotal occlusion , RCA occlusion, LCx occlusion -- s/p CABG x 3  (LIMA-LAD,  SVG-OM, SVG-RPDA); Cath 06/2011, patent grafts with severe proximal  LAD - D1 lesions -- 07/2011: PCI  proxLAD-into D2 with Promus Premier DES 2.5 mm x 38 mm (distal in D2 2.5 mm, @ bifurcation - 2.75 m, in prox LAD ~3.0 mm  . Chronic combined systolic and diastolic HF (heart failure), NYHA class 2 ([place]) 03/2010; 08/2103   a) EF ~30-30%; Exertional Dyspnea; No exaccerbations.;; b)EF 35-40%, Mod LA dilation  . COPD (chronic obstructive pulmonary disease) ([place])   . DM (diabetes mellitus) type II controlled peripheral vascular disorder 2013   & CAD  . HTN, goal below 130/80 03/2010  . Hyperlipidemia LDL goal < 70   . Ischemic cardiomyopathy 03/2010; May 2015   a) Echo - EF 30-40%; mild/mod anterior wall hypokinesis; doppler flow suggestive of impaired LV relaxation; LA moderately dilated; annuloplasty ring noted in mitral position; mild/mod mitral regurgitation; RVsystolic pressure elevated at 30-6mmHg;;; b) Echo May 2015: EF 35-40%  . NSTEMI (non-ST elevated myocardial infarction) ([place]) 25/8527   Complicated by Severe Acute Systolic CHF -- Class IV CHF,  . S/P CABG x 3 03/2010   LIMA-mLAD, SVG-OM, SVG-rPDA  . S/P MVR (mitral valve repair) 03/2010   @ time of CABG for ischemic MR - 26 mm Edwards physio-2 annuloplasty ring  . Tobacco dependence    Initially quit - restarted ~mid 2012; now essentially quit.    Past Surgical History:  Procedure Laterality Date  . CORONARY ARTERY BYPASS GRAFT  03/2010   LIMA-LAD, SVG-OM, SVG-RPDA  . Left Arm Surgery - NOS    . LEFT HEART CATHETERIZATION WITH CORONARY/GRAFT ANGIOGRAM  07/03/2011   Procedure: LEFT HEART CATHETERIZATION WITH Beatrix Fetters;  Surgeon: Leonie Man, MD;  Location: [facility] CATH LAB;  Service: Cardiovascular;Patent LIMA-LAD, SVG-OM, SVG-RPDA; (native RCA & OM prox 100%, prox LAD into major D2 ~80% long lesion, midLAD 100%  . MITRAL VALVULOPLASTY  03/2010   conjuntive with CABG for Severe MR; 26 mm Edwards physio-2 annuloplasty  ring  . NM MYOVIEW LTD  04/24/2011   R/L MV - EF 37%; mild perfusion defect due to attenuation w/mild to mod superimposed ischemia seen in apex, apical lateral and distal to mid ateroseptal region; LV systolic fcn moderately reduced; hypotensive BP response to stress w/o chest pain; high risk scan  . PERCUTANEOUS CORONARY STENT INTERVENTION (PCI-S) N/A 08/02/2011   Procedure: PERCUTANEOUS CORONARY STENT INTERVENTION (PCI-S);  Surgeon: Leonie Man, MD;  Location: [facility] CATH LAB;  Service: Cardiovascular;  PCI proximal LAD-D2: Promus Premier DES 2.5 x 38 (tapered post-dilation from 3.0 mm in proximal LAD, 2.75 mm at bifurcation and 2.5 mm in D2)  . TONSILLECTOMY     Childhood  . TRANSTHORACIC ECHOCARDIOGRAM  03/23/2010    Pre-CABG/MVR: EF 25-30%; global HK; Mod-Severe MR  . TRANSTHORACIC ECHOCARDIOGRAM  09/2011; 08/2013   a) EF up to 30-40% (post PCI); mild-mod Ant wall HK; Gd 1 DD; MV Annuloplasty ring in place, mild-mod MR; mild PHTN (30-40 mmHg);; b)  EF 35-40%  with moderately reduced function. Moderately dilated LV with distal septal-apical, mid and basal inferior akinesis; status post mitral valve ring. Significant diastolic gradient with mild MR. Moderate LA dilation.      Current Meds  Medication Sig  . aspirin EC 81 MG tablet Take 81 mg by mouth daily.  . B Complex-C (SUPER B COMPLEX PO) Take 1 tablet by mouth daily.  . Calcium Carbonate-Vit D-Min (CALCIUM 1200 PO) Take 1,200 mg by mouth daily.  . carvedilol (COREG) 12.5 MG tablet TAKE 1/2 TABLET BY MOUTH IN THE MORNING AND 1 TABLET AT NIGHT  . cholecalciferol (VITAMIN D) 1000 UNITS tablet Take 5,000 Units by mouth daily.  . clopidogrel (PLAVIX) 75 MG tablet TAKE 1 TABLET (75 MG TOTAL) BY MOUTH DAILY.  Marland Kitchen glimepiride (AMARYL) 2 MG tablet Take 1 tablet (2 mg total) by mouth daily with breakfast.  . magnesium oxide (MAG-OX) 400 MG tablet Take 400 mg by mouth daily.  . Multiple Vitamins-Minerals (MULTIVITAMINS THER. W/MINERALS) TABS Take 1  tablet by mouth daily.  . Omega-3 Fatty Acids (FISH OIL) 1200 MG CAPS Take 2,400 mg by mouth daily.  . vitamin C (ASCORBIC ACID) 500 MG tablet Take 1,500 mg by mouth daily.  . vitamin E (VITAMIN E) 1000 UNIT capsule Take 1,000 Units by mouth daily.  . [DISCONTINUED] lisinopril (PRINIVIL,ZESTRIL) 10 MG tablet TAKE 1 TABLET BY MOUTH DAILY    Allergies  Allergen Reactions  . Morphine And Related Other (See Comments)    HEADACHE   Social History   Tobacco Use  . Smoking status: Former Smoker    Packs/day: 0.00    Years: 50.00    Pack years: 0.00    Types: Cigarettes  . Smokeless tobacco: Former Systems developer    Types: Chew  . Tobacco comment: Quit in 2011 but restarted mid 2012,uses E-cigarettes; Not actually smoking, but does have 2nd hand exposure.  Substance Use Topics  . Alcohol use: Yes    Alcohol/week: 3.0 standard drinks    Types: 3 Standard drinks or equivalent per week    Comment: 3-6 Beers per week  . Drug use: No   Social History   Social History Narrative   He currently lives with girlfriend. He has 2 children from his ex-wife.   Still smokes less than a quarter pack a day. He goes back and forth between having quit in smoking again.   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 He is hoping that he can increase his hours at least one of those jobs and up to to get him back on an insurance plan.   Family History  family history includes Cancer in his maternal grandmother; Diabetes in his paternal grandfather and paternal grandmother; Heart failure (age of onset: 36) in his father; Kidney failure in his brother; Lung cancer in his mother; Stroke in his maternal grandfather and paternal grandfather.  Wt Readings from Last 3 Encounters:  03/11/18 172 lb (78 kg)  02/27/17 169 lb (76.7 kg)  02/07/16 169 lb 3.2 oz (76.7 kg)    PHYSICAL EXAM BP 128/70   Pulse 80   Ht 5\' 8"  (1.727 m)   Wt 172 lb (78 kg)   BMI  26.15 kg/m  Physical Exam  Constitutional: He is oriented to person, place, and time. He appears well-developed and well-nourished. No distress.  Well-groomed.  Healthy-appearing  HENT:  Head: Normocephalic and atraumatic.  Eyes: EOM are normal.  Neck:  Normal range of motion. Neck supple. No hepatojugular reflux and no JVD present. Carotid bruit is not present.  Cardiovascular: Normal rate, regular rhythm and intact distal pulses.  No extrasystoles are present. PMI is not displaced. Exam reveals no gallop, no distant heart sounds and no friction rub.  Murmur heard.  Blowing holosystolic murmur is present with a grade of 1/6 at the apex. Pulmonary/Chest: Effort normal and breath sounds normal. No respiratory distress. He has no wheezes. He has no rales.  Abdominal: Soft. Bowel sounds are normal. He exhibits no distension. There is no tenderness.  Musculoskeletal: Normal range of motion.  Neurological: He is alert and oriented to person, place, and time.  Skin: Skin is warm and dry. No rash noted. No erythema.  Psychiatric: He has a normal mood and affect. His behavior is normal. Judgment and thought content normal.  Normal lackadaisical self  Nursing note and vitals reviewed.    Adult ECG Report  Rate: 80;  Rhythm: normal sinus rhythm and LVH with repolarization changes.  Cannot exclude septal MI, age undetermined.  Also lateral and inferior MI, age undetermined.;   Narrative Interpretation: Stable EKG   Other studies Reviewed: Additional studies/ records that were reviewed today include:  Recent Labs: 02/26/2018 -scanned in chart  Na+ 132, K+ 4.4, Cl- 102, HCO3- 25 , BUN 23, Cr 0.76, Glu 309, Ca2+ 8.7; AST 14, ALT 21, AlkP 97  CBC: W 10.4, H/H 14.4/41.9, Plt 181; A1c 9.0  TC 132, TG 229, HDL 31, LDL 55   ASSESSMENT / PLAN: Problem List Items Addressed This Visit    CAD - s/p CABG x 17 Mar 2010. Abnormal Myoview-LAD & D1 disease 06/2011 Rx'd with Promus DES - Primary (Chronic)     CABG for then extensive LAD-diagonal PCI for nongrafted diagonal branch.  Had some improvement as of EF but not back to normal.  He is on aspirin plus Plavix.  Also on statin beta-blocker and ACE inhibitor.  Recommendation: DC aspirin  Okay to hold Plavix for procedures (5-7 days)      Relevant Medications   valsartan (DIOVAN) 80 MG tablet   Other Relevant Orders   EKG 12-Lead   Essential hypertension (Chronic)    Well-controlled on current meds.   With dry cough, converting from ACE inhibitor to ARB.      Relevant Medications   valsartan (DIOVAN) 80 MG tablet   Hyperlipidemia associated with type 2 diabetes mellitus (HCC) (Chronic)    Well-controlled LDL, however HDL is little low and triglycerides are bit high.  Triglycerides probably more related to diabetes.  We talked about dietary modification.  Hopefully when his blood sugars get better controlled this will improve.   He had issues with affording Januvia --would probably not be interested in trying Jardiance which has proven cardiovascular benefit.      Relevant Medications   valsartan (DIOVAN) 80 MG tablet   Ischemic Cardiomyopathy - EF ~30-40%; Class II CHF (Chronic)    At the most class I symptoms.  Very active without any major issues. He is on carvedilol and lisinopril.  But with his cough and switch him to Diovan.  Otherwise euvolemic with no requirement for diuretic.   Again with his discussions about the cost of medications, he would not be interested in [place]. Also not interested in ICD      Relevant Medications   valsartan (DIOVAN) 80 MG tablet   Other Relevant Orders   EKG 12-Lead   Right carotid bruit  Moderate carotid disease.  Will hold off on rechecking Dopplers until 2021      S/P CABG x 3 (Chronic)    He asked to avoid doing any studies until he gets on the Medicare.  This would probably be next year.  We can then consider doing a screening stress test.  Right now still having trouble having  stable insurance.      S/P MVR (mitral valve repair) (Chronic)    Minimal murmur heard.  Would only recheck echo if murmur worsens.      Tobacco dependence (Chronic)    Still using e-cigarette and Nicorette lozenges.  But really not smoking more than half a cigarette a month         Current medicines are reviewed at length with the patient today. (+/- concerns) none The following changes have been made:Increase Crestor to 40 mg  Patient Instructions  Medication Instructions:  STOP LISINOPRIL   START VALSARTAN 80 MG ONE TABLET DAILY  If you need a refill on your cardiac medications before your next appointment, please call your pharmacy.   Lab work: NOT NEEDED If you have labs (blood work) drawn today and your tests are completely normal, you will receive your results only by: Marland Kitchen MyChart Message (if you have MyChart) OR . A paper copy in the mail If you have any lab test that is abnormal or we need to change your treatment, we will call you to review the results.  Testing/Procedures: NOT NEEDED  Follow-Up: At [facility], you and your health needs are our priority.  As part of our continuing mission to provide you with exceptional heart care, we have created designated Provider Care Teams.  These Care Teams include your primary Cardiologist (physician) and Advanced Practice Providers (APPs -  Physician Assistants and Nurse Practitioners) who all work together to provide you with the care you need, when you need it. You will need a follow up appointment in 12 months.  Please call our office 2 months in advance to schedule this appointment.  You may see Glenetta Hew, MD or one of the following Advanced Practice Providers on your designated Care Team:   Rosaria Ferries, PA-C . Jory Sims, DNP, ANP  Any Other Special Instructions Will Be Listed Below (If Applicable).     Studies Ordered:   Orders Placed This Encounter  Procedures  . EKG 12-Lead      Glenetta Hew, M.D., M.S. Interventional Cardiologist   Pager # [phone] Phone # [phone] [address] [place], [place] [address]

## 2018-03-11 NOTE — Patient Instructions (Signed)
Medication Instructions:  STOP LISINOPRIL   START VALSARTAN 80 MG ONE TABLET DAILY  If you need a refill on your cardiac medications before your next appointment, please call your pharmacy.   Lab work: NOT NEEDED If you have labs (blood work) drawn today and your tests are completely normal, you will receive your results only by: Marland Kitchen MyChart Message (if you have MyChart) OR . A paper copy in the mail If you have any lab test that is abnormal or we need to change your treatment, we will call you to review the results.  Testing/Procedures: NOT NEEDED  Follow-Up: At [facility], you and your health needs are our priority.  As part of our continuing mission to provide you with exceptional heart care, we have created designated Provider Care Teams.  These Care Teams include your primary Cardiologist (physician) and Advanced Practice Providers (APPs -  Physician Assistants and Nurse Practitioners) who all work together to provide you with the care you need, when you need it. You will need a follow up appointment in 12 months.  Please call our office 2 months in advance to schedule this appointment.  You may see Glenetta Hew, MD or one of the following Advanced Practice Providers on your designated Care Team:   Rosaria Ferries, PA-C . Jory Sims, DNP, ANP  Any Other Special Instructions Will Be Listed Below (If Applicable).

## 2018-03-13 ENCOUNTER — Encounter: Payer: Self-pay | Admitting: Cardiology

## 2018-03-13 NOTE — Assessment & Plan Note (Signed)
Well-controlled on current meds.   With dry cough, converting from ACE inhibitor to ARB.

## 2018-03-13 NOTE — Assessment & Plan Note (Signed)
Well-controlled LDL, however HDL is little low and triglycerides are bit high.  Triglycerides probably more related to diabetes.  We talked about dietary modification.  Hopefully when his blood sugars get better controlled this will improve.   He had issues with affording Januvia --would probably not be interested in trying Jardiance which has proven cardiovascular benefit.

## 2018-03-13 NOTE — Assessment & Plan Note (Signed)
Moderate carotid disease.  Will hold off on rechecking Dopplers until 2021

## 2018-03-13 NOTE — Assessment & Plan Note (Signed)
He asked to avoid doing any studies until he gets on the Medicare.  This would probably be next year.  We can then consider doing a screening stress test.  Right now still having trouble having stable insurance.

## 2018-03-13 NOTE — Assessment & Plan Note (Signed)
Minimal murmur heard.  Would only recheck echo if murmur worsens.

## 2018-03-13 NOTE — Assessment & Plan Note (Signed)
At the most class I symptoms.  Very active without any major issues. He is on carvedilol and lisinopril.  But with his cough and switch him to Diovan.  Otherwise euvolemic with no requirement for diuretic.   Again with his discussions about the cost of medications, he would not be interested in [place]. Also not interested in ICD

## 2018-03-13 NOTE — Assessment & Plan Note (Signed)
CABG for then extensive LAD-diagonal PCI for nongrafted diagonal branch.  Had some improvement as of EF but not back to normal.  He is on aspirin plus Plavix.  Also on statin beta-blocker and ACE inhibitor.  Recommendation: DC aspirin  Okay to hold Plavix for procedures (5-7 days)

## 2018-03-13 NOTE — Assessment & Plan Note (Addendum)
Still using e-cigarette and Nicorette lozenges.  But really not smoking more than half a cigarette a month

## 2019-03-18 ENCOUNTER — Other Ambulatory Visit: Payer: Self-pay

## 2019-03-18 ENCOUNTER — Ambulatory Visit (INDEPENDENT_AMBULATORY_CARE_PROVIDER_SITE_OTHER): Payer: Medicare Other | Admitting: Cardiology

## 2019-03-18 ENCOUNTER — Encounter: Payer: Self-pay | Admitting: Cardiology

## 2019-03-18 VITALS — BP 139/86 | HR 90 | Temp 97.5°F | Ht 68.0 in | Wt 170.4 lb

## 2019-03-18 DIAGNOSIS — E1169 Type 2 diabetes mellitus with other specified complication: Secondary | ICD-10-CM | POA: Diagnosis not present

## 2019-03-18 DIAGNOSIS — I2510 Atherosclerotic heart disease of native coronary artery without angina pectoris: Secondary | ICD-10-CM

## 2019-03-18 DIAGNOSIS — E119 Type 2 diabetes mellitus without complications: Secondary | ICD-10-CM

## 2019-03-18 DIAGNOSIS — Z9889 Other specified postprocedural states: Secondary | ICD-10-CM

## 2019-03-18 DIAGNOSIS — I255 Ischemic cardiomyopathy: Secondary | ICD-10-CM | POA: Diagnosis not present

## 2019-03-18 DIAGNOSIS — Z9861 Coronary angioplasty status: Secondary | ICD-10-CM

## 2019-03-18 DIAGNOSIS — I214 Non-ST elevation (NSTEMI) myocardial infarction: Secondary | ICD-10-CM | POA: Diagnosis not present

## 2019-03-18 DIAGNOSIS — Z955 Presence of coronary angioplasty implant and graft: Secondary | ICD-10-CM | POA: Insufficient documentation

## 2019-03-18 DIAGNOSIS — E785 Hyperlipidemia, unspecified: Secondary | ICD-10-CM

## 2019-03-18 DIAGNOSIS — I10 Essential (primary) hypertension: Secondary | ICD-10-CM | POA: Diagnosis not present

## 2019-03-18 DIAGNOSIS — Z951 Presence of aortocoronary bypass graft: Secondary | ICD-10-CM

## 2019-03-18 DIAGNOSIS — F17200 Nicotine dependence, unspecified, uncomplicated: Secondary | ICD-10-CM

## 2019-03-18 MED ORDER — ROSUVASTATIN CALCIUM 40 MG PO TABS: 40.0000 mg | ORAL_TABLET | Freq: Every day | ORAL | 3 refills | Status: AC

## 2019-03-18 MED ORDER — CARVEDILOL 12.5 MG PO TABS: ORAL_TABLET | ORAL | 3 refills | Status: DC

## 2019-03-18 MED ORDER — CLOPIDOGREL BISULFATE 75 MG PO TABS: 75.0000 mg | ORAL_TABLET | Freq: Every day | ORAL | 3 refills | Status: DC

## 2019-03-18 MED ORDER — VALSARTAN 80 MG PO TABS: 80.0000 mg | ORAL_TABLET | Freq: Every day | ORAL | 3 refills | Status: DC

## 2019-03-18 NOTE — Progress Notes (Signed)
Primary Care Provider: Drosinis, Pamalee Leyden, PA-C Cardiologist: Glenetta Hew, MD Electrophysiologist:   Clinic Note: Chief Complaint  Patient presents with   Follow-up    Annual; has been off most with medications for roughly 8 months out of the past year because of financial issues.   Coronary Artery Disease    No angina or heart failure   Cardiomyopathy    Ischemic, no CHF symptoms    HPI:    Eric Bradshaw is a 63 y.o. male with a cardiac history noted below who presents today for annual follow-up.  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 Moderately dilated LV with distal septal-apical, mid and basal inferior akinesis; status post mitral valve ring. Significant diastolic gradient with mild MR. Moderate LA dilation   Has done well with no CHF or angina Sx  DOCTOR COBAS was last seen in November 2019.  Was doing well at that time.  Only a complaints of diabetes medicine.  Jardiance caused too much. -->  No cardiac complaints.  Able to do what he was able to do 15-20.  Working 2 jobs still.  Noted a nagging dry cough. He drinks maybe 1 or 2 shots of whiskey in the evening and then chases it with a beer or 2 every almost every night.  He acknowledges that this is probably not good for him, but it is more for stress release. Doing better with his diet.  Cheating less.  Recent Hospitalizations: None   Reviewed  CV studies:    The following studies were reviewed today: (if available,  images/films reviewed: From Epic Chart or Care Everywhere)  None:    Interval History:   Eric Bradshaw returns here today as usual feeling fine with no major issues.  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  He also is plenty busy at home doing yard work, raking leaves and also effect of activities.  Other than some allergies that he has been having some coughing and wheezing, no major issues.  He is actually cutting down his e-cigarettes. He admits to a pretty poor diet, tends to eat fast foods and not being smart.  He also indicates that for about the last 8 months he been off and on his medications and has not been taking his ARB for a while.  He is otherwise pretty asymptomatic from cardiac standpoint.  CV Review of Symptoms (Summary)no chest pain or dyspnea on exertion positive for - Intermittently being nonadherent to taking medications negative for - edema, irregular heartbeat, orthopnea, palpitations, paroxysmal nocturnal dyspnea, rapid heart rate or shortness of breath, syncope/near syncope TIA/amaurosis fugax.  The patient does not have symptoms concerning for COVID-19 infection (fever, chills, cough, or new shortness of breath).  The patient is practicing social distancing. ++ Masking.  HE DOES GO Groceries/shopping. VERY CAREFUL @ Work   REVIEWED OF SYSTEMS   A comprehensive ROS was performed. Review of Systems  Constitutional: Negative for malaise/fatigue (He is tired today, because he stayed up late) and weight loss.  HENT: Negative for congestion and nosebleeds.   Respiratory: Positive for cough (Most mornings). Negative for shortness of breath and wheezing (When he has wheezing is because of allergies.).   Cardiovascular: Negative for claudication.  Gastrointestinal: Negative for blood in stool and melena.  Genitourinary: Negative for hematuria.  Musculoskeletal: Negative for falls and joint pain.  Neurological:  Negative for dizziness and headaches.  Endo/Heme/Allergies: Positive for environmental allergies. Bruises/bleeds easily.  Psychiatric/Behavioral: Negative for depression and memory loss. The patient is not nervous/anxious and does not have insomnia.   All other systems reviewed and are negative.  I have reviewed and (if needed) personally updated the patient's problem list, medications, allergies, past medical and surgical history, social and family history.   PAST MEDICAL HISTORY   Past Medical History:  Diagnosis Date   CAD S/P percutaneous coronary angioplasty 03/2010 -- 3-07/2011   Proximal LAD ~80% (into D2) & mid - subtotal occlusion , RCA occlusion, LCx occlusion -- s/p CABG x 3  (LIMA-LAD, SVG-OM, SVG-RPDA); Cath 06/2011, patent grafts with severe proximal  LAD - D1 lesions -- 07/2011: PCI  proxLAD-into D2 with Promus Premier DES 2.5 mm x 38 mm (distal in D2 2.5 mm, @ bifurcation - 2.75 m, in prox LAD ~3.0 mm   Chronic combined systolic and diastolic HF (heart failure), NYHA class 2 ([place]) 03/2010; 08/2103   a) EF ~30-30%; Exertional Dyspnea; No exaccerbations.;; b)EF 35-40%, Mod LA dilation   COPD (chronic obstructive pulmonary disease) (HCC)    DM (diabetes mellitus) type II controlled peripheral vascular disorder 2013   & CAD   HTN, goal below 130/80 03/2010   Hyperlipidemia LDL goal < 70    Ischemic cardiomyopathy 03/2010; May 2015   a) Echo - EF 30-40%; mild/mod anterior wall hypokinesis; doppler flow suggestive of impaired LV relaxation; LA moderately dilated; annuloplasty ring noted in mitral position; mild/mod mitral regurgitation; RVsystolic pressure elevated at 30-57mmHg;;; b) Echo May 2015: EF 35-40%   NSTEMI (non-ST elevated myocardial infarction) ([place]) [id number]   Complicated by Severe Acute Systolic CHF -- Class IV CHF,   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    PAST SURGICAL HISTORY   Past Surgical History:  Procedure Laterality Date   CORONARY ARTERY BYPASS GRAFT  03/2010   LIMA-LAD, SVG-OM, SVG-RPDA   Left Arm Surgery - NOS     LEFT HEART CATHETERIZATION WITH CORONARY/GRAFT ANGIOGRAM  07/03/2011   Procedure: LEFT HEART CATHETERIZATION WITH Beatrix Fetters;  Surgeon: Leonie Man, MD;  Location: [facility] CATH LAB;  Service: Cardiovascular;Patent LIMA-LAD, SVG-OM, SVG-RPDA; (native RCA & OM prox 100%, prox LAD into major D2 ~80% long lesion, midLAD 100%   MITRAL VALVULOPLASTY  03/2010   conjuntive with CABG for Severe MR; 26 mm Edwards physio-2 annuloplasty ring   NM MYOVIEW LTD  04/24/2011   R/L MV - EF 37%; mild perfusion defect due to attenuation w/mild to mod superimposed ischemia seen in apex, apical lateral and distal to mid ateroseptal region; LV systolic fcn moderately reduced; hypotensive BP response to stress w/o chest pain; high risk scan   PERCUTANEOUS CORONARY STENT INTERVENTION (PCI-S) N/A 08/02/2011   Procedure: PERCUTANEOUS CORONARY STENT  INTERVENTION (PCI-S);  Surgeon: Leonie Man, MD;  Location: [facility] C[facility] LAB;  Service: Cardiovascular;  PCI proximal LAD-D2: Promus Premier DES 2.5 x 38 (tapered post-dilation from 3.0 mm in proximal LAD, 2.75 mm at bifurcation and 2.5 mm in D2)   TONSILLECTOMY     Childhood   TRANSTHORACIC ECHOCARDIOGRAM  03/23/2010    Pre-CABG/MVR: EF 25-30%; global HK; Mod-Severe MR   TRANSTHORACIC ECHOCARDIOGRAM  09/2011; 08/2013   a) EF up to 30-40% (post PCI); mild-mod Ant wall HK; Gd 1 DD; MV Annuloplasty ring in place, mild-mod MR; mild PHTN (30-40 mmHg);; b)  EF 35-40% with moderately reduced function. Moderately dilated LV with distal septal-apical, mid and basal inferior akinesis; status post mitral valve ring. Significant diastolic gradient with mild MR. Moderate LA dilation.       MEDICATIONS/ALLERGIES   Current Meds  Medication Sig   aspirin EC 81 MG tablet Take 81 mg by mouth daily.   B Complex-C (SUPER B COMPLEX PO) Take 1 tablet by mouth daily.   Calcium Carbonate-Vit D-Min (CALCIUM 1200 PO) Take 1,200 mg by mouth daily.   carvedilol (COREG) 12.5 MG tablet TAKE 1/2 TABLET BY MOUTH IN THE MORNING AND 1 TABLET AT NIGHT   cholecalciferol (VITAMIN D) 1000 UNITS tablet Take 5,000 Units by mouth daily.   clopidogrel (PLAVIX) 75 MG tablet Take 1 tablet (75 mg total) by mouth daily.   glimepiride (AMARYL) 2 MG tablet Take 1 tablet (2 mg total) by mouth daily with breakfast.   magnesium oxide (MAG-OX) 400 MG tablet Take 400 mg by mouth daily.   Multiple Vitamins-Minerals (MULTIVITAMINS THER. W/MINERALS) TABS Take 1 tablet by mouth daily.   Omega-3 Fatty Acids (FISH OIL) 1200 MG CAPS Take 2,400 mg by mouth daily.   sitaGLIPtin (JANUVIA) 50 MG tablet Take 50 mg by mouth daily.   valsartan (DIOVAN) 80 MG tablet Take 1 tablet (80 mg total) by mouth daily.   vitamin C (ASCORBIC ACID) 500 MG tablet Take 1,500 mg by mouth daily.   vitamin E (VITAMIN E) 1000 UNIT capsule Take 1,000 Units by mouth daily.   Zinc Sulfate (ZINC 15 PO) Take 15 mg by mouth daily.   [DISCONTINUED] carvedilol (COREG) 12.5 MG tablet TAKE 1/2 TABLET BY MOUTH IN THE MORNING AND 1 TABLET AT NIGHT   [DISCONTINUED] Cholecalciferol 25 MCG (1000 UT) capsule Take by mouth.   [DISCONTINUED] clopidogrel (PLAVIX) 75 MG tablet TAKE 1 TABLET (75 MG TOTAL) BY MOUTH DAILY.   [DISCONTINUED] valsartan (DIOVAN) 80 MG tablet Take 1 tablet (80 mg total) by mouth daily.    Allergies  Allergen Reactions   Morphine And Related Other (See Comments)    HEADACHE    SOCIAL HISTORY/FAMILY HISTORY   Social History   Tobacco Use   Smoking status: Former Smoker    Packs/day: 0.00    Years: 50.00    Pack years: 0.00    Types: Cigarettes   Smokeless tobacco: Former Systems developer    Types: Chew    Tobacco comment: Quit in 2011 but restarted mid 2012,uses E-cigarettes; Not actually smoking, but does have 2nd hand exposure.  Substance Use Topics   Alcohol use: Yes    Alcohol/week: 3.0 standard drinks    Types: 3 Standard drinks or equivalent per week    Comment: 3-6 Beers per week   Drug use: No   Social History   Social History Narrative   He currently lives with girlfriend. He has 2 children from his ex-wife.   Still smokes  less than a quarter pack a day. He goes back and forth between having quit in smoking again.   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 He is hoping that he can increase his hours at least one of those jobs and up to to get him back on an insurance plan.    Family History family history includes Cancer in his maternal grandmother; Diabetes in his paternal grandfather and paternal grandmother; Heart failure (age of onset: 42) in his father; Kidney failure in his brother; Lung cancer in his mother; Stroke in his maternal grandfather and paternal grandfather.   OBJCTIVE -PE, EKG, labs   Wt Readings from Last 3 Encounters:  03/18/19 170 lb 6.4 oz (77.3 kg)  03/11/18 172 lb (78 kg)  02/27/17 169 lb (76.7 kg)    Physical Exam: BP 139/86    Pulse 90    Temp (!) 97.5 F (36.4 C)    Ht 5\' 8"  (1.727 m)    Wt 170 lb 6.4 oz (77.3 kg)    SpO2 98%    BMI 25.91 kg/m  Physical Exam  Constitutional: He is oriented to person, place, and time. He appears well-developed and well-nourished. No distress.  Healthy-appearing gentleman.  No acute distress.  He has white hair, but appears younger than stated age.  Well-groomed  HENT:  Head: Normocephalic and atraumatic.  Eyes: EOM are normal.  Neck: Normal range of motion. Neck supple. No hepatojugular reflux and no JVD present. Carotid bruit is not present.  Cardiovascular: Normal rate, regular rhythm, S1 normal, S2 normal, intact distal pulses and normal  pulses. Exam reveals distant heart sounds. Exam reveals no gallop and no friction rub.  Murmur heard. High-pitched blowing holosystolic murmur is present with a grade of 1/6 at the apex. Pulmonary/Chest: Effort normal and breath sounds normal. No respiratory distress. He has no wheezes. He has no rales.  Abdominal: Soft. Bowel sounds are normal. He exhibits no distension. There is no abdominal tenderness.  Musculoskeletal: Normal range of motion.        General: No edema.  Neurological: He is alert and oriented to person, place, and time.  Psychiatric: He has a normal mood and affect. His behavior is normal. Judgment and thought content normal.  Lackadaisical.  Vitals reviewed.   Adult ECG Report  Rate: 90 ;  Rhythm: normal sinus rhythm and Left atrial enlargement.  Cannot exclude lateral and inferior MI, age un-determined.;   Narrative Interpretation: Stable EKG  Recent Labs:  Last Care Everywhere-June/2018: TC 174, TG 142, HDL 36, LDL 110. 02/26/2018 -scanned in chart  Na+ 132, K+ 4.4, Cl- 102, HCO3- 25 , BUN 23, Cr 0.76, Glu 309, Ca2+ 8.7; AST 14, ALT 21, AlkP 97  CBC: W 10.4, H/H 14.4/41.9, Plt 181; A1c 9.0  TC 132, TG 229, HDL 31, LDL 55   ASSESSMENT/PLAN    Problem List Items Addressed This Visit    Ischemic Cardiomyopathy - EF ~35 -40%; Class I-II CHF (Chronic)    EF did improve with PCI in the LAD and CABG.  He is somewhat reticent in taking blood pressure medications or any other medications.  He is currently back on carvedilol 25 mg in the morning and 12 point 5 at night along with valsartan 80 mg.  (Actually he has not taken valsartan a couple days)  Because of cost, we did not put him on Entresto.  He was not interested in ICD.  Relevant Medications   valsartan (DIOVAN) 80 MG tablet   rosuvastatin (CRESTOR) 40 MG tablet   carvedilol (COREG) 12.5 MG tablet   Other Relevant Orders   EKG 12-Lead   Lipid panel   Comprehensive metabolic panel   CBC   TSH    Hemoglobin A1c   Hepatic function panel   Lipid panel   CAD - s/p CABG x 17 Mar 2010. Abnormal Myoview-LAD & D1 disease 06/2011 Rx'd with Promus DES - Primary (Chronic)    He had on revascularized proximal LAD territory going to major second diagonal branch that was then stented for abnormal Myoview back in 2013. No further anginal symptoms.  He is very active with exercise and lots of walking.  Has not had any angina or heart failure symptoms.  Has been reluctant to consider screening Myoview stress test.--He always states concerns for financial issues.  Plan: Continue current dose of carvedilol, valsartan, rosuvastatin and aspirin/Plavix.  Okay to hold both aspirin and Plavix for any procedures or surgeries 5-7 days preop.      Relevant Medications   valsartan (DIOVAN) 80 MG tablet   rosuvastatin (CRESTOR) 40 MG tablet   carvedilol (COREG) 12.5 MG tablet   Other Relevant Orders   EKG 12-Lead   Lipid panel   Comprehensive metabolic panel   CBC   TSH   Hemoglobin A1c   Hepatic function panel   Lipid panel   Essential hypertension (Chronic)    Pressures a little bit high today, but he has not been on his ARB for couple days.  We will simply continue with his current doses of carvedilol and losartan and monitor.   Checking CBC and TSH for routine surveillance.      Relevant Medications   valsartan (DIOVAN) 80 MG tablet   rosuvastatin (CRESTOR) 40 MG tablet   carvedilol (COREG) 12.5 MG tablet   Other Relevant Orders   Lipid panel   Comprehensive metabolic panel   CBC   TSH   Hemoglobin A1c   Hepatic function panel   Lipid panel   Hyperlipidemia associated with type 2 diabetes mellitus (HCC) (Chronic)    LDL was well controlled in 2019 with down the to 55.  Have not had labs checked in a while.  He has been off his statin for almost half of this past year. We need to reassess labs and determine whether he will continue on current dose of rosuvastatin or need to potentially add  additional medication.  We will check lipids now, and adjust medications based on results and then reassess in 4 months..   Is now on Januvia-but still concerned with cost.  He had been on Jardiance in the past and did not tolerate it. Would like to consider may be GLP-2 agonist if he was intolerant of SGLT2 inhibitor.  The biggest issue remains cost.      Relevant Medications   sitaGLIPtin (JANUVIA) 50 MG tablet   valsartan (DIOVAN) 80 MG tablet   rosuvastatin (CRESTOR) 40 MG tablet   Other Relevant Orders   Lipid panel   Comprehensive metabolic panel   CBC   TSH   Hemoglobin A1c   Hepatic function panel   Lipid panel   Tobacco dependence (Chronic)    Finally no longer smoking, but still has nicotine dependence using his electronic cigarette and nicotine lozenges but much less than before.      Relevant Orders   Lipid panel   Comprehensive metabolic panel   CBC   TSH  Hemoglobin A1c   Hepatic function panel   Lipid panel   NSTEMI (non-ST elevated myocardial infarction) (HCC) (Chronic)    He had a pretty significant non-ST elevation MI with acute combined systolic and diastolic heart failure class IV symptoms back in 2013.  Underwent CABG at the time and has been doing fine ever since.  Had PCI of the LAD and a diagonal branch for abnormal Myoview with an attempt to improve his EF which it did up to 35-40%.  No further angina or heart failure symptoms now.      Relevant Medications   valsartan (DIOVAN) 80 MG tablet   rosuvastatin (CRESTOR) 40 MG tablet   carvedilol (COREG) 12.5 MG tablet   Other Relevant Orders   EKG 12-Lead   Lipid panel   Comprehensive metabolic panel   CBC   TSH   Hemoglobin A1c   Hepatic function panel   Lipid panel   Diabetes mellitus type 2 in nonobese (HCC) (Chronic)    Off medications for a while now.  Will check A1c and recommend that he follows back up with his PCP.      Relevant Medications   sitaGLIPtin (JANUVIA) 50 MG tablet    valsartan (DIOVAN) 80 MG tablet   rosuvastatin (CRESTOR) 40 MG tablet   Other Relevant Orders   Lipid panel   Comprehensive metabolic panel   CBC   TSH   Hemoglobin A1c   Hepatic function panel   Lipid panel   S/P CABG x 3 (Chronic)    As per previous discussions, because of financial issues, he is wanting to hold off on any stress testing unless he has symptoms.      Relevant Orders   EKG 12-Lead   Lipid panel   Comprehensive metabolic panel   CBC   TSH   Hemoglobin A1c   Hepatic function panel   Lipid panel   S/P MVR (mitral valve repair) (Chronic)    Minimal mitral murmur heard.  We can hold off on following up echoes unless he has worsening murmur.      Relevant Orders   EKG 12-Lead   Lipid panel   Comprehensive metabolic panel   CBC   TSH   Hemoglobin A1c   Hepatic function panel   Lipid panel   Presence of drug coated stent in LAD coronary artery (Chronic)    Remains on aspirin and Plavix.  Okay to hold 5-7 days preop for procedures.      Relevant Orders   EKG 12-Lead   Lipid panel   Comprehensive metabolic panel   CBC   TSH   Hemoglobin A1c   Hepatic function panel   Lipid panel       COVID-19 Education: The signs and symptoms of COVID-19 were discussed with the patient and how to seek care for testing (follow up with PCP or arrange E-visit).   The importance of social distancing was discussed today.  I spent a total of 55minutes with the patient and chart review. >  50% of the time was spent in direct patient consultation.  Additional time spent with chart review (studies, outside notes, etc): 7 Total Time: 25 min   Current medicines are reviewed at length with the patient today.  (+/- concerns) has been off most of his medications for almost 8 months.  Not taking ARB currently.   Patient Instructions / Medication Changes & Studies & Tests Ordered   Patient Instructions  Medication Instructions:  NO MEDICATION CHANGES  *If  you need a  refill on your cardiac medications before your next appointment, please call your pharmacy*  Lab Work:  this month  Please have fasting labs CMP LIPID TSH HGBA1C CBC  IN 4 MONTHS FASTING LABS LIPID HEPATIC PANEL   If you have labs (blood work) drawn today and your tests are completely normal, you will receive your results only by:  MyChart Message (if you have MyChart) OR  A paper copy in the mail If you have any lab test that is abnormal or we need to change your treatment, we will call you to review the results.  Testing/Procedures: Not needed  Follow-Up: At [facility], you and your health needs are our priority.  As part of our continuing mission to provide you with exceptional heart care, we have created designated Provider Care Teams.  These Care Teams include your primary Cardiologist (physician) and Advanced Practice Providers (APPs -  Physician Assistants and Nurse Practitioners) who all work together to provide you with the care you need, when you need it.  Your next appointment:   12 month(s)  The format for your next appointment:   In Person  Provider:   Glenetta Hew, MD  Other Instructions     Studies Ordered:   Orders Placed This Encounter  Procedures   Lipid panel   Comprehensive metabolic panel   CBC   TSH   Hemoglobin A1c   Hepatic function panel   Lipid panel   EKG 12-Lead     Glenetta Hew, M.D., M.S. Interventional Cardiologist   Pager # [phone] Phone # [phone] [address] [place], [place] [address]   Thank you for choosing Heartcare at [facility]!!

## 2019-03-18 NOTE — Patient Instructions (Signed)
Medication Instructions:  NO MEDICATION CHANGES  *If you need a refill on your cardiac medications before your next appointment, please call your pharmacy*  Lab Work:  this month  Please have fasting labs CMP LIPID TSH HGBA1C CBC  IN 4 MONTHS FASTING LABS LIPID HEPATIC PANEL   If you have labs (blood work) drawn today and your tests are completely normal, you will receive your results only by: Marland Kitchen MyChart Message (if you have MyChart) OR . A paper copy in the mail If you have any lab test that is abnormal or we need to change your treatment, we will call you to review the results.  Testing/Procedures: Not needed  Follow-Up: At [facility], you and your health needs are our priority.  As part of our continuing mission to provide you with exceptional heart care, we have created designated Provider Care Teams.  These Care Teams include your primary Cardiologist (physician) and Advanced Practice Providers (APPs -  Physician Assistants and Nurse Practitioners) who all work together to provide you with the care you need, when you need it.  Your next appointment:   12 month(s)  The format for your next appointment:   In Person  Provider:   Glenetta Hew, MD  Other Instructions

## 2019-03-19 ENCOUNTER — Encounter: Payer: Self-pay | Admitting: Cardiology

## 2019-03-19 NOTE — Assessment & Plan Note (Signed)
Off medications for a while now.  Will check A1c and recommend that he follows back up with his PCP.

## 2019-03-19 NOTE — Assessment & Plan Note (Signed)
Remains on aspirin and Plavix.  Okay to hold 5-7 days preop for procedures.

## 2019-03-19 NOTE — Assessment & Plan Note (Addendum)
He had a pretty significant non-ST elevation MI with acute combined systolic and diastolic heart failure class IV symptoms back in 2013.  Underwent CABG at the time and has been doing fine ever since.  Had PCI of the LAD and a diagonal branch for abnormal Myoview with an attempt to improve his EF which it did up to 35-40%.  No further angina or heart failure symptoms now.

## 2019-03-19 NOTE — Assessment & Plan Note (Signed)
EF did improve with PCI in the LAD and CABG.  He is somewhat reticent in taking blood pressure medications or any other medications.  He is currently back on carvedilol 25 mg in the morning and 12 point 5 at night along with valsartan 80 mg.  (Actually he has not taken valsartan a couple days)  Because of cost, we did not put him on Entresto.  He was not interested in ICD.

## 2019-03-19 NOTE — Assessment & Plan Note (Addendum)
LDL was well controlled in 2019 with down the to 55.  Have not had labs checked in a while.  He has been off his statin for almost half of this past year. We need to reassess labs and determine whether he will continue on current dose of rosuvastatin or need to potentially add additional medication.  We will check lipids now, and adjust medications based on results and then reassess in 4 months..   Is now on Januvia-but still concerned with cost.  He had been on Jardiance in the past and did not tolerate it. Would like to consider may be GLP-2 agonist if he was intolerant of SGLT2 inhibitor.  The biggest issue remains cost.

## 2019-03-19 NOTE — Assessment & Plan Note (Signed)
Minimal mitral murmur heard.  We can hold off on following up echoes unless he has worsening murmur.

## 2019-03-19 NOTE — Assessment & Plan Note (Signed)
He had on revascularized proximal LAD territory going to major second diagonal branch that was then stented for abnormal Myoview back in 2013. No further anginal symptoms.  He is very active with exercise and lots of walking.  Has not had any angina or heart failure symptoms.  Has been reluctant to consider screening Myoview stress test.--He always states concerns for financial issues.  Plan: Continue current dose of carvedilol, valsartan, rosuvastatin and aspirin/Plavix.  Okay to hold both aspirin and Plavix for any procedures or surgeries 5-7 days preop.

## 2019-03-19 NOTE — Assessment & Plan Note (Addendum)
Pressures a little bit high today, but he has not been on his ARB for couple days.  We will simply continue with his current doses of carvedilol and losartan and monitor.   Checking CBC and TSH for routine surveillance.

## 2019-03-19 NOTE — Assessment & Plan Note (Signed)
Finally no longer smoking, but still has nicotine dependence using his electronic cigarette and nicotine lozenges but much less than before.

## 2019-03-19 NOTE — Assessment & Plan Note (Signed)
As per previous discussions, because of financial issues, he is wanting to hold off on any stress testing unless he has symptoms.

## 2019-04-06 LAB — CBC
Hematocrit: 46 % (ref 37.5–51.0)
Hemoglobin: 15.7 g/dL (ref 13.0–17.7)
MCH: 31.5 pg (ref 26.6–33.0)
MCHC: 34.1 g/dL (ref 31.5–35.7)
MCV: 92 fL (ref 79–97)
Platelets: 191 10*3/uL (ref 150–450)
RBC: 4.99 x10E6/uL (ref 4.14–5.80)
RDW: 12.3 % (ref 11.6–15.4)
WBC: 12.5 10*3/uL — ABNORMAL HIGH (ref 3.4–10.8)

## 2019-04-06 LAB — COMPREHENSIVE METABOLIC PANEL
ALT: 23 IU/L (ref 0–44)
AST: 21 IU/L (ref 0–40)
Albumin/Globulin Ratio: 2 (ref 1.2–2.2)
Albumin: 4.7 g/dL (ref 3.8–4.8)
Alkaline Phosphatase: 119 IU/L — ABNORMAL HIGH (ref 39–117)
BUN/Creatinine Ratio: 21 (ref 10–24)
BUN: 17 mg/dL (ref 8–27)
Bilirubin Total: 0.5 mg/dL (ref 0.0–1.2)
CO2: 22 mmol/L (ref 20–29)
Calcium: 9.6 mg/dL (ref 8.6–10.2)
Chloride: 99 mmol/L (ref 96–106)
Creatinine, Ser: 0.81 mg/dL (ref 0.76–1.27)
GFR calc Af Amer: 108 mL/min/{1.73_m2} (ref >59)
GFR calc non Af Amer: 93 mL/min/{1.73_m2} (ref >59)
Globulin, Total: 2.3 g/dL (ref 1.5–4.5)
Glucose: 252 mg/dL — ABNORMAL HIGH (ref 65–99)
Potassium: 5.5 mmol/L — ABNORMAL HIGH (ref 3.5–5.2)
Sodium: 136 mmol/L (ref 134–144)
Total Protein: 7 g/dL (ref 6.0–8.5)

## 2019-04-06 LAB — HEMOGLOBIN A1C
Est. average glucose Bld gHb Est-mCnc: 243 mg/dL
Hgb A1c MFr Bld: 10.1 % — ABNORMAL HIGH (ref 4.8–5.6)

## 2019-04-06 LAB — LIPID PANEL
Chol/HDL Ratio: 3.4 ratio (ref 0.0–5.0)
Cholesterol, Total: 143 mg/dL (ref 100–199)
HDL: 42 mg/dL (ref >39)
LDL Chol Calc (NIH): 70 mg/dL (ref 0–99)
Triglycerides: 183 mg/dL — ABNORMAL HIGH (ref 0–149)
VLDL Cholesterol Cal: 31 mg/dL (ref 5–40)

## 2019-04-06 LAB — TSH: TSH: 0.959 u[IU]/mL (ref 0.450–4.500)

## 2019-04-19 ENCOUNTER — Telehealth: Payer: Self-pay | Admitting: Cardiology

## 2019-04-19 DIAGNOSIS — I255 Ischemic cardiomyopathy: Secondary | ICD-10-CM

## 2019-04-19 DIAGNOSIS — E875 Hyperkalemia: Secondary | ICD-10-CM

## 2019-04-19 NOTE — Telephone Encounter (Signed)
I think we address this in the in basket.   Glenetta Hew, MD

## 2019-04-19 NOTE — Telephone Encounter (Signed)
New message   Patient would like a return call to go over lab results.

## 2019-04-19 NOTE — Telephone Encounter (Signed)
Pt aware of labs results and the need to f/u with PCP re elevated HGB A1C. Will forward to Dr Ellyn Hack for recommendations .Adonis Housekeeper

## 2019-04-20 NOTE — Telephone Encounter (Signed)
-----   Message from Leonie Man, MD sent at 04/19/2019  8:00 AM EST ----- Cholesterol levels show pretty good results.  Triglycerides are little bit high and I has to do with not eating very well.  Glucose levels also very high makes you wonder if this is true fasting.  This would correlate with diabetes.  Otherwise LDL looks pretty good/stable. Kidney function looks normal.  Potassium is a little bit high.  Let us hold valsartan for 3 days and recheck BMP in about a week.  Glenetta Hew, MD

## 2019-04-20 NOTE — Telephone Encounter (Signed)
Spoke to patient . He states  He has not been taking  glimepiride or januvia since his prescription ran out  Due to the cost until he started with medicare  - and has not made an appointment to get hem refill.   RN inform patient of results and instructed him to contact his primary to  Get an appointment .  patient is aware  That he will  Not take valsartan for 3 days  Come in  one  week - for labs  BMP - instruction given  voiced understanding about labs and following up with primary .

## 2019-05-04 ENCOUNTER — Ambulatory Visit: Payer: Medicare Other | Attending: Internal Medicine

## 2019-05-04 DIAGNOSIS — Z23 Encounter for immunization: Secondary | ICD-10-CM | POA: Insufficient documentation

## 2019-05-04 NOTE — Progress Notes (Signed)
   Covid-19 Vaccination Clinic  Name:  Eric Bradshaw    MRN: [id number] DOB: 11-19-1955  05/04/2019  [name] was observed post Covid-19 immunization for 30 minutes based on pre-vaccination screening without incidence. He was provided with Vaccine Information Sheet and instruction to access the V-Safe system.   [name] was instructed to call 911 with any severe reactions post vaccine: Marland Kitchen Difficulty breathing  . Swelling of your face and throat  . A fast heartbeat  . A bad rash all over your body  . Dizziness and weakness    Immunizations Administered    Name Date Dose VIS Date Route   Pfizer COVID-19 Vaccine 05/04/2019  6:58 PM 0.3 mL 03/26/2019 Intramuscular   Manufacturer: [place]   Lot: [id number]   [place]: [id number]

## 2019-05-25 ENCOUNTER — Ambulatory Visit: Payer: Medicare Other | Attending: Internal Medicine

## 2019-05-25 ENCOUNTER — Ambulatory Visit: Payer: Medicare Other

## 2019-05-25 DIAGNOSIS — Z23 Encounter for immunization: Secondary | ICD-10-CM | POA: Insufficient documentation

## 2019-05-25 NOTE — Progress Notes (Signed)
   Covid-19 Vaccination Clinic  Name:  Eric Bradshaw    MRN: [id number] DOB: 1955/05/07  05/25/2019  Mr. Eric Bradshaw was observed post Covid-19 immunization for 15 minutes without incidence. He was provided with Vaccine Information Sheet and instruction to access the V-Safe system.   Mr. Eric Bradshaw was instructed to call 911 with any severe reactions post vaccine: Marland Kitchen Difficulty breathing  . Swelling of your face and throat  . A fast heartbeat  . A bad rash all over your body  . Dizziness and weakness    Immunizations Administered    Name Date Dose VIS Date Route   Pfizer COVID-19 Vaccine 05/25/2019  8:30 AM 0.3 mL 03/26/2019 Intramuscular   Manufacturer: [place]   Lot: [id number]   [place]: [id number]

## 2020-03-23 ENCOUNTER — Other Ambulatory Visit: Payer: Self-pay | Admitting: Cardiology

## 2020-04-05 ENCOUNTER — Other Ambulatory Visit: Payer: Self-pay | Admitting: Cardiology

## 2020-04-18 ENCOUNTER — Ambulatory Visit (INDEPENDENT_AMBULATORY_CARE_PROVIDER_SITE_OTHER): Payer: Medicare Other | Admitting: Cardiology

## 2020-04-18 ENCOUNTER — Other Ambulatory Visit: Payer: Self-pay

## 2020-04-18 ENCOUNTER — Encounter: Payer: Self-pay | Admitting: Cardiology

## 2020-04-18 VITALS — BP 123/72 | HR 81 | Ht 67.5 in | Wt 160.6 lb

## 2020-04-18 DIAGNOSIS — Z9889 Other specified postprocedural states: Secondary | ICD-10-CM | POA: Diagnosis not present

## 2020-04-18 DIAGNOSIS — E785 Hyperlipidemia, unspecified: Secondary | ICD-10-CM

## 2020-04-18 DIAGNOSIS — E1169 Type 2 diabetes mellitus with other specified complication: Secondary | ICD-10-CM

## 2020-04-18 DIAGNOSIS — I214 Non-ST elevation (NSTEMI) myocardial infarction: Secondary | ICD-10-CM

## 2020-04-18 DIAGNOSIS — I255 Ischemic cardiomyopathy: Secondary | ICD-10-CM

## 2020-04-18 DIAGNOSIS — Z951 Presence of aortocoronary bypass graft: Secondary | ICD-10-CM | POA: Diagnosis not present

## 2020-04-18 DIAGNOSIS — Z955 Presence of coronary angioplasty implant and graft: Secondary | ICD-10-CM

## 2020-04-18 DIAGNOSIS — I2510 Atherosclerotic heart disease of native coronary artery without angina pectoris: Secondary | ICD-10-CM

## 2020-04-18 DIAGNOSIS — I10 Essential (primary) hypertension: Secondary | ICD-10-CM

## 2020-04-18 DIAGNOSIS — Z9861 Coronary angioplasty status: Secondary | ICD-10-CM

## 2020-04-18 NOTE — Patient Instructions (Signed)
Medication Instructions:  TAKE valsartan daily at lunch   *If you need a refill on your cardiac medications before your next appointment, please call your pharmacy*   Follow-Up: At [facility], you and your health needs are our priority.  As part of our continuing mission to provide you with exceptional heart care, we have created designated Provider Care Teams.  These Care Teams include your primary Cardiologist (physician) and Advanced Practice Providers (APPs -  Physician Assistants and Nurse Practitioners) who all work together to provide you with the care you need, when you need it.  We recommend signing up for the patient portal called "MyChart".  Sign up information is provided on this After Visit Summary.  MyChart is used to connect with patients for Virtual Visits (Telemedicine).  Patients are able to view lab/test results, encounter notes, upcoming appointments, etc.  Non-urgent messages can be sent to your provider as well.   To learn more about what you can do with MyChart, go to [email or web address].    Your next appointment:   12 month(s)  The format for your next appointment:   In Person  Provider:   You may see Bryan Lemma, MD or one of the following Advanced Practice Providers on your designated Care Team:    Theodore Demark, PA-C  Joni Reining, DNP, ANP    Other Instructions

## 2020-04-18 NOTE — Progress Notes (Signed)
Primary Care Provider: Drosinis, Eric Leyden, PA-C Cardiologist: Glenetta Hew, MD Electrophysiologist: None  Clinic Note: Chief Complaint  Patient presents with   Coronary Artery Disease    No angina   Cardiomyopathy    No CHF symptoms. Was started on Jardiance by PCP-changed from [place]. Tolerating better.   Follow-up    Annual    Problem List Items Addressed This Visit    Ischemic Cardiomyopathy - EF ~35 -40%; Class I-II CHF - Primary (Chronic)    He has ischemic cardiomyopathy with reduced EF, has declined ICD evaluation. He is a borderline anyway with EF of 35 to 40%.  NYHA class I CHF symptoms with no PND, orthopnea or edema. He has not been on any diuretics.  Plan:   No diuretic requirement-however he is on Jardiance which does provide some diuresis. -> I explained to the conversion from Januvia to [place] is appropriate given his cardiomyopathy.  Not on Entresto because of cost issues-he is on Valsartan 80 mg daily along with carvedilol 12.5 mg twice daily.  Spironolactone, had hyperkalemia in the past.  He has not had an echocardiogram evaluation in some time, but is not having any adverse symptoms.       Relevant Orders   EKG 12-Lead (Completed)   CAD - s/p CABG x 17 Mar 2010. Abnormal Myoview-LAD & D1 disease 06/2011 Rx'd with Promus DES (Chronic)    Multivessel CAD with CABG and PCI of the grafted LAD after diagonal territory. He has not had a stress test since his PCI, and has not had any angina. He has been reluctant to agree to screening diabetes test centimeters of financial issues. In the absence of symptoms, we agreed to hold off on further studies.  Plan:   Continue beta-blocker and ARB at current doses.  Continue rosuvastatin.  Continue aspirin and Plavix for now, but okay to hold or stop aspirin.      Relevant Orders   EKG 12-Lead (Completed)   Essential hypertension (Chronic)    Blood pressure looks good today on current meds. No  change.  He is having a little bit of fatigue issues and little bit of dizziness in the morning, I recommend that he takes his carvedilol in the morning and evening and switch losartan to lunchtime.      Relevant Orders   EKG 12-Lead (Completed)   Hyperlipidemia associated with type 2 diabetes mellitus (HCC) (Chronic)    Back on statin. Most recent labs showed well-controlled LDL and total cholesterol panel. Triglycerides are little bit high but improved.  A1c slowly improving was 10.5 in March of last year and now down to 7.5 as of October. This was after converting from [place] to [place].- -- Vania Rea has double benefit of improved glycemic control but also key indication for cardiovascular disease.      Relevant Medications   JARDIANCE 25 MG TABS tablet   glimepiride (AMARYL) 4 MG tablet   Other Relevant Orders   EKG 12-Lead (Completed)   NSTEMI (non-ST elevated myocardial infarction) (HCC) (Chronic)    We're now 10 years out from his presentation with non-STEMI, multivessel CAD with severe acute combined heart failure.  Since his his CABG, he has been doing relatively well, and this improved after PCI to the LAD and the diagonal. No recurrent anginal symptoms. EF improved some with the final PCI, and titration of medications.  No recurrent angina or CHF symptoms, despite reduced EF.      Relevant Orders   EKG 12-Lead (Completed)  S/P CABG x 3 (Chronic)    As per discussion, he would prefer to avoid screening stress test at this point. As long as he is asymptomatic, we will hold off on testing.      Relevant Orders   EKG 12-Lead (Completed)   S/P MVR (mitral valve repair) (Chronic)    Minimal murmur heard on exam. For now we can hold off on echocardiogram unless there is a change in symptoms or exam.      Relevant Orders   EKG 12-Lead (Completed)   Presence of drug coated stent in LAD coronary artery (Chronic)    He is a pretty long stent going into the diagonal  branch from the LAD.   He is on Plavix 75 mg daily along with aspirin-if he has bad bruising, okay to stop aspirin.  Okay to hold both aspirin and Plavix 5 days preop for surgery to procedures.      Relevant Orders   EKG 12-Lead (Completed)      HPI:    Eric Bradshaw is a 65 y.o. 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 Moderately dilated LV with distal septal-apical, mid and basal inferior akinesis; status post mitral valve ring. Significant diastolic gradient with mild MR. Moderate LA dilation -> not interested in ICD.  Has done well with no CHF or angina Sx: Not on Entresto because of financial concerns.  [name] was last seen on March 18, 2019--as usual feeling fine no major issues. Still working two jobs at the [employer]. Also very busy at home doing yard work etc. Was noted some allergy related coughing but nothing else. Cutting down cigarette levels. Clearly admits to eating a poor diet. He has been off medications for several months including ARB.  Restarted Diovan 80 mg daily, carvedilol 12 mg twice daily and rosuvastatin 40 mg daily.  Recent Hospitalizations: N/A  Reviewed  CV studies:    The following studies were reviewed today: (if available, images/films reviewed: From Epic Chart or Care Everywhere)  None:  Interval History:   Eric Bradshaw returns here today for annual follow-up doing  well. He is still working his two jobs, and burning the candle at both ends. Only thing he notes that he gets tired a little earlier than he used to. He said that his PCP switched him from [place] to [place], and since then he is actually feeling better. Breathing has been better and his sugar control improved.  No cardiac symptoms besides somewhat easier than before fatigue.  He still has cough in the morning, still smoking a couple cigarettes a day. Notes hair loss.  He says that he is taking his medications now.  CV Review of Symptoms (Summary): no chest pain or dyspnea on exertion positive for - Cough, more than usual fatigue negative for - edema, irregular heartbeat, orthopnea, palpitations, paroxysmal nocturnal dyspnea, rapid heart rate, shortness of breath or  Lightheadedness or dizziness, syncope/near syncope or TIA/amaurosis fugax claudication  The patient does not have symptoms concerning for COVID-19 infection (fever, chills, cough, or new shortness of breath).   REVIEWED OF SYSTEMS   Review of Systems  Constitutional: Negative for malaise/fatigue (Stat will be easy radiation today. Also is very busy.) and weight loss.  HENT: Negative for congestion (Not congested anymore, but he was congested during his URI illness 3 weeks ago.) and nosebleeds.   Respiratory: Positive for cough (Sometimes in the morning.). Negative for shortness of breath.        3 weeks ago he had 48-hour flulike illness associated with cough, low-grade fevers and myalgias-symptoms waned after 2 days.  Gastrointestinal: Negative for abdominal pain, blood in stool and melena.  Genitourinary: Negative for frequency and hematuria.  Musculoskeletal: Positive for joint pain. Negative for falls and myalgias.  Neurological: Negative for dizziness, focal weakness and headaches.  Endo/Heme/Allergies: Positive for environmental allergies. Bruises/bleeds easily.   I have reviewed and (if needed) personally updated the  patient's problem list, medications, allergies, past medical and surgical history, social and family history.   PAST MEDICAL HISTORY   Past Medical History:  Diagnosis Date   CAD S/P percutaneous coronary angioplasty 03/2010 -- 3-07/2011   Proximal LAD ~80% (into D2) & mid - subtotal occlusion , RCA occlusion, LCx occlusion -- s/p CABG x 3  (LIMA-LAD, SVG-OM, SVG-RPDA); Cath 06/2011, patent grafts with severe proximal  LAD - D1 lesions -- 07/2011: PCI  proxLAD-into D2 with Promus Premier DES 2.5 mm x 38 mm (distal in D2 2.5 mm, @ bifurcation - 2.75 m, in prox LAD ~3.0 mm   Chronic combined systolic and diastolic HF (heart failure), NYHA class 2 ([place]) 03/2010; 08/2103   a) EF ~30-30%; Exertional Dyspnea; No exaccerbations.;; b)EF 35-40%, Mod LA dilation   COPD (chronic obstructive pulmonary disease) (HCC)    DM (diabetes mellitus) type II controlled peripheral vascular disorder 2013   & CAD   HTN, goal below 130/80 03/2010   Hyperlipidemia LDL goal < 70    Ischemic cardiomyopathy 03/2010; May 2015   a) Echo - EF 30-40%; mild/mod anterior wall hypokinesis; doppler flow suggestive of impaired LV relaxation; LA moderately dilated; annuloplasty ring noted in mitral position; mild/mod mitral regurgitation; RVsystolic pressure elevated at 30-36mmHg;;; b) Echo May 2015: EF 35-40%   NSTEMI (non-ST elevated myocardial infarction) ([place]) [id number]   Complicated by Severe Acute Systolic CHF -- Class IV CHF,   S/P CABG x 3 03/2010   LIMA-mLAD, SVG-OM, SVG-rPDA   S/P MVR (mitral valve repair) 03/2010   @ time of CABG for ischemic MR - 26 mm Edwards physio-2 annuloplasty ring   Tobacco dependence    Initially quit - restarted ~mid 2012; now essentially quit.    PAST SURGICAL HISTORY   Past Surgical History:  Procedure Laterality Date   CORONARY ARTERY BYPASS GRAFT  03/2010   LIMA-LAD, SVG-OM, SVG-RPDA   Left Arm Surgery - NOS     LEFT HEART CATHETERIZATION WITH CORONARY/GRAFT ANGIOGRAM   07/03/2011   Procedure: LEFT HEART CATHETERIZATION WITH Beatrix Fetters;  Surgeon: Leonie Man, MD;  Location: [facility] CATH LAB;  Service: Cardiovascular;Patent LIMA-LAD, SVG-OM, SVG-RPDA; (native RCA & OM prox 100%, prox LAD into major D2 ~80% long lesion, midLAD 100%   MITRAL VALVULOPLASTY  03/2010   conjuntive with CABG for Severe MR; 26 mm Edwards physio-2 annuloplasty ring   NM MYOVIEW LTD  04/24/2011   R/L MV - EF 37%; mild perfusion defect due  to attenuation w/mild to mod superimposed ischemia seen in apex, apical lateral and distal to mid ateroseptal region; LV systolic fcn moderately reduced; hypotensive BP response to stress w/o chest pain; high risk scan   PERCUTANEOUS CORONARY STENT INTERVENTION (PCI-S) N/A 08/02/2011   Procedure: PERCUTANEOUS CORONARY STENT INTERVENTION (PCI-S);  Surgeon: Leonie Man, MD;  Location: [facility] CATH LAB;  Service: Cardiovascular;  PCI proximal LAD-D2: Promus Premier DES 2.5 x 38 (tapered post-dilation from 3.0 mm in proximal LAD, 2.75 mm at bifurcation and 2.5 mm in D2)   TONSILLECTOMY     Childhood   TRANSTHORACIC ECHOCARDIOGRAM  03/23/2010    Pre-CABG/MVR: EF 25-30%; global HK; Mod-Severe MR   TRANSTHORACIC ECHOCARDIOGRAM  09/2011; 08/2013   a) EF up to 30-40% (post PCI); mild-mod Ant wall HK; Gd 1 DD; MV Annuloplasty ring in place, mild-mod MR; mild PHTN (30-40 mmHg);; b)  EF 35-40% with moderately reduced function. Moderately dilated LV with distal septal-apical, mid and basal inferior akinesis; status post mitral valve ring. Significant diastolic gradient with mild MR. Moderate LA dilation.      Immunization History  Administered Date(s) Administered   PFIZER SARS-COV-2 Vaccination 05/04/2019, 05/25/2019   -> He actually just had his booster shot done  MEDICATIONS/ALLERGIES   Current Meds  Medication Sig   aspirin EC 81 MG tablet Take 81 mg by mouth daily.   B Complex-C (SUPER B COMPLEX PO) Take 1 tablet by mouth daily.   Calcium  Carbonate-Vit D-Min (CALCIUM 1200 PO) Take 1,200 mg by mouth daily.   carvedilol (COREG) 12.5 MG tablet TAKE 1/2 TABLET BY MOUTH IN THE MORNING AND 1 TABLET AT NIGHT   cholecalciferol (VITAMIN D) 1000 UNITS tablet Take 5,000 Units by mouth daily.   clopidogrel (PLAVIX) 75 MG tablet TAKE 1 TABLET(75 MG) BY MOUTH DAILY   glimepiride (AMARYL) 4 MG tablet Take 4 mg by mouth 2 (two) times daily.   JARDIANCE 25 MG TABS tablet Take 25 mg by mouth daily.   MAGNESIUM OXIDE PO Take 500 mg by mouth daily.   Multiple Vitamins-Minerals (MULTIVITAMINS THER. W/MINERALS) TABS Take 1 tablet by mouth daily.   Omega-3 Fatty Acids (FISH OIL) 1200 MG CAPS Take 2,400 mg by mouth daily.   rosuvastatin (CRESTOR) 40 MG tablet Take 1 tablet (40 mg total) by mouth daily.   valsartan (DIOVAN) 80 MG tablet TAKE 1 TABLET(80 MG) BY MOUTH DAILY   vitamin C (ASCORBIC ACID) 500 MG tablet Take 1,500 mg by mouth daily.   vitamin E 1000 UNIT capsule Take 1,000 Units by mouth daily.   Zinc Sulfate (ZINC 15 PO) Take 15 mg by mouth daily.   [DISCONTINUED] glimepiride (AMARYL) 2 MG tablet Take 1 tablet (2 mg total) by mouth daily with breakfast.   [DISCONTINUED] sitaGLIPtin (JANUVIA) 50 MG tablet Take 50 mg by mouth daily.    Allergies  Allergen Reactions   Morphine And Related Other (See Comments)    HEADACHE    SOCIAL HISTORY/FAMILY HISTORY   Reviewed in Epic:  Pertinent findings:  Social History   Tobacco Use   Smoking status: Former Smoker    Packs/day: 0.00    Years: 50.00    Pack years: 0.00    Types: Cigarettes   Smokeless tobacco: Former Systems developer    Types: Chew   Tobacco comment: Quit in 2011 but restarted mid 2012,uses E-cigarettes; Not actually smoking, but does have 2nd hand exposure.  Vaping Use   Vaping Use: Every day   Start date: 02/28/2016  Substance  Use Topics   Alcohol use: Yes    Alcohol/week: 3.0 standard drinks    Types: 3 Standard drinks or equivalent per week    Comment:  3-6 Beers per week   Drug use: No   Social History   Social History Narrative   He currently lives with girlfriend. He has 2 children from his ex-wife.   Still smokes less than a quarter pack a day. He goes back and forth between having quit in smoking again.   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 He is hoping that he can increase his hours at least one of those jobs and up to to get him back on an insurance plan.    OBJCTIVE -PE, EKG, labs   Wt Readings from Last 3 Encounters:  04/18/20 160 lb 9.6 oz (72.8 kg)  03/18/19 170 lb 6.4 oz (77.3 kg)  03/11/18 172 lb (78 kg)    Physical Exam: BP 123/72    Pulse 81    Ht 5' 7.5" (1.715 m)    Wt 160 lb 9.6 oz (72.8 kg)    SpO2 100%    BMI 24.78 kg/m  Physical Exam Vitals reviewed.  Constitutional:      General: He is not in acute distress.    Appearance: Normal appearance. He is normal weight. He is not ill-appearing (Healthy-appearing. Well-groomed) or toxic-appearing.     Comments: He looks a little tired tired-since he was up late last night  HENT:     Head: Normocephalic and atraumatic.  Neck:     Vascular: No carotid bruit, hepatojugular reflux or JVD.  Cardiovascular:     Rate and Rhythm: Normal rate and regular rhythm.  No extrasystoles are present.    Pulses: Normal pulses and intact distal pulses.     Heart sounds: S1 normal and S2 normal. Heart sounds are distant. Murmur (1/6 high-pitched blowing HSM at apex.) heard.  No friction rub. No gallop.   Pulmonary:     Effort: Pulmonary effort is normal. No respiratory distress.     Breath sounds: Normal breath sounds.  Chest:     Chest wall: No tenderness.  Musculoskeletal:        General: No swelling. Normal range of motion.     Cervical back: Normal range of motion and neck supple.  Skin:    General: Skin is warm and dry.  Neurological:     General: No focal deficit present.     Mental Status: He  is alert and oriented to person, place, and time. Mental status is at baseline.  Psychiatric:        Mood and Affect: Mood normal.        Thought Content: Thought content normal.        Judgment: Judgment normal.     Adult ECG Report  Rate: 81;  Rhythm: normal sinus rhythm and Borderline today for LVH-IVCD. Cannot rule out septal MI, age-indeterminate.;   Narrative Interpretation: Stable EKG.  Recent Labs:  -just tested last yr by PCP.   Lipid Profile Component 01/21/20 06/11/19 05/07/19 02/26/18  LDL Direct 47 -- -- --  Total Cholesterol 100 128 131 132  Triglycerides 204 382High 279High 229High  HDL Cholesterol 32 32Low 31Low 31Low  Total Chol / HDL Cholesterol 3.1 4.0 4.2 4.3   Non-HDL Cholesterol 68  96  100  101   Lipid Profile Component 01/21/20 06/11/19 05/07/19 02/26/18  LDL Direct  47 -- -- --  Total Cholesterol 100 128 131 132  Triglycerides 204 382High 279High 229High  HDL Cholesterol 32 32Low 31Low 31Low  Total Chol / HDL Cholesterol 3.1 4.0 4.2 4.3   Non-HDL Cholesterol 68  96  100  101   POCT HEMOGLOBIN A1C Component 01/21/20 10/22/19 07/05/19  HEMOGLOBIN A1C, POC 7.5 7.9Abnormal 10.5Abnormal     Lab Results  Component Value Date   CHOL 143 04/05/2019   HDL 42 04/05/2019   LDLCALC 70 04/05/2019   TRIG 183 (H) 04/05/2019   CHOLHDL 3.4 04/05/2019   Lab Results  Component Value Date   CREATININE 0.81 04/05/2019   BUN 17 04/05/2019   NA 136 04/05/2019   K 5.5 (H) 04/05/2019   CL 99 04/05/2019   CO2 22 04/05/2019    Lab Results  Component Value Date   TSH 0.959 04/05/2019    ASSESSMENT/PLAN    Problem List Items Addressed This Visit    Ischemic Cardiomyopathy - EF ~35 -40%; Class I-II CHF - Primary (Chronic)    He has ischemic cardiomyopathy with reduced EF, has declined ICD evaluation. He is a borderline anyway with EF of 35 to 40%.  NYHA class I CHF symptoms with no PND, orthopnea or edema. He has not been on  any diuretics.  Plan:   No diuretic requirement-however he is on Jardiance which does provide some diuresis. -> I explained to the conversion from Januvia to [place] is appropriate given his cardiomyopathy.  Not on Entresto because of cost issues-he is on Valsartan 80 mg daily along with carvedilol 12.5 mg twice daily.  Spironolactone, had hyperkalemia in the past.  He has not had an echocardiogram evaluation in some time, but is not having any adverse symptoms.       Relevant Orders   EKG 12-Lead (Completed)   CAD - s/p CABG x 17 Mar 2010. Abnormal Myoview-LAD & D1 disease 06/2011 Rx'd with Promus DES (Chronic)    Multivessel CAD with CABG and PCI of the grafted LAD after diagonal territory. He has not had a stress test since his PCI, and has not had any angina. He has been reluctant to agree to screening diabetes test centimeters of financial issues. In the absence of symptoms, we agreed to hold off on further studies.  Plan:   Continue beta-blocker and ARB at current doses.  Continue rosuvastatin.  Continue aspirin and Plavix for now, but okay to hold or stop aspirin.      Relevant Orders   EKG 12-Lead (Completed)   Essential hypertension (Chronic)    Blood pressure looks good today on current meds. No change.  He is having a little bit of fatigue issues and little bit of dizziness in the morning, I recommend that he takes his carvedilol in the morning and evening and switch losartan to lunchtime.      Relevant Orders   EKG 12-Lead (Completed)   Hyperlipidemia associated with type 2 diabetes mellitus (HCC) (Chronic)    Back on statin. Most recent labs showed well-controlled LDL and total cholesterol panel. Triglycerides are little bit high but improved.  A1c slowly improving was 10.5 in March of last year and now down to 7.5 as of October. This was after converting from [place] to [place].- -- Vania Rea has double benefit of improved glycemic control but also key  indication for cardiovascular disease.      Relevant Medications   JARDIANCE 25 MG TABS tablet   glimepiride (AMARYL) 4 MG tablet   Other  Relevant Orders   EKG 12-Lead (Completed)   NSTEMI (non-ST elevated myocardial infarction) (HCC) (Chronic)    We're now 10 years out from his presentation with non-STEMI, multivessel CAD with severe acute combined heart failure.  Since his his CABG, he has been doing relatively well, and this improved after PCI to the LAD and the diagonal. No recurrent anginal symptoms. EF improved some with the final PCI, and titration of medications.  No recurrent angina or CHF symptoms, despite reduced EF.      Relevant Orders   EKG 12-Lead (Completed)   S/P CABG x 3 (Chronic)    As per discussion, he would prefer to avoid screening stress test at this point. As long as he is asymptomatic, we will hold off on testing.      Relevant Orders   EKG 12-Lead (Completed)   S/P MVR (mitral valve repair) (Chronic)    Minimal murmur heard on exam. For now we can hold off on echocardiogram unless there is a change in symptoms or exam.      Relevant Orders   EKG 12-Lead (Completed)   Presence of drug coated stent in LAD coronary artery (Chronic)    He is a pretty long stent going into the diagonal branch from the LAD.   He is on Plavix 75 mg daily along with aspirin-if he has bad bruising, okay to stop aspirin.  Okay to hold both aspirin and Plavix 5 days preop for surgery to procedures.      Relevant Orders   EKG 12-Lead (Completed)      COVID-19 Education: The signs and symptoms of COVID-19 were discussed with the patient and how to seek care for testing (follow up with PCP or arrange E-visit).   The importance of social distancing and COVID-19 vaccination was discussed today. The patient is practicing social distancing & Masking.   I spent a total of 25 minutes with the patient spent in direct patient consultation.  Additional time spent with chart  review  / charting (studies, outside notes, etc): 12 min Total Time: 37 min   Current medicines are reviewed at length with the patient today.  (+/- concerns) n/a  This visit occurred during the SARS-CoV-2 public health emergency.  Safety protocols were in place, including screening questions prior to the visit, additional usage of staff PPE, and extensive cleaning of exam room while observing appropriate contact time as indicated for disinfecting solutions.  Notice: This dictation was prepared with Dragon dictation along with smaller phrase technology. Any transcriptional errors that result from this process are unintentional and may not be corrected upon review.  Patient Instructions / Medication Changes & Studies & Tests Ordered   Patient Instructions  Medication Instructions:  TAKE valsartan daily at lunch   *If you need a refill on your cardiac medications before your next appointment, please call your pharmacy*   Follow-Up: At [facility], you and your health needs are our priority.  As part of our continuing mission to provide you with exceptional heart care, we have created designated Provider Care Teams.  These Care Teams include your primary Cardiologist (physician) and Advanced Practice Providers (APPs -  Physician Assistants and Nurse Practitioners) who all work together to provide you with the care you need, when you need it.  We recommend signing up for the patient portal called "MyChart".  Sign up information is provided on this After Visit Summary.  MyChart is used to connect with patients for Virtual Visits (Telemedicine).  Patients are able  to view lab/test results, encounter notes, upcoming appointments, etc.  Non-urgent messages can be sent to your provider as well.   To learn more about what you can do with MyChart, go to [email or web address].    Your next appointment:   12 month(s)  The format for your next appointment:   In Person  Provider:   You may see  Glenetta Hew, MD or one of the following Advanced Practice Providers on your designated Care Team:    Rosaria Ferries, PA-C  Jory Sims, DNP, ANP    Other Instructions      Studies Ordered:   Orders Placed This Encounter  Procedures   EKG 12-Lead     Glenetta Hew, M.D., M.S. Interventional Cardiologist   Pager # [phone] Phone # [phone] [address] [place], [place] [address]   Thank you for choosing Heartcare at [facility]!!

## 2020-04-23 ENCOUNTER — Encounter: Payer: Self-pay | Admitting: Cardiology

## 2020-04-23 NOTE — Assessment & Plan Note (Signed)
Minimal murmur heard on exam. For now we can hold off on echocardiogram unless there is a change in symptoms or exam.

## 2020-04-23 NOTE — Assessment & Plan Note (Signed)
We're now 10 years out from his presentation with non-STEMI, multivessel CAD with severe acute combined heart failure.  Since his his CABG, he has been doing relatively well, and this improved after PCI to the LAD and the diagonal. No recurrent anginal symptoms. EF improved some with the final PCI, and titration of medications.  No recurrent angina or CHF symptoms, despite reduced EF.

## 2020-04-23 NOTE — Assessment & Plan Note (Signed)
He has ischemic cardiomyopathy with reduced EF, has declined ICD evaluation. He is a borderline anyway with EF of 35 to 40%.  NYHA class I CHF symptoms with no PND, orthopnea or edema. He has not been on any diuretics.  Plan:   No diuretic requirement-however he is on Jardiance which does provide some diuresis. -> I explained to the conversion from Januvia to [place] is appropriate given his cardiomyopathy.  Not on Entresto because of cost issues-he is on Valsartan 80 mg daily along with carvedilol 12.5 mg twice daily.  Spironolactone, had hyperkalemia in the past.  He has not had an echocardiogram evaluation in some time, but is not having any adverse symptoms.

## 2020-04-23 NOTE — Assessment & Plan Note (Signed)
He is a pretty long stent going into the diagonal branch from the LAD.   He is on Plavix 75 mg daily along with aspirin-if he has bad bruising, okay to stop aspirin.  Okay to hold both aspirin and Plavix 5 days preop for surgery to procedures.

## 2020-04-23 NOTE — Assessment & Plan Note (Addendum)
Blood pressure looks good today on current meds. No change.  He is having a little bit of fatigue issues and little bit of dizziness in the morning, I recommend that he takes his carvedilol in the morning and evening and switch losartan to lunchtime.

## 2020-04-23 NOTE — Assessment & Plan Note (Addendum)
Multivessel CAD with CABG and PCI of the grafted LAD after diagonal territory. He has not had a stress test since his PCI, and has not had any angina. He has been reluctant to agree to screening diabetes test centimeters of financial issues. In the absence of symptoms, we agreed to hold off on further studies.  Plan:   Continue beta-blocker and ARB at current doses.  Continue rosuvastatin.  Continue aspirin and Plavix for now, but okay to hold or stop aspirin.

## 2020-04-23 NOTE — Assessment & Plan Note (Signed)
Back on statin. Most recent labs showed well-controlled LDL and total cholesterol panel. Triglycerides are little bit high but improved.  A1c slowly improving was 10.5 in March of last year and now down to 7.5 as of October. This was after converting from [place] to [place].- -- Eric Bradshaw has double benefit of improved glycemic control but also key indication for cardiovascular disease.

## 2020-04-23 NOTE — Assessment & Plan Note (Signed)
As per discussion, he would prefer to avoid screening stress test at this point. As long as he is asymptomatic, we will hold off on testing.

## 2020-05-02 ENCOUNTER — Other Ambulatory Visit: Payer: Self-pay | Admitting: Cardiology

## 2020-12-04 ENCOUNTER — Other Ambulatory Visit: Payer: Self-pay | Admitting: Urology

## 2020-12-04 ENCOUNTER — Telehealth: Payer: Self-pay | Admitting: Cardiology

## 2020-12-04 MED ORDER — GEMCITABINE CHEMO FOR BLADDER INSTILLATION 2000 MG: 2000.0000 mg | Freq: Once | INTRAVENOUS | Status: DC

## 2020-12-04 NOTE — Telephone Encounter (Signed)
   [facility] Pre-operative Risk Assessment    Request for surgical clearance:  What type of surgery is being performed?  Bladder Tumor Removal  When is this surgery scheduled?  12/26/20  What type of clearance is required (medical clearance vs. Pharmacy clearance to hold med vs. Both)?  Both   Are there any medications that need to be held prior to surgery and how long? Plavix 1 week prior Aspirin 5 days prior   Practice name and name of physician performing surgery?  [facility]  Dr. Claudia Desanctis  What is your office phone number? [phone] (ext#: [phone])    7.   What is your office fax number? [phone]  8.   Anesthesia type (None, local, MAC, general) ?  General    Zara Council 12/04/2020, 4:13 PM

## 2020-12-04 NOTE — Telephone Encounter (Addendum)
   Name: Eric Bradshaw  DOB: 1955-12-09  MRN: [id number]   Primary Cardiologist: Glenetta Hew, MD  Chart reviewed as part of pre-operative protocol coverage. Patient was contacted 12/04/2020 in reference to pre-operative risk assessment for pending surgery as outlined below.  Eric Bradshaw was last seen on 04/2020 by Dr. Ellyn Hack with complex CV history as outlined with multivessel CAD s/p CABG, PCI, ischemic cardiomyopathy, CHF, HTN, HLD, MV repair at time of CABG, DM amongst other medical history as outlined. Last echo 2015 EF 35-40%. RCRI 6.6% indicating moderate CV risk of complications. I reached out to patient for update on how he is doing. The patient affirms he has been doing well without any new cardiac symptoms. He walks regularly at work throughout the day for hours over a 2 sq mile property without angina or dyspnea. Therefore, based on ACC/AHA guidelines, the patient would be at acceptable risk for the planned procedure without further cardiovascular testing. The patient was advised that if he develops new symptoms prior to surgery to contact our office to arrange for a follow-up visit, and he verbalized understanding.  Per Dr. Allison Quarry last note, Okay to hold both aspirin and Plavix for any procedures or surgeries 5-7 days preop.  Will route this bundled recommendation to requesting provider via Epic fax function. Please call with questions.  Charlie Pitter, PA-C 12/04/2020, 5:02 PM

## 2020-12-21 NOTE — Progress Notes (Signed)
Left voice mail message with selita at [facility] ef 35 to 40 % per [facility] guidelines must be [facility] to [facility]

## 2020-12-25 ENCOUNTER — Encounter (HOSPITAL_COMMUNITY): Payer: Self-pay | Admitting: Urology

## 2020-12-25 ENCOUNTER — Encounter (HOSPITAL_COMMUNITY): Payer: Self-pay

## 2020-12-25 ENCOUNTER — Other Ambulatory Visit: Payer: Self-pay

## 2020-12-25 ENCOUNTER — Encounter (HOSPITAL_COMMUNITY)
Admission: RE | Admit: 2020-12-25 | Discharge: 2020-12-25 | Disposition: A | Discharge status: Home / Self Care | Payer: Medicare Other | Source: Ambulatory Visit | Attending: Urology | Admitting: Urology

## 2020-12-25 DIAGNOSIS — I5042 Chronic combined systolic (congestive) and diastolic (congestive) heart failure: Secondary | ICD-10-CM | POA: Insufficient documentation

## 2020-12-25 DIAGNOSIS — Z951 Presence of aortocoronary bypass graft: Secondary | ICD-10-CM | POA: Diagnosis not present

## 2020-12-25 DIAGNOSIS — I110 Hypertensive heart disease with heart failure: Secondary | ICD-10-CM | POA: Diagnosis not present

## 2020-12-25 DIAGNOSIS — Z79899 Other long term (current) drug therapy: Secondary | ICD-10-CM | POA: Diagnosis not present

## 2020-12-25 DIAGNOSIS — Z7902 Long term (current) use of antithrombotics/antiplatelets: Secondary | ICD-10-CM | POA: Insufficient documentation

## 2020-12-25 DIAGNOSIS — Z7984 Long term (current) use of oral hypoglycemic drugs: Secondary | ICD-10-CM | POA: Insufficient documentation

## 2020-12-25 DIAGNOSIS — I255 Ischemic cardiomyopathy: Secondary | ICD-10-CM | POA: Diagnosis not present

## 2020-12-25 DIAGNOSIS — Z01812 Encounter for preprocedural laboratory examination: Secondary | ICD-10-CM | POA: Diagnosis not present

## 2020-12-25 DIAGNOSIS — I2510 Atherosclerotic heart disease of native coronary artery without angina pectoris: Secondary | ICD-10-CM | POA: Insufficient documentation

## 2020-12-25 DIAGNOSIS — N3289 Other specified disorders of bladder: Secondary | ICD-10-CM | POA: Insufficient documentation

## 2020-12-25 DIAGNOSIS — Z7901 Long term (current) use of anticoagulants: Secondary | ICD-10-CM | POA: Insufficient documentation

## 2020-12-25 DIAGNOSIS — Z7982 Long term (current) use of aspirin: Secondary | ICD-10-CM | POA: Insufficient documentation

## 2020-12-25 DIAGNOSIS — E118 Type 2 diabetes mellitus with unspecified complications: Secondary | ICD-10-CM | POA: Insufficient documentation

## 2020-12-25 HISTORY — DX: Disorder of arteries and arterioles, unspecified: I77.9

## 2020-12-25 LAB — CBC
HCT: 42.8 % (ref 39.0–52.0)
Hemoglobin: 14.1 g/dL (ref 13.0–17.0)
MCH: 30.7 pg (ref 26.0–34.0)
MCHC: 32.9 g/dL (ref 30.0–36.0)
MCV: 93.2 fL (ref 80.0–100.0)
Platelets: 287 10*3/uL (ref 150–400)
RBC: 4.59 MIL/uL (ref 4.22–5.81)
RDW: 12.4 % (ref 11.5–15.5)
WBC: 14.3 10*3/uL — ABNORMAL HIGH (ref 4.0–10.5)
nRBC: 0 % (ref 0.0–0.2)

## 2020-12-25 LAB — BASIC METABOLIC PANEL
Anion gap: 9 (ref 5–15)
BUN: 23 mg/dL (ref 8–23)
CO2: 23 mmol/L (ref 22–32)
Calcium: 9.2 mg/dL (ref 8.9–10.3)
Chloride: 104 mmol/L (ref 98–111)
Creatinine, Ser: 0.69 mg/dL (ref 0.61–1.24)
GFR, Estimated: >60 mL/min (ref >60)
Glucose, Bld: 187 mg/dL — ABNORMAL HIGH (ref 70–99)
Potassium: 5.2 mmol/L — ABNORMAL HIGH (ref 3.5–5.1)
Sodium: 136 mmol/L (ref 135–145)

## 2020-12-25 LAB — HEMOGLOBIN A1C
Hgb A1c MFr Bld: 7.3 % — ABNORMAL HIGH (ref 4.8–5.6)
Mean Plasma Glucose: 162.81 mg/dL

## 2020-12-25 LAB — GLUCOSE, CAPILLARY: Glucose-Capillary: 171 mg/dL — ABNORMAL HIGH (ref 70–99)

## 2020-12-25 NOTE — Anesthesia Preprocedure Evaluation (Addendum)
Anesthesia Evaluation  Patient identified by MRN, date of birth, ID band Patient awake    Reviewed: Allergy & Precautions, NPO status , Patient's Chart, lab work & pertinent test results  Airway Mallampati: I  TM Distance: >3 FB Neck ROM: Full    Dental no notable dental hx. (+) Edentulous Upper, Edentulous Lower   Pulmonary former smoker,    Pulmonary exam normal breath sounds clear to auscultation       Cardiovascular hypertension, Pt. on medications and Pt. on home beta blockers + CAD, + Past MI, + Cardiac Stents (2013) and + CABG (2011)  Normal cardiovascular exam Rhythm:Regular Rate:Normal  2015 Echo EF 35-40%   Neuro/Psych negative neurological ROS  negative psych ROS   GI/Hepatic negative GI ROS, Neg liver ROS,   Endo/Other  diabetes  Renal/GU Lab Results      Component                Value               Date                      CREATININE               0.69                12/25/2020                BUN                      23                  12/25/2020                NA                       136                 12/25/2020                K                        5.2 (H)             12/25/2020                CL                       104                 12/25/2020                CO2                      23                  12/25/2020                Musculoskeletal negative musculoskeletal ROS (+)   Abdominal   Peds  Hematology Lab Results      Component                Value               Date                      WBC  14.3 (H)            12/25/2020                HGB                      14.1                12/25/2020                HCT                      42.8                12/25/2020                MCV                      93.2                12/25/2020                PLT                      287                 12/25/2020              Anesthesia Other Findings    Reproductive/Obstetrics                           Anesthesia Physical Anesthesia Plan  ASA: 3  Anesthesia Plan: General   Post-op Pain Management:    Induction: Intravenous  PONV Risk Score and Plan: Treatment may vary due to age or medical condition, Ondansetron, Midazolam and Dexamethasone  Airway Management Planned: Oral ETT  Additional Equipment: None  Intra-op Plan:   Post-operative Plan: Extubation in OR  Informed Consent:     Dental advisory given  Plan Discussed with:   Anesthesia Plan Comments: (See APP note by Durel Salts, FNP )      Anesthesia Quick Evaluation

## 2020-12-25 NOTE — Progress Notes (Addendum)
COVID swab appointment: N/A  COVID Vaccine Completed: Yes x2 Date COVID Vaccine completed: 05-04-19 05-25-19 Has received booster:  Yes x1 COVID vaccine manufacturer: [place]     Date of COVID positive in last 90 days: No  PCP - Does not have one Cardiologist - Glenetta Hew, MD  Cardiac clearance in Epic dated 12-04-20 by Melina Copa, PA-C  Chest x-ray - N/A EKG - 04-18-20 Epic Stress Test - greater than 2 years.  Epic ECHO - greater than 2 years.  Epic Cardiac Cath - greater than 2 years Pacemaker/ICD device last checked: Spinal Cord Stimulator:  Sleep Study - N/A CPAP -   Fasting Blood Sugar - 115- 285 Checks Blood Sugar- rarely checks  Blood Thinner Instructions:  Plavix.  Okay to hold 5 to 7 days. Last dose 12-19-20 Aspirin Instructions:  ASA 81 mg.  Okay to hold 5 to 7 days   Last Dose: 12-19-20  Activity level:  Can go up a flight of stairs and perform activities of daily living without stopping and without symptoms of chest pain or shortness of breath.    Anesthesia review: Cardiomyopathy, CAD, Hx of MI, Hx of CABG, Hx of mitral valve repair, COPD, HTN,. DM  Patient denies shortness of breath, fever, cough and chest pain at PAT appointment   Patient verbalized understanding of instructions that were given to them at the PAT appointment. Patient was also instructed that they will need to review over the PAT instructions again at home before surgery.

## 2020-12-25 NOTE — Progress Notes (Signed)
Anesthesia Chart Review:   Case: [id number] Date/Time: 12/26/20 1200   Procedure: TRANSURETHRAL RESECTION OF BLADDER TUMOR WITH GEMCITABINE   Anesthesia type: General   Pre-op diagnosis: BLADDER MASS   Location: [place] [facility]   Surgeons: Robley Fries, MD       DISCUSSION: Pt is 65 years old with hx CAD (s/p CABG x3 2011; s/p DES to LAD 2013), s/p MV repair 2011 (at time of CABG), ischemic cardiomyopathy (EF 35-40% 2015), CHF, HTN, DM, COPD, carotid artery disease (moderate by 2017 [place])  Pt to hold plavix and ASA 5-7 days before surgery   VS: BP 137/73   Pulse 77   Temp 36.9 C (Oral)   Resp 18   Ht '5\' 7"'$  (1.702 m)   Wt 69.1 kg   SpO2 100%   BMI 23.87 kg/m   PROVIDERS: - PCP is Eric Lucks, PA-C - Cardiologist is Eric Hew, MD. Last office visit 04/18/20. Cleared for surgery by Melina Copa, PA at acceptable risk on 12/04/20   LABS: Labs reviewed: Acceptable for surgery. (all labs ordered are listed, but only abnormal results are displayed)  Labs Reviewed  HEMOGLOBIN A1C - Abnormal; Notable for the following components:      Result Value   Hgb A1c MFr Bld 7.3 (*)    All other components within normal limits  BASIC METABOLIC PANEL - Abnormal; Notable for the following components:   Potassium 5.2 (*)    Glucose, Bld 187 (*)    All other components within normal limits  CBC - Abnormal; Notable for the following components:   WBC 14.3 (*)    All other components within normal limits  GLUCOSE, CAPILLARY - Abnormal; Notable for the following components:   Glucose-Capillary 171 (*)    All other components within normal limits    EKG 04/18/20: NSR. LVH with repolarization abnormality. Cannot rule out septal infarct, age undetermined   CV:  Carotid duplex 02/21/16:  - Heterogeneous plaque, bilaterally. - 40-59% bilateral ICA stenosis. - Normal subclavian arteries, bilaterally. - Patent vertebral arteries with antegrade flow.  Echo 08/25/13:  -  Left ventricle: Distal septal apical , mid and basal inferior akinesis The cavity size was moderately dilated. Wall thickness was normal. Systolic function was moderately reduced. The estimated ejection fraction was in the range of 35% to 40%.  - Mitral valve: S/P mitral valve ring? significant diastolic gradient and mild residual MR Mild regurgitation. Valve area by continuity equation (using LVOT flow): 1.41cm^2.  - Left atrium: The atrium was moderately dilated.  - Atrial septum: No defect or patent foramen ovale was identified.    Past Medical History:  Diagnosis Date   CAD S/P percutaneous coronary angioplasty 03/2010 -- 3-07/2011   Proximal LAD ~80% (into D2) & mid - subtotal occlusion , RCA occlusion, LCx occlusion -- s/p CABG x 3  (LIMA-LAD, SVG-OM, SVG-RPDA); Cath 06/2011, patent grafts with severe proximal  LAD - D1 lesions -- 07/2011: PCI  proxLAD-into D2 with Promus Premier DES 2.5 mm x 38 mm (distal in D2 2.5 mm, @ bifurcation - 2.75 m, in prox LAD ~3.0 mm   Carotid artery disease (HCC)    moderate by 2017 duplex US   Chronic combined systolic and diastolic HF (heart failure), NYHA class 2 ([place]) 03/2010; 08/2103   a) EF ~30-30%; Exertional Dyspnea; No exaccerbations.;; b)EF 35-40%, Mod LA dilation   COPD (chronic obstructive pulmonary disease) (HCC)    DM (diabetes mellitus) type II controlled peripheral vascular  disorder 04/16/2011   & CAD   HTN, goal below 130/80 03/15/2010   Hyperlipidemia LDL goal < 70    Ischemic cardiomyopathy 03/2010; May 2015   a) Echo - EF 30-40%; mild/mod anterior wall hypokinesis; doppler flow suggestive of impaired LV relaxation; LA moderately dilated; annuloplasty ring noted in mitral position; mild/mod mitral regurgitation; RVsystolic pressure elevated at 30-48mHg;;; b) Echo May 2015: EF 35-40%   NSTEMI (non-ST elevated myocardial infarction) ([place] [id number]  Complicated by Severe Acute Systolic CHF -- Class IV CHF,   S/P CABG x 3 03/15/2010    LIMA-mLAD, SVG-OM, SVG-rPDA   S/P MVR (mitral valve repair) 03/15/2010   @ time of CABG for ischemic MR - 26 mm Edwards physio-2 annuloplasty ring   Tobacco dependence    Initially quit - restarted ~mid 2012; now essentially quit.    Past Surgical History:  Procedure Laterality Date   CORONARY ARTERY BYPASS GRAFT  03/2010   LIMA-LAD, SVG-OM, SVG-RPDA   Left Arm Surgery - NOS     LEFT HEART CATHETERIZATION WITH CORONARY/GRAFT ANGIOGRAM  07/03/2011   Procedure: LEFT HEART CATHETERIZATION WITH CBeatrix Fetters  Surgeon: DLeonie Man MD;  [facility];  Service: Cardiovascular;Patent LIMA-LAD, SVG-OM, SVG-RPDA; (native RCA & OM prox 100%, prox LAD into major D2 ~80% long lesion, midLAD 100%   MITRAL VALVULOPLASTY  03/2010   conjuntive with CABG for Severe MR; 26 mm Edwards physio-2 annuloplasty ring   NM MYOVIEW LTD  04/24/2011   R/L MV - EF 37%; mild perfusion defect due to attenuation w/mild to mod superimposed ischemia seen in apex, apical lateral and distal to mid ateroseptal region; LV systolic fcn moderately reduced; hypotensive BP response to stress w/o chest pain; high risk scan   PERCUTANEOUS CORONARY STENT INTERVENTION (PCI-S) N/A 08/02/2011   Procedure: PERCUTANEOUS CORONARY STENT INTERVENTION (PCI-S);  Surgeon: DLeonie Man MD;  [facility];  Service: Cardiovascular;  PCI proximal LAD-D2: Promus Premier DES 2.5 x 38 (tapered post-dilation from 3.0 mm in proximal LAD, 2.75 mm at bifurcation and 2.5 mm in D2)   TONSILLECTOMY     Childhood   TRANSTHORACIC ECHOCARDIOGRAM  03/23/2010    Pre-CABG/MVR: EF 25-30%; global HK; Mod-Severe MR   TRANSTHORACIC ECHOCARDIOGRAM  09/2011; 08/2013   a) EF up to 30-40% (post PCI); mild-mod Ant wall HK; Gd 1 DD; MV Annuloplasty ring in place, mild-mod MR; mild PHTN (30-40 mmHg);; b)  EF 35-40% with moderately reduced function. Moderately dilated LV with distal septal-apical, mid and basal inferior akinesis; status post  mitral valve ring. Significant diastolic gradient with mild MR. Moderate LA dilation.      MEDICATIONS:  Ascorbic Acid (VITAMIN C) 1000 MG tablet   aspirin EC 81 MG tablet   B Complex-C (SUPER B COMPLEX PO)   carvedilol (COREG) 12.5 MG tablet   Cholecalciferol (VITAMIN D-3) 125 MCG (5000 UT) TABS   clopidogrel (PLAVIX) 75 MG tablet   Echinacea 4A999333MG CAPS   Garlic (GARLIQUE PO)   glimepiride (AMARYL) 4 MG tablet   ibuprofen (ADVIL) 200 MG tablet   JARDIANCE 25 MG TABS tablet   Magnesium 500 MG TABS   Multiple Vitamins-Minerals (MULTIVITAMINS THER. W/MINERALS) TABS   Omega-3 Fatty Acids (FISH OIL) 1000 MG CAPS   rosuvastatin (CRESTOR) 40 MG tablet   valsartan (DIOVAN) 80 MG tablet   vitamin E 1000 UNIT capsule   Zinc 50 MG TABS   No current facility-administered medications for this encounter.    gemcitabine (GEMZAR) chemo  syringe for bladder instillation 2,000 mg    - Pt to hold ASA and plavix 5-7 days before surgery.    If no changes, I anticipate pt can proceed with surgery as scheduled.   Willeen Cass, PhD, FNP-BC [facility]/Anesthesiology Phone: [phone] 12/25/2020 3:37 PM

## 2020-12-25 NOTE — H&P (Signed)
CC/HPI: cc: Gross hematuria   11/30/20: 65 year old man with intermittent painless gross hematuria since February. He also passes clots sporadically. He has a significant smoking history as been smoking at least a pack a day since he was a kid. He has a history of kidney stones from his early 15s. He does take Plavix. He reports in intermittently weak stream.     ALLERGIES: Morphine Sulfate    MEDICATIONS: Plavix 75 mg tablet  Glimepiride  Jardiance  Rosuvastatin Calcium     GU PSH: None     PSH Notes: Bypass     NON-GU PSH: Cardiac Stent Placement     GU PMH: None   NON-GU PMH: Cardiac murmur, unspecified Diabetes Type 2 Heart disease, unspecified Hypertension Myocardial Infarction Other heart failure    FAMILY HISTORY: 1 Daughter - Runs in Family 1 son - Runs in Family Kidney Failure - Runs in Family   SOCIAL HISTORY: Marital Status: Unknown Current Smoking Status: Patient does not smoke anymore. Has not smoked since 11/14/2010.   Tobacco Use Assessment Completed: Used Tobacco in last 30 days? Does drink.  Drinks 4+ caffeinated drinks per day.    REVIEW OF SYSTEMS:    GU Review Male:   Patient reports frequent urination, get up at night to urinate, and stream starts and stops. Patient denies hard to postpone urination, burning/ pain with urination, leakage of urine, trouble starting your stream, have to strain to urinate , erection problems, and penile pain.  Gastrointestinal (Upper):   Patient denies nausea, vomiting, and indigestion/ heartburn.  Gastrointestinal (Lower):   Patient reports diarrhea. Patient denies constipation.  Constitutional:   Patient reports weight loss and fatigue. Patient denies fever and night sweats.  Skin:   Patient denies skin rash/ lesion and itching.  Eyes:   Patient denies blurred vision and double vision.  Ears/ Nose/ Throat:   Patient reports sinus problems. Patient denies sore throat.  Hematologic/Lymphatic:   Patient reports easy  bruising. Patient denies swollen glands.  Cardiovascular:   Patient denies chest pains and leg swelling.  Respiratory:   Patient reports cough. Patient denies shortness of breath.  Endocrine:   Patient denies excessive thirst.  Musculoskeletal:   Patient denies back pain and joint pain.  Neurological:   Patient denies headaches and dizziness.  Psychologic:   Patient denies depression and anxiety.   VITAL SIGNS:      11/30/2020 10:45 AM  Weight 157 lb / 71.21 kg  Height 69 in / 175.26 cm  BP 147/77 mmHg  Pulse 91 /min  Temperature 96.8 F / 36 C  BMI 23.2 kg/m   MULTI-SYSTEM PHYSICAL EXAMINATION:    Constitutional: Well-nourished. No physical deformities. Normally developed. Good grooming.  Neck: Neck symmetrical, not swollen. Normal tracheal position.  Respiratory: No labored breathing, no use of accessory muscles.   Skin: No paleness, no jaundice, no cyanosis. No lesion, no ulcer, no rash.  Neurologic / Psychiatric: Oriented to time, oriented to place, oriented to person. No depression, no anxiety, no agitation.  Gastrointestinal: No rigidity, non obese abdomen.   Eyes: Normal conjunctivae. Normal eyelids.  Ears, Nose, Mouth, and Throat: Left ear no scars, no lesions, no masses. Right ear no scars, no lesions, no masses. Nose no scars, no lesions, no masses. Normal hearing. Normal lips.  Musculoskeletal: Normal gait and station of head and neck.     Complexity of Data:  Records Review:   AUA Symptom Score, Previous Patient Records, POC Tool  Urine Test Review:  Urinalysis  Urodynamics Review:   Review Bladder Scan  Notes:                     IPSS: 1, 3, 4, 1, 4, 0, 2=15/35  Unhappy 5/6 QOL   04/05/2019: BUN 17, creatinine 0.81   PROCEDURES:         Flexible Cystoscopy - 52000  Risks, benefits, and some of the potential complications of the procedure were discussed at length with the patient including infection, bleeding, voiding discomfort, urinary retention, fever, chills,  sepsis, and others. All questions were answered. Informed consent was obtained. Sterile technique and intraurethral analgesia were used.  Meatus:  Normal size. Normal location. Normal condition.  Urethra:  No strictures.  External Sphincter:  Normal.  Verumontanum:  Normal.  Prostate:  Obstructing lateral lobes.   Bladder Neck:  Non-obstructing.  Ureteral Orifices:  Unable to see efflux due to hematuria  Bladder:  Poor visualization due to hematuria. There appears to be a nodular bladder mass emanating from left superior posterior bladder wall. Clot adherent.      The lower urinary tract was carefully examined. The procedure was well-tolerated and without complications. Antibiotic instructions were given. Instructions were given to call the office immediately for bloody urine, difficulty urinating, urinary retention, painful or frequent urination, fever, chills, nausea, vomiting or other illness. The patient stated that he understood these instructions and would comply with them.        PVR Ultrasound - [id number]  Scanned Volume: 65 cc         Urinalysis w/Scope Dipstick Dipstick Cont'd Micro  Color: Red Bilirubin: Invalid mg/dL WBC/hpf: 0 - 5/hpf  Appearance: Turbid Ketones: Invalid mg/dL RBC/hpf: >60/hpf  Specific Gravity: 1.025 Blood: Invalid ery/uL Bacteria: NS (Not Seen)  pH: 6.0 Protein: Invalid mg/dL Cystals: NS (Not Seen)  Glucose: Invalid mg/dL Urobilinogen: Invalid mg/dL Casts: NS (Not Seen)    Nitrites: Invalid Trichomonas: Not Present    Leukocyte Esterase: Invalid leu/uL Mucous: Not Present      Epithelial Cells: NS (Not Seen)      Yeast: NS (Not Seen)      Sperm: Not Present    Notes: Microscopic performed on unspun sample due to QNS. No dip due to color interference.    ASSESSMENT:      ICD-10 Details  1 GU:   Gross hematuria - R31.0 Undiagnosed New Problem  2   Bladder tumor/neoplasm - D41.4 Undiagnosed New Problem   PLAN:           Orders X-Rays: C.T. Hematuria  With and Without I.V. Contrast          Schedule Labs: 1 Week - BUN/Creatinine          Document Letter(s):  Created for Patient: Clinical Summary         Notes:   Cystoscopy done the office today showed nodular bladder mass from posterior bladder wall however visualization was quite poor. I would like to schedule patient for diagnostic cystoscopy with transurethral resection of bladder tumor and possible intravesical gemcitabine. The risks and benefits of the procedure were discussed with the patient in detail. These include but are not limited to infection, pain, bleeding, bladder perforation, damage to surrounding structures, need for foley catheter, need for additiional treatment/procedure. Patient to be scheduled for CT urogram prior to surgery.

## 2020-12-25 NOTE — Patient Instructions (Addendum)
DUE TO COVID-19 ONLY ONE VISITOR IS ALLOWED TO COME WITH YOU AND STAY IN THE WAITING ROOM ONLY DURING PRE OP AND PROCEDURE.   **NO VISITORS ARE ALLOWED IN THE SHORT STAY AREA OR RECOVERY ROOM!!**        Your procedure is scheduled on:  Tuesday, 12-26-20   Report to [facility] Main  Entrance   Report to admitting at 10:00 AM   Call this number if you have problems the morning of surgery [phone]   Do not eat food :After Midnight.   May have liquids until 9:15 AM  day of surgery  CLEAR LIQUID DIET  Foods Allowed                                                                     Foods Excluded  Water, Black Coffee (no milk/no creamer) and tea, regular and decaf                              liquids that you cannot  Plain Jell-O in any flavor  (No red)                         see through such as: Fruit ices (not with fruit pulp)                                 milk, soups, orange juice  Iced Popsicles (No red)                                    All solid food                             Apple juices Sports drinks like Gatorade (No red) Lightly seasoned clear broth or consume(fat free) Sugar      Oral Hygiene is also important to reduce your risk of infection.                                    Remember - BRUSH YOUR TEETH THE MORNING OF SURGERY WITH YOUR REGULAR TOOTHPASTE   Do NOT smoke after Midnight   Take these medicines the morning of surgery with A SIP OF WATER:  Carvedilol  How to Manage Your Diabetes Before and After Surgery  Why is it important to control my blood sugar before and after surgery? Improving blood sugar levels before and after surgery helps healing and can limit problems. A way of improving blood sugar control is eating a healthy diet by:  Eating less sugar and carbohydrates  Increasing activity/exercise  Talking with your doctor about reaching your blood sugar goals High blood sugars (greater than 180 mg/dL) can raise your risk of  infections and slow your recovery, so you will need to focus on controlling your diabetes during the weeks before surgery. Make sure that the doctor who takes care of your diabetes knows about your planned surgery  including the date and location.  How do I manage my blood sugar before surgery? Check your blood sugar at least 4 times a day, starting 2 days before surgery, to make sure that the level is not too high or low. Check your blood sugar the morning of your surgery when you wake up and every 2 hours until you get to the Short Stay unit. If your blood sugar is less than 70 mg/dL, you will need to treat for low blood sugar: Do not take insulin. Treat a low blood sugar (less than 70 mg/dL) with  cup of clear juice (cranberry or apple), 4 glucose tablets, OR glucose gel. Recheck blood sugar in 15 minutes after treatment (to make sure it is greater than 70 mg/dL). If your blood sugar is not greater than 70 mg/dL on recheck, call [phone] for further instructions. Report your blood sugar to the short stay nurse when you get to Short Stay.  If you are admitted to the hospital after surgery: Your blood sugar will be checked by the staff and you will probably be given insulin after surgery (instead of oral diabetes medicines) to make sure you have good blood sugar levels. The goal for blood sugar control after surgery is 80-180 mg/dL.   WHAT DO I DO ABOUT MY DIABETES MEDICATION?  Do not take oral diabetes medicines (pills) the morning of surgery.  THE DAY BEFORE SURGERY:  Do not take Jardiance  Take Glimepiride at prescribed (no evening dose)      THE MORNING OF SURGERY:  Do not take Jardiance or Glimepiride.   Reviewed and Endorsed by [facility] Patient Education Committee, August 2015             Stop all vitamins and herbal supplements a week before surgery             You may not have any metal on your body including  jewelry, and body piercing             Do not wear  lotions, powders, cologne, or deodorant              Men may shave face and neck.  Do not bring valuables to the hospital. [place].   Contacts, dentures or bridgework may not be worn into surgery.    Patients discharged the day of surgery will not be allowed to drive home.  Please read over the following fact sheets you were given: IF YOU HAVE QUESTIONS ABOUT YOUR PRE OP INSTRUCTIONS PLEASE CALL [facility] - Preparing for Surgery Before surgery, you can play an important role.  Because skin is not sterile, your skin needs to be as free of germs as possible.  You can reduce the number of germs on your skin by washing with CHG (chlorahexidine gluconate) soap before surgery.  CHG is an antiseptic cleaner which kills germs and bonds with the skin to continue killing germs even after washing. Please DO NOT use if you have an allergy to CHG or antibacterial soaps.  If your skin becomes reddened/irritated stop using the CHG and inform your nurse when you arrive at Short Stay. Do not shave (including legs and underarms) for at least 48 hours prior to the first CHG shower.  You may shave your face/neck.  Please follow these instructions carefully:  1.  Shower with CHG Soap the night before surgery and the  morning of surgery.  2.  If you  choose to wash your hair, wash your hair first as usual with your normal  shampoo.  3.  After you shampoo, rinse your hair and body thoroughly to remove the shampoo.                             4.  Use CHG as you would any other liquid soap.  You can apply chg directly to the skin and wash.  Gently with a scrungie or clean washcloth.  5.  Apply the CHG Soap to your body ONLY FROM THE NECK DOWN.   Do   not use on face/ open                           Wound or open sores. Avoid contact with eyes, ears mouth and   genitals (private parts).                       Wash face,  Genitals (private parts) with your normal  soap.             6.  Wash thoroughly, paying special attention to the area where your    surgery  will be performed.  7.  Thoroughly rinse your body with warm water from the neck down.  8.  DO NOT shower/wash with your normal soap after using and rinsing off the CHG Soap.                9.  Pat yourself dry with a clean towel.            10.  Wear clean pajamas.            11.  Place clean sheets on your bed the night of your first shower and do not  sleep with pets. Day of Surgery : Do not apply any lotions/deodorants the morning of surgery.  Please wear clean clothes to the hospital/surgery center.  FAILURE TO FOLLOW THESE INSTRUCTIONS MAY RESULT IN THE CANCELLATION OF YOUR SURGERY  PATIENT SIGNATURE_________________________________  NURSE SIGNATURE__________________________________  ________________________________________________________________________

## 2020-12-26 ENCOUNTER — Ambulatory Visit (HOSPITAL_COMMUNITY)
Admission: RE | Admit: 2020-12-26 | Discharge: 2020-12-26 | Disposition: A | Discharge status: Home / Self Care | Payer: Medicare Other | Attending: Urology | Admitting: Urology

## 2020-12-26 ENCOUNTER — Ambulatory Visit (HOSPITAL_COMMUNITY): Payer: Medicare Other | Admitting: Anesthesiology

## 2020-12-26 ENCOUNTER — Encounter (HOSPITAL_COMMUNITY): Payer: Self-pay | Admitting: Urology

## 2020-12-26 ENCOUNTER — Encounter (HOSPITAL_COMMUNITY)
Admission: RE | Disposition: A | Discharge status: Home / Self Care | Payer: Self-pay | Source: Home / Self Care | Attending: Urology

## 2020-12-26 DIAGNOSIS — C678 Malignant neoplasm of overlapping sites of bladder: Secondary | ICD-10-CM | POA: Diagnosis not present

## 2020-12-26 DIAGNOSIS — Z87891 Personal history of nicotine dependence: Secondary | ICD-10-CM | POA: Diagnosis not present

## 2020-12-26 DIAGNOSIS — N368 Other specified disorders of urethra: Secondary | ICD-10-CM | POA: Diagnosis not present

## 2020-12-26 DIAGNOSIS — Z7984 Long term (current) use of oral hypoglycemic drugs: Secondary | ICD-10-CM | POA: Diagnosis not present

## 2020-12-26 DIAGNOSIS — Z7902 Long term (current) use of antithrombotics/antiplatelets: Secondary | ICD-10-CM | POA: Insufficient documentation

## 2020-12-26 DIAGNOSIS — R609 Edema, unspecified: Secondary | ICD-10-CM | POA: Diagnosis not present

## 2020-12-26 DIAGNOSIS — R310 Gross hematuria: Secondary | ICD-10-CM | POA: Diagnosis not present

## 2020-12-26 DIAGNOSIS — D414 Neoplasm of uncertain behavior of bladder: Secondary | ICD-10-CM | POA: Diagnosis present

## 2020-12-26 HISTORY — PX: TRANSURETHRAL RESECTION OF BLADDER TUMOR WITH MITOMYCIN-C: SHX6459

## 2020-12-26 LAB — GLUCOSE, CAPILLARY
Glucose-Capillary: 174 mg/dL — ABNORMAL HIGH (ref 70–99)
Glucose-Capillary: 158 mg/dL — ABNORMAL HIGH (ref 70–99)

## 2020-12-26 SURGERY — TRANSURETHRAL RESECTION OF BLADDER TUMOR WITH MITOMYCIN-C
Anesthesia: General

## 2020-12-26 MED ORDER — ACETAMINOPHEN 10 MG/ML IV SOLN
INTRAVENOUS | Status: AC
  Filled 2020-12-26: qty 100

## 2020-12-26 MED ORDER — LIDOCAINE 2% (20 MG/ML) 5 ML SYRINGE
INTRAMUSCULAR | Status: DC | PRN
  Administered 2020-12-26: 60 mg via INTRAVENOUS

## 2020-12-26 MED ORDER — AMISULPRIDE (ANTIEMETIC) 5 MG/2ML IV SOLN: 10.0000 mg | Freq: Once | INTRAVENOUS | Status: DC | PRN

## 2020-12-26 MED ORDER — OXYCODONE HCL 5 MG/5ML PO SOLN
5.0000 mg | Freq: Once | ORAL | Status: DC | PRN
Start: 2020-12-26 — End: 2020-12-26

## 2020-12-26 MED ORDER — ORAL CARE MOUTH RINSE
15.0000 mL | Freq: Once | OROMUCOSAL | Status: AC
  Administered 2020-12-26: 15 mL via OROMUCOSAL

## 2020-12-26 MED ORDER — SODIUM CHLORIDE 0.9 % IR SOLN
Status: DC | PRN
  Administered 2020-12-26: 9000 mL via INTRAVESICAL

## 2020-12-26 MED ORDER — CEPHALEXIN 500 MG PO CAPS: 500.0000 mg | ORAL_CAPSULE | Freq: Four times a day (QID) | ORAL | 0 refills | Status: AC

## 2020-12-26 MED ORDER — FENTANYL CITRATE (PF) 100 MCG/2ML IJ SOLN
INTRAMUSCULAR | Status: AC
  Filled 2020-12-26: qty 2

## 2020-12-26 MED ORDER — ESMOLOL HCL 100 MG/10ML IV SOLN
INTRAVENOUS | Status: DC | PRN
  Administered 2020-12-26 (×2): 10 mg via INTRAVENOUS

## 2020-12-26 MED ORDER — CHLORHEXIDINE GLUCONATE 0.12 % MT SOLN: 15.0000 mL | Freq: Once | OROMUCOSAL | Status: AC

## 2020-12-26 MED ORDER — CARVEDILOL 12.5 MG PO TABS
12.5000 mg | ORAL_TABLET | Freq: Once | ORAL | Status: AC
  Administered 2020-12-26: 12.5 mg via ORAL
  Filled 2020-12-26: qty 1

## 2020-12-26 MED ORDER — ESMOLOL HCL 100 MG/10ML IV SOLN
INTRAVENOUS | Status: AC
  Filled 2020-12-26: qty 20

## 2020-12-26 MED ORDER — ONDANSETRON HCL 4 MG/2ML IJ SOLN
INTRAMUSCULAR | Status: DC | PRN
  Administered 2020-12-26: 4 mg via INTRAVENOUS

## 2020-12-26 MED ORDER — FENTANYL CITRATE (PF) 100 MCG/2ML IJ SOLN
INTRAMUSCULAR | Status: DC | PRN
  Administered 2020-12-26 (×4): 50 ug via INTRAVENOUS

## 2020-12-26 MED ORDER — CIPROFLOXACIN IN D5W 400 MG/200ML IV SOLN
400.0000 mg | INTRAVENOUS | Status: AC
  Administered 2020-12-26: 400 mg via INTRAVENOUS
  Filled 2020-12-26: qty 200

## 2020-12-26 MED ORDER — TRAMADOL HCL 50 MG PO TABS: 50.0000 mg | ORAL_TABLET | Freq: Four times a day (QID) | ORAL | 0 refills | Status: DC | PRN

## 2020-12-26 MED ORDER — OXYCODONE HCL 5 MG PO TABS: 5.0000 mg | ORAL_TABLET | Freq: Once | ORAL | Status: DC | PRN

## 2020-12-26 MED ORDER — LACTATED RINGERS IV SOLN: INTRAVENOUS | Status: DC

## 2020-12-26 MED ORDER — HYDROMORPHONE HCL 1 MG/ML IJ SOLN
0.2500 mg | INTRAMUSCULAR | Status: DC | PRN
  Administered 2020-12-26 (×4): 0.5 mg via INTRAVENOUS

## 2020-12-26 MED ORDER — ROCURONIUM BROMIDE 10 MG/ML (PF) SYRINGE
PREFILLED_SYRINGE | INTRAVENOUS | Status: DC | PRN
  Administered 2020-12-26: 10 mg via INTRAVENOUS
  Administered 2020-12-26: 60 mg via INTRAVENOUS

## 2020-12-26 MED ORDER — PROPOFOL 10 MG/ML IV BOLUS
INTRAVENOUS | Status: AC
  Filled 2020-12-26: qty 20

## 2020-12-26 MED ORDER — ONDANSETRON HCL 4 MG/2ML IJ SOLN: 4.0000 mg | Freq: Once | INTRAMUSCULAR | Status: DC | PRN

## 2020-12-26 MED ORDER — 0.9 % SODIUM CHLORIDE (POUR BTL) OPTIME
TOPICAL | Status: DC | PRN
  Administered 2020-12-26: 1000 mL

## 2020-12-26 MED ORDER — MIDAZOLAM HCL 2 MG/2ML IJ SOLN
INTRAMUSCULAR | Status: AC
  Filled 2020-12-26: qty 2

## 2020-12-26 MED ORDER — DEXAMETHASONE SODIUM PHOSPHATE 10 MG/ML IJ SOLN
INTRAMUSCULAR | Status: DC | PRN
  Administered 2020-12-26: 5 mg via INTRAVENOUS

## 2020-12-26 MED ORDER — PHENYLEPHRINE 40 MCG/ML (10ML) SYRINGE FOR IV PUSH (FOR BLOOD PRESSURE SUPPORT)
PREFILLED_SYRINGE | INTRAVENOUS | Status: DC | PRN
  Administered 2020-12-26 (×4): 80 ug via INTRAVENOUS

## 2020-12-26 MED ORDER — SUGAMMADEX SODIUM 200 MG/2ML IV SOLN
INTRAVENOUS | Status: DC | PRN
  Administered 2020-12-26: 300 mg via INTRAVENOUS

## 2020-12-26 MED ORDER — PROPOFOL 10 MG/ML IV BOLUS
INTRAVENOUS | Status: DC | PRN
  Administered 2020-12-26: 100 mg via INTRAVENOUS
  Administered 2020-12-26: 50 mg via INTRAVENOUS

## 2020-12-26 MED ORDER — GEMCITABINE CHEMO FOR BLADDER INSTILLATION 2000 MG
2000.0000 mg | Freq: Once | INTRAVENOUS | Status: AC
  Administered 2020-12-26: 2000 mg via INTRAVESICAL
  Filled 2020-12-26: qty 2000

## 2020-12-26 MED ORDER — ACETAMINOPHEN 10 MG/ML IV SOLN
1000.0000 mg | Freq: Once | INTRAVENOUS | Status: DC | PRN
  Administered 2020-12-26: 1000 mg via INTRAVENOUS

## 2020-12-26 MED ORDER — HYDROMORPHONE HCL 1 MG/ML IJ SOLN
INTRAMUSCULAR | Status: AC
  Filled 2020-12-26: qty 2

## 2020-12-26 SURGICAL SUPPLY — 18 items
BAG DRN RND TRDRP ANRFLXCHMBR (UROLOGICAL SUPPLIES) ×1
BAG URINE DRAIN 2000ML AR STRL (UROLOGICAL SUPPLIES) ×1 IMPLANT
BAG URO CATCHER STRL LF (MISCELLANEOUS) ×2 IMPLANT
CATH FOLEY 2W COUNCIL 20FR 5CC (CATHETERS) ×1 IMPLANT
CLOTH BEACON ORANGE TIMEOUT ST (SAFETY) ×2 IMPLANT
DRAPE FOOT SWITCH (DRAPES) ×2 IMPLANT
ELECT REM PT RETURN 15FT ADLT (MISCELLANEOUS) ×1 IMPLANT
GLOVE SURG ENC MOIS LTX SZ6.5 (GLOVE) ×2 IMPLANT
GOWN STRL REUS W/TWL LRG LVL3 (GOWN DISPOSABLE) ×2 IMPLANT
KIT TURNOVER KIT A (KITS) ×2 IMPLANT
LOOP CUT BIPOLAR 24F LRG (ELECTROSURGICAL) ×1 IMPLANT
MANIFOLD NEPTUNE II (INSTRUMENTS) ×2 IMPLANT
PACK CYSTO (CUSTOM PROCEDURE TRAY) ×2 IMPLANT
SYR 30ML LL (SYRINGE) ×1 IMPLANT
SYR TOOMEY IRRIG 70ML (MISCELLANEOUS) ×2
SYRINGE TOOMEY IRRIG 70ML (MISCELLANEOUS) IMPLANT
TUBING CONNECTING 10 (TUBING) ×2 IMPLANT
TUBING UROLOGY SET (TUBING) ×2 IMPLANT

## 2020-12-26 NOTE — Discharge Instructions (Signed)

## 2020-12-26 NOTE — Interval H&P Note (Signed)
History and Physical Interval Note:  12/26/2020 11:25 AM  Eric Bradshaw  has presented today for surgery, with the diagnosis of BLADDER MASS.  The various methods of treatment have been discussed with the patient and family. After consideration of risks, benefits and other options for treatment, the patient has consented to  Procedure(s): TRANSURETHRAL RESECTION OF BLADDER TUMOR WITH GEMCITABINE (N/A) as a surgical intervention.  The patient's history has been reviewed, patient examined, no change in status, stable for surgery.  I have reviewed the patient's chart and labs.  Questions were answered to the patient's satisfaction.     Raunak Antuna D Charl Wellen

## 2020-12-26 NOTE — Op Note (Signed)
PATIENT:  Eric Bradshaw  PRE-OPERATIVE DIAGNOSIS: Bladder tumor  POST-OPERATIVE DIAGNOSIS: Same  PROCEDURE:  Procedure(s): 1. TRANSURETHRAL RESECTION OF BLADDER TUMOR (TURBT) (4cm.) 2. Instillation of intravesical chemotherapy (Gemcitabine)  SURGEON:  Jacalyn Lefevre, MD  ANESTHESIA:   General  EBL:  Minimal  DRAINS: Urethral catheter (20 Fr. Foley)   SPECIMEN:   Dome bladder tumor Posterior wall bladder tumor  DISPOSITION OF SPECIMEN:  PATHOLOGY  Findings: Normal anterior urethra Lateral lobe hypertrophy of prostatic urethra with high riding bladder neck but no intravesical median lobe Bilateral orthotopic ureteral orifices uninvolved by tumor Widespread edema and erythema difficult to discern between tumor and effects of catheter Nodular bladder tumor most prominent left dome moving anteriorly as well as posterior wall  Indication: 65 year old man who presented with painless gross hematuria found to have nodular bladder mass on cystoscopy.  Description of operation: The patient was taken to the operating room and administered general anesthesia. They were then placed on the table and moved to the dorsal lithotomy position after which the genitalia was sterilely prepped and draped. An official timeout was then performed.  The 9 French resectoscope with the 30 lens and visual obturator were then passed into the bladder under direct visualization. Urethra appeared normal. The visual obturator was then removed and the Gyrus resectoscope element with 30  lens was then inserted and the bladder was fully and systematically inspected. Ureteral orifices were noted to be in the normal anatomic positions.   The bipolar loop was then used to resect the nodular bladder tumor at the dome/left superior wall.  The Daylene Posey was then used to irrigate the bladder and remove all of the portions of bladder tumor which were sent to pathology. Erythema and edema continued to the posterior wall and  this was taken as a separate specimen. Reinspection of the bladder revealed all obvious tumor had been fully resected and there was no evidence of perforation.  I then removed the resectoscope.  A 20 French Foley catheter was then inserted in the bladder and irrigated. The irrigant returned slightly pink with no clots. The patient was awakened and taken to the recovery room.  While in the recovery room 2 g of gemcitabine in 52.6 cc of sterile water was instilled in the bladder through the catheter and the catheter was plugged. This will remain indwelling for approximately one hour. It will then be drained from the bladder and the catheter will be removed and the patient discharged home.  PLAN OF CARE: Discharge to home after PACU  PATIENT DISPOSITION:  PACU - hemodynamically stable.

## 2020-12-26 NOTE — Anesthesia Postprocedure Evaluation (Signed)
Anesthesia Post Note  Patient: Eric Bradshaw  Procedure(s) Performed: TRANSURETHRAL RESECTION OF BLADDER TUMOR WITH GEMCITABINE     Patient location during evaluation: PACU Anesthesia Type: General Level of consciousness: awake and alert, oriented and patient cooperative Pain management: pain level controlled Vital Signs Assessment: post-procedure vital signs reviewed and stable Respiratory status: spontaneous breathing, nonlabored ventilation and respiratory function stable Cardiovascular status: blood pressure returned to baseline and stable Postop Assessment: no apparent nausea or vomiting Anesthetic complications: no   No notable events documented.  Last Vitals:  Vitals:   12/26/20 1345 12/26/20 1400  BP: (!) 143/74 122/69  Pulse: 77   Resp: 14 17  Temp:    SpO2: 99%     Last Pain:  Vitals:   12/26/20 1400  TempSrc:   PainSc: [place]

## 2020-12-26 NOTE — Transfer of Care (Signed)
Immediate Anesthesia Transfer of Care Note  Patient: Eric Bradshaw  Procedure(s) Performed: Procedure(s): TRANSURETHRAL RESECTION OF BLADDER TUMOR WITH GEMCITABINE (N/A)  Patient Location: PACU  Anesthesia Type:General  Level of Consciousness:  sedated, patient cooperative and responds to stimulation  Airway & Oxygen Therapy:Patient Spontanous Breathing and Patient connected to face mask oxgen  Post-op Assessment:  Report given to PACU RN and Post -op Vital signs reviewed and stable  Post vital signs:  Reviewed and stable  Last Vitals:  Vitals:   12/26/20 0949  BP: (!) 146/76  Pulse: 84  Resp: 16  Temp: 36.9 C  SpO2: 0000000    Complications: No apparent anesthesia complications

## 2020-12-26 NOTE — Anesthesia Procedure Notes (Signed)
Procedure Name: Intubation Date/Time: 12/26/2020 12:25 PM Performed by: Lavina Hamman, CRNA Pre-anesthesia Checklist: Patient identified, Emergency Drugs available, Suction available, Patient being monitored and Timeout performed Patient Re-evaluated:Patient Re-evaluated prior to induction Oxygen Delivery Method: Circle system utilized Preoxygenation: Pre-oxygenation with 100% oxygen Induction Type: IV induction Ventilation: Mask ventilation without difficulty Laryngoscope Size: Mac and 4 Grade View: Grade I Tube type: Oral Tube size: 7.5 mm Number of attempts: 1 Airway Equipment and Method: Stylet Placement Confirmation: ETT inserted through vocal cords under direct vision, positive ETCO2, CO2 detector and breath sounds checked- equal and bilateral Secured at: 22 cm Tube secured with: Tape Dental Injury: Teeth and Oropharynx as per pre-operative assessment

## 2020-12-27 ENCOUNTER — Encounter (HOSPITAL_COMMUNITY): Payer: Self-pay | Admitting: Urology

## 2020-12-27 LAB — SURGICAL PATHOLOGY

## 2021-01-16 ENCOUNTER — Other Ambulatory Visit: Payer: Self-pay | Admitting: Urology

## 2021-01-24 NOTE — Progress Notes (Addendum)
Anesthesia Review:  PCP: does not have one until November 2022  per pt ( has appt in 11/22)  Cardiologist : DR Glenetta Hew Last clearance 12/04/20  LOV 04/18/20  Endocrinology- DR Posey Pronto 01/15/21  Chest x-ray : EKG :04/18/2020  Echo : Stress test: Cardiac Cath :  Activity level: can do a flight of stairs without diffculty  Sleep Study/ CPAP : none  Fasting Blood Sugar :      / Checks Blood Sugar -- times a day:   Blood Thinner/ Instructions /Last Dose: ASA / Instructions/ Last Dose :   Plavix and 81 mg Aspirin- Stop 7 days prior per pt  Hgba1c-7.5 on 01/15/2021  DM- type 2- checks glucose occaisonally  Last surgery 12/26/20  No covid test- ambulatory surgery  CBC done 01/26/21 routed to DR [facility].

## 2021-01-24 NOTE — Progress Notes (Signed)
DUE TO COVID-19 ONLY ONE VISITOR IS ALLOWED TO COME WITH YOU AND STAY IN THE WAITING ROOM ONLY DURING PRE OP AND PROCEDURE DAY OF SURGERY. THE 1 VISITOR  MAY VISIT WITH YOU AFTER SURGERY IN YOUR PRIVATE ROOM DURING VISITING HOURS ONLY!  YOU NEED TO HAVE A COVID 19 TEST ON_______ @_______ , THIS TEST MUST BE DONE BEFORE SURGERY,  COVID TESTING SITE IS AT [place]. PLEASE REMAIN IN YOUR CAR THIS IS A DRIVER UP TEST. AFTER YOUR COVID TEST PLEASE WEAR A MASK OUT IN PUBLIC AND SOCIAL DISTANCE AND [place] YOUR HANDS FREQUENTLY. PLEASE ASK ALL YOUR CLOSE CONTACTS TO WEAR A MASK OUT IN PUBLIC AND SOCIAL DISTANCE AND [place] HANDS FREQUENTLY ALSO.               Eric Bradshaw  01/24/2021   Your procedure is scheduled on:    01/30/21   Report to [facility] Main  Entrance   Report to admitting at     0700AM     Call this number if you have problems the morning of surgery [phone]    Remember: Do not eat food , candy gum or mints :After Midnight. You may have clear liquids from midnight until __  0615am    CLEAR LIQUID DIET   Foods Allowed                                                                       Coffee and tea, regular and decaf                              Plain Jell-O any favor except red or purple                                            Fruit ices (not with fruit pulp)                                      Iced Popsicles                                     Carbonated beverages, regular and diet                                    Cranberry, grape and apple juices Sports drinks like Gatorade Lightly seasoned clear broth or consume(fat free) Sugar   _____________________________________________________________________    BRUSH YOUR TEETH MORNING OF SURGERY AND RINSE YOUR MOUTH OUT, NO CHEWING GUM CANDY OR MINTS.     Take these medicines the morning of surgery with A SIP OF WATER:  cireg   DO NOT TAKE ANY DIABETIC MEDICATIONS DAY OF YOUR  SURGERY  You may not have any metal on your body including hair pins and              piercings  Do not wear jewelry, make-up, lotions, powders or perfumes, deodorant             Do not wear nail polish on your fingernails.  Do not shave  48 hours prior to surgery.              Men may shave face and neck.   Do not bring valuables to the hospital. Eric Bradshaw.  Contacts, dentures or bridgework may not be worn into surgery.  Leave suitcase in the car. After surgery it may be brought to your room.     Patients discharged the day of surgery will not be allowed to drive home. IF YOU ARE HAVING SURGERY AND GOING HOME THE SAME DAY, YOU MUST HAVE AN ADULT TO DRIVE YOU HOME AND BE WITH YOU FOR 24 HOURS. YOU MAY GO HOME BY TAXI OR UBER OR ORTHERWISE, BUT AN ADULT MUST ACCOMPANY YOU HOME AND STAY WITH YOU FOR 24 HOURS.  Name and phone number of your driver:  Special Instructions: N/A              Please read over the following fact sheets you were given: _____________________________________________________________________  [facility] - Preparing for Surgery Before surgery, you can play an important role.  Because skin is not sterile, your skin needs to be as free of germs as possible.  You can reduce the number of germs on your skin by washing with CHG (chlorahexidine gluconate) soap before surgery.  CHG is an antiseptic cleaner which kills germs and bonds with the skin to continue killing germs even after washing. Please DO NOT use if you have an allergy to CHG or antibacterial soaps.  If your skin becomes reddened/irritated stop using the CHG and inform your nurse when you arrive at Short Stay. Do not shave (including legs and underarms) for at least 48 hours prior to the first CHG shower.  You may shave your face/neck. Please follow these instructions carefully:  1.  Shower with CHG Soap the night before surgery and the   morning of Surgery.  2.  If you choose to wash your hair, wash your hair first as usual with your  normal  shampoo.  3.  After you shampoo, rinse your hair and body thoroughly to remove the  shampoo.                           4.  Use CHG as you would any other liquid soap.  You can apply chg directly  to the skin and wash                       Gently with a scrungie or clean washcloth.  5.  Apply the CHG Soap to your body ONLY FROM THE NECK DOWN.   Do not use on face/ open                           Wound or open sores. Avoid contact with eyes, ears mouth and genitals (private parts).  Wash face,  Genitals (private parts) with your normal soap.             6.  Wash thoroughly, paying special attention to the area where your surgery  will be performed.  7.  Thoroughly rinse your body with warm water from the neck down.  8.  DO NOT shower/wash with your normal soap after using and rinsing off  the CHG Soap.                9.  Pat yourself dry with a clean towel.            10.  Wear clean pajamas.            11.  Place clean sheets on your bed the night of your first shower and do not  sleep with pets. Day of Surgery : Do not apply any lotions/deodorants the morning of surgery.  Please wear clean clothes to the hospital/surgery center.  FAILURE TO FOLLOW THESE INSTRUCTIONS MAY RESULT IN THE CANCELLATION OF YOUR SURGERY PATIENT SIGNATURE_________________________________  NURSE SIGNATURE__________________________________  ________________________________________________________________________

## 2021-01-26 ENCOUNTER — Other Ambulatory Visit: Payer: Self-pay

## 2021-01-26 ENCOUNTER — Encounter (HOSPITAL_COMMUNITY): Payer: Self-pay

## 2021-01-26 ENCOUNTER — Encounter (HOSPITAL_COMMUNITY)
Admission: RE | Admit: 2021-01-26 | Discharge: 2021-01-26 | Disposition: A | Discharge status: Home / Self Care | Payer: Medicare Other | Source: Ambulatory Visit | Attending: Urology | Admitting: Urology

## 2021-01-26 DIAGNOSIS — Z01818 Encounter for other preprocedural examination: Secondary | ICD-10-CM | POA: Diagnosis present

## 2021-01-26 DIAGNOSIS — C679 Malignant neoplasm of bladder, unspecified: Secondary | ICD-10-CM | POA: Diagnosis not present

## 2021-01-26 DIAGNOSIS — I5042 Chronic combined systolic (congestive) and diastolic (congestive) heart failure: Secondary | ICD-10-CM | POA: Diagnosis not present

## 2021-01-26 DIAGNOSIS — Z7984 Long term (current) use of oral hypoglycemic drugs: Secondary | ICD-10-CM | POA: Diagnosis not present

## 2021-01-26 DIAGNOSIS — I110 Hypertensive heart disease with heart failure: Secondary | ICD-10-CM | POA: Insufficient documentation

## 2021-01-26 DIAGNOSIS — E1151 Type 2 diabetes mellitus with diabetic peripheral angiopathy without gangrene: Secondary | ICD-10-CM | POA: Insufficient documentation

## 2021-01-26 DIAGNOSIS — Z951 Presence of aortocoronary bypass graft: Secondary | ICD-10-CM | POA: Diagnosis not present

## 2021-01-26 DIAGNOSIS — Z7982 Long term (current) use of aspirin: Secondary | ICD-10-CM | POA: Insufficient documentation

## 2021-01-26 DIAGNOSIS — Z87891 Personal history of nicotine dependence: Secondary | ICD-10-CM | POA: Diagnosis not present

## 2021-01-26 DIAGNOSIS — I2510 Atherosclerotic heart disease of native coronary artery without angina pectoris: Secondary | ICD-10-CM | POA: Diagnosis not present

## 2021-01-26 DIAGNOSIS — Z7902 Long term (current) use of antithrombotics/antiplatelets: Secondary | ICD-10-CM | POA: Diagnosis not present

## 2021-01-26 DIAGNOSIS — Z7901 Long term (current) use of anticoagulants: Secondary | ICD-10-CM | POA: Insufficient documentation

## 2021-01-26 DIAGNOSIS — Z79899 Other long term (current) drug therapy: Secondary | ICD-10-CM | POA: Diagnosis not present

## 2021-01-26 HISTORY — DX: Cardiac murmur, unspecified: R01.1

## 2021-01-26 HISTORY — DX: Personal history of urinary calculi: Z87.442

## 2021-01-26 LAB — CBC
HCT: 40.4 % (ref 39.0–52.0)
Hemoglobin: 13.5 g/dL (ref 13.0–17.0)
MCH: 30.8 pg (ref 26.0–34.0)
MCHC: 33.4 g/dL (ref 30.0–36.0)
MCV: 92.2 fL (ref 80.0–100.0)
Platelets: 288 10*3/uL (ref 150–400)
RBC: 4.38 MIL/uL (ref 4.22–5.81)
RDW: 13 % (ref 11.5–15.5)
WBC: 13 10*3/uL — ABNORMAL HIGH (ref 4.0–10.5)
nRBC: 0 % (ref 0.0–0.2)

## 2021-01-26 LAB — BASIC METABOLIC PANEL
Anion gap: 9 (ref 5–15)
BUN: 21 mg/dL (ref 8–23)
CO2: 24 mmol/L (ref 22–32)
Calcium: 8.9 mg/dL (ref 8.9–10.3)
Chloride: 103 mmol/L (ref 98–111)
Creatinine, Ser: 0.7 mg/dL (ref 0.61–1.24)
GFR, Estimated: >60 mL/min (ref >60)
Glucose, Bld: 144 mg/dL — ABNORMAL HIGH (ref 70–99)
Potassium: 4.5 mmol/L (ref 3.5–5.1)
Sodium: 136 mmol/L (ref 135–145)

## 2021-01-26 LAB — GLUCOSE, CAPILLARY: Glucose-Capillary: 138 mg/dL — ABNORMAL HIGH (ref 70–99)

## 2021-01-29 NOTE — Anesthesia Preprocedure Evaluation (Addendum)
Anesthesia Evaluation  Patient identified by MRN, date of birth, ID band Patient awake    Reviewed: Allergy & Precautions, NPO status , Patient's Chart, lab work & pertinent test results  Airway Mallampati: II  TM Distance: >3 FB Neck ROM: Full    Dental no notable dental hx.    Pulmonary neg pulmonary ROS, former smoker,    Pulmonary exam normal breath sounds clear to auscultation       Cardiovascular hypertension, + CAD, + Past MI, + Cardiac Stents and + CABG  + Valvular Problems/Murmurs MR  Rhythm:Regular Rate:Normal + Systolic murmurs    Neuro/Psych negative neurological ROS  negative psych ROS   GI/Hepatic negative GI ROS, Neg liver ROS,   Endo/Other  diabetes, Type 2  Renal/GU negative Renal ROS  negative genitourinary   Musculoskeletal negative musculoskeletal ROS (+)   Abdominal   Peds negative pediatric ROS (+)  Hematology negative hematology ROS (+)   Anesthesia Other Findings   Reproductive/Obstetrics negative OB ROS                            Anesthesia Physical Anesthesia Plan  ASA: 3  Anesthesia Plan: General   Post-op Pain Management:    Induction: Intravenous  PONV Risk Score and Plan: 2 and Ondansetron and Dexamethasone  Airway Management Planned: LMA  Additional Equipment:   Intra-op Plan:   Post-operative Plan: Extubation in OR  Informed Consent: I have reviewed the patients History and Physical, chart, labs and discussed the procedure including the risks, benefits and alternatives for the proposed anesthesia with the patient or authorized representative who has indicated his/her understanding and acceptance.     Dental advisory given  Plan Discussed with: CRNA and Surgeon  Anesthesia Plan Comments: (See PAT note 01/26/2021, Konrad Felix Ward, PA-C  ETT if relaxation needed)       Anesthesia Quick Evaluation

## 2021-01-29 NOTE — H&P (Signed)
CC/HPI: cc: bladder cancer   11/30/20: 65 year old man with intermittent painless gross hematuria since February. He also passes clots sporadically. He has a significant smoking history as been smoking at least a pack a day since he was a kid. He has a history of kidney stones from his early 43s. He does take Plavix. He reports in intermittently weak stream.   01/12/21: 65 year old man who initially presented with gross hematuria since February 2022 underwent TURBT with intravesical gemcitabine on 12/26/2020. He is here for follow-up today and Foley catheter removal. Pathology report shows high-grade invasive bladder cancer with no muscle present. 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 He feels better since having the surgery.     ALLERGIES: Morphine Sulfate    MEDICATIONS: Plavix 75 mg tablet  Glimepiride  Jardiance  Rosuvastatin Calcium     GU PSH: Cystoscopy - 11/30/2020 Cystoscopy TURBT 2-5 cm - 12/26/2020 Locm 300-399Mg /Ml Iodine,1Ml - 12/01/2020       PSH Notes: Bypass     NON-GU PSH: Cardiac Stent Placement     GU PMH: Bladder tumor/neoplasm - 12/01/2020, - 11/30/2020 Gross hematuria - 12/01/2020, - 11/30/2020    NON-GU PMH: Cardiac murmur, unspecified Diabetes Type 2 Heart disease, unspecified Hypertension Myocardial Infarction Other heart failure    FAMILY HISTORY: 1 Daughter - Runs in Family 1 son - Runs in Family Kidney Failure - Runs in Family   SOCIAL HISTORY: Marital Status: Unknown Current Smoking Status: Patient does not smoke anymore. Has not smoked since 11/14/2010.   Tobacco Use Assessment Completed: Used Tobacco in last 30 days? Does drink.  Drinks 4+ caffeinated drinks per day.    REVIEW OF SYSTEMS:    GU Review Male:   Patient denies frequent urination, hard to postpone urination, burning/ pain with urination, get up at night to urinate, leakage of urine, stream starts and stops, trouble starting your stream, have to strain to urinate , erection problems, and  penile pain.  Gastrointestinal (Upper):   Patient denies nausea, vomiting, and indigestion/ heartburn.  Gastrointestinal (Lower):   Patient denies diarrhea and constipation.  Constitutional:   Patient denies fever, night sweats, weight loss, and fatigue.  Skin:   Patient denies itching and skin rash/ lesion.  Eyes:   Patient denies blurred vision and double vision.  Ears/ Nose/ Throat:   Patient denies sore throat and sinus problems.  Hematologic/Lymphatic:   Patient denies swollen glands and easy bruising.  Cardiovascular:   Patient denies leg swelling and chest pains.  Respiratory:   Patient denies cough and shortness of breath.  Endocrine:   Patient denies excessive thirst.  Musculoskeletal:   Patient denies back pain and joint pain.  Neurological:   Patient denies headaches and dizziness.  Psychologic:   Patient denies depression and anxiety.   VITAL SIGNS: None   MULTI-SYSTEM PHYSICAL EXAMINATION:    Constitutional: Well-nourished. No physical deformities. Normally developed. Good grooming.  Neck: Neck symmetrical, not swollen. Normal tracheal position.  Respiratory: No labored breathing, no use of accessory muscles.   Skin: No paleness, no jaundice, no cyanosis. No lesion, no ulcer, no rash.  Neurologic / Psychiatric: Oriented to time, oriented to place, oriented to person. No depression, no anxiety, no agitation.  Gastrointestinal: No rigidity, non obese abdomen.   Eyes: Normal conjunctivae. Normal eyelids.  Ears, Nose, Mouth, and Throat: Left ear no scars, no lesions, no masses. Right ear no scars, no lesions, no masses. Nose no scars, no lesions, no masses. Normal hearing. Normal lips.  Musculoskeletal: Normal gait and  station of head and neck.     Complexity of Data:  Lab Test Review:   Path Report  Records Review:   Previous Patient Records, POC Tool  Urine Test Review:   Urinalysis  Notes:                     12/25/20: BUN 23, Cr 0.69   PROCEDURES:         Voiding Trial  - 51700  Instilled Volume: 180 cc   ASSESSMENT:      ICD-10 Details  1 GU:   Bladder Cancer overlapping sites - C67.8 Undiagnosed New Problem   PLAN:           Document Letter(s):  Created for Patient: Clinical Summary         Notes:   1. Bladder cancer: Patient with new high-grade invasive bladder cancer diagnosis. Tumor invades lamina propria but not muscle not present in the specimen. We discussed next steps are restaging TURBT. We reviewed staging as well as management for T1 vs T2. Will go ahead and remove the Foley catheter today. RIsks and benefits of restaging TURBT again discussed with patient including but not limited to infection, bleeding, pain, damage to surrounding structures including bladder perforation, need for Foley catheter, need for additional treatment. Patient be scheduled for surgery in proximally 2-3 weeks.

## 2021-01-29 NOTE — Progress Notes (Signed)
Anesthesia Chart Review   Case: [id number] Date/Time: 01/30/21 0900   Procedure: RESTAGING TRANSURETHRAL RESECTION OF BLADDER TUMOR (TURBT) - 1 HR   Anesthesia type: General   Pre-op diagnosis: BLADDER CANCER   Location: [facility]   Surgeons: Robley Fries, MD       DISCUSSION:65 y.o. former smoker with h/o CHF (Echo 35-40%), CAD (CABG x 3 2011, stent 2013), s/p MVR 2011, DM II (A1C 7.5), HTN, bladder cancer scheduled for above procedure 01/30/2021.    Per cardiology preoperative evaluation 12/04/20, "Chart reviewed as part of pre-operative protocol coverage. Patient was contacted 12/04/2020 in reference to pre-operative risk assessment for pending surgery as outlined below.  Eric Bradshaw was last seen on 04/2020 by Dr. Ellyn Hack with complex CV history as outlined with multivessel CAD s/p CABG, PCI, ischemic cardiomyopathy, CHF, HTN, HLD, MV repair at time of CABG, DM amongst other medical history as outlined. Last echo 2015 EF 35-40%. RCRI 6.6% indicating moderate CV risk of complications. I reached out to patient for update on how he is doing. The patient affirms he has been doing well without any new cardiac symptoms. He walks regularly at work throughout the day for hours over a 2 sq mile property without angina or dyspnea. Therefore, based on ACC/AHA guidelines, the patient would be at acceptable risk for the planned procedure without further cardiovascular testing. The patient was advised that if he develops new symptoms prior to surgery to contact our office to arrange for a follow-up visit, and he verbalized understanding.   Per Dr. Allison Quarry last note, Okay to hold both aspirin and Plavix for any procedures or surgeries 5-7 days preop."  S/p TURBT 12/26/2020 with no anesthesia complications noted.   Anticipate pt can proceed with planned procedure barring acute status change.   VS: BP 130/78   Pulse 77   Temp 36.7 C (Oral)   Resp 16   Ht 5\' 8"  (1.727 m)   Wt 76.2  kg   SpO2 98%   BMI 25.54 kg/m   PROVIDERS: Jenel Lucks, PA-C is PCP   Glenetta Hew, MD is Cardiologist  LABS: Labs reviewed: Acceptable for surgery. (all labs ordered are listed, but only abnormal results are displayed)  Labs Reviewed  BASIC METABOLIC PANEL - Abnormal; Notable for the following components:      Result Value   Glucose, Bld 144 (*)    All other components within normal limits  CBC - Abnormal; Notable for the following components:   WBC 13.0 (*)    All other components within normal limits  GLUCOSE, CAPILLARY - Abnormal; Notable for the following components:   Glucose-Capillary 138 (*)    All other components within normal limits     IMAGES:   EKG: 04/18/2020 Rate 81 bpm  NSR Left ventricular hypertrophy with repolarization abnormality Cannot rule out septal infarct, age undetermined  CV: Echo 08/25/2013 - Left ventricle: Distal septal apical , mid and basal    inferior akinesis The cavity size was moderately dilated.    Wall thickness was normal. Systolic function was    moderately reduced. The estimated ejection fraction was in    the range of 35% to 40%.  - Mitral valve: S/P mitral valve ring? significant diastolic    gradient and mild residual MR Mild regurgitation. Valve    area by continuity equation (using LVOT flow): 1.41cm^2.  - Left atrium: The atrium was moderately dilated.  - Atrial septum: No defect or patent foramen  ovale was    identified. Past Medical History:  Diagnosis Date   CAD S/P percutaneous coronary angioplasty 03/2010 -- 3-07/2011   Proximal LAD ~80% (into D2) & mid - subtotal occlusion , RCA occlusion, LCx occlusion -- s/p CABG x 3  (LIMA-LAD, SVG-OM, SVG-RPDA); Cath 06/2011, patent grafts with severe proximal  LAD - D1 lesions -- 07/2011: PCI  proxLAD-into D2 with Promus Premier DES 2.5 mm x 38 mm (distal in D2 2.5 mm, @ bifurcation - 2.75 m, in prox LAD ~3.0 mm   Carotid artery disease (HCC)    moderate by  2017 duplex US   Chronic combined systolic and diastolic HF (heart failure), NYHA class 2 ([place]) 03/2010; 08/2103   a) EF ~30-30%; Exertional Dyspnea; No exaccerbations.;; b)EF 35-40%, Mod LA dilation   DM (diabetes mellitus) type II controlled peripheral vascular disorder 04/16/2011   & CAD   Heart murmur    History of kidney stones    HTN, goal below 130/80 03/15/2010   Hyperlipidemia LDL goal < 70    Ischemic cardiomyopathy 03/2010; May 2015   a) Echo - EF 30-40%; mild/mod anterior wall hypokinesis; doppler flow suggestive of impaired LV relaxation; LA moderately dilated; annuloplasty ring noted in mitral position; mild/mod mitral regurgitation; RVsystolic pressure elevated at 30-39mmHg;;; b) Echo May 2015: EF 35-40%   NSTEMI (non-ST elevated myocardial infarction) ([place]) 29/93/7169   Complicated by Severe Acute Systolic CHF -- Class IV CHF,   S/P CABG x 3 03/15/2010   LIMA-mLAD, SVG-OM, SVG-rPDA   S/P MVR (mitral valve repair) 03/15/2010   @ time of CABG for ischemic MR - 26 mm Edwards physio-2 annuloplasty ring   Tobacco dependence    Initially quit - restarted ~mid 2012; now essentially quit.    Past Surgical History:  Procedure Laterality Date   CORONARY ARTERY BYPASS GRAFT  03/2010   LIMA-LAD, SVG-OM, SVG-RPDA   Left Arm Surgery - NOS     LEFT HEART CATHETERIZATION WITH CORONARY/GRAFT ANGIOGRAM  07/03/2011   Procedure: LEFT HEART CATHETERIZATION WITH Beatrix Fetters;  Surgeon: Leonie Man, MD;  Location: [facility] CATH LAB;  Service: Cardiovascular;Patent LIMA-LAD, SVG-OM, SVG-RPDA; (native RCA & OM prox 100%, prox LAD into major D2 ~80% long lesion, midLAD 100%   MITRAL VALVULOPLASTY  03/2010   conjuntive with CABG for Severe MR; 26 mm Edwards physio-2 annuloplasty ring   NM MYOVIEW LTD  04/24/2011   R/L MV - EF 37%; mild perfusion defect due to attenuation w/mild to mod superimposed ischemia seen in apex, apical lateral and distal to mid ateroseptal region; LV systolic fcn  moderately reduced; hypotensive BP response to stress w/o chest pain; high risk scan   PERCUTANEOUS CORONARY STENT INTERVENTION (PCI-S) N/A 08/02/2011   Procedure: PERCUTANEOUS CORONARY STENT INTERVENTION (PCI-S);  Surgeon: Leonie Man, MD;  Location: [facility] CATH LAB;  Service: Cardiovascular;  PCI proximal LAD-D2: Promus Premier DES 2.5 x 38 (tapered post-dilation from 3.0 mm in proximal LAD, 2.75 mm at bifurcation and 2.5 mm in D2)   TONSILLECTOMY     Childhood   TRANSTHORACIC ECHOCARDIOGRAM  03/23/2010    Pre-CABG/MVR: EF 25-30%; global HK; Mod-Severe MR   TRANSTHORACIC ECHOCARDIOGRAM  09/2011; 08/2013   a) EF up to 30-40% (post PCI); mild-mod Ant wall HK; Gd 1 DD; MV Annuloplasty ring in place, mild-mod MR; mild PHTN (30-40 mmHg);; b)  EF 35-40% with moderately reduced function. Moderately dilated LV with distal septal-apical, mid and basal inferior akinesis; status post mitral valve ring. Significant diastolic gradient with  mild MR. Moderate LA dilation.     TRANSURETHRAL RESECTION OF BLADDER TUMOR WITH MITOMYCIN-C N/A 12/26/2020   Procedure: TRANSURETHRAL RESECTION OF BLADDER TUMOR WITH GEMCITABINE;  Surgeon: Robley Fries, MD;  Location: [facility];  Service: Urology;  Laterality: N/A;    MEDICATIONS:  Ascorbic Acid (VITAMIN C) 1000 MG tablet   aspirin EC 81 MG tablet   B Complex-C (SUPER B COMPLEX PO)   carvedilol (COREG) 12.5 MG tablet   Cholecalciferol (VITAMIN D-3) 125 MCG (5000 UT) TABS   clopidogrel (PLAVIX) 75 MG tablet   Echinacea 786 MG CAPS   Garlic (GARLIQUE PO)   glimepiride (AMARYL) 4 MG tablet   ibuprofen (ADVIL) 200 MG tablet   JARDIANCE 25 MG TABS tablet   Magnesium 500 MG TABS   Multiple Vitamins-Minerals (MULTIVITAMINS THER. W/MINERALS) TABS   Omega-3 Fatty Acids (FISH OIL) 1000 MG CAPS   rosuvastatin (CRESTOR) 40 MG tablet   traMADol (ULTRAM) 50 MG tablet   valsartan (DIOVAN) 80 MG tablet   vitamin E 1000 UNIT capsule   Zinc 50 MG TABS   No current  facility-administered medications for this encounter.    Konrad Felix Ward, PA-C [facility] [phone]

## 2021-01-30 ENCOUNTER — Ambulatory Visit (HOSPITAL_COMMUNITY): Payer: Medicare Other | Admitting: Physician Assistant

## 2021-01-30 ENCOUNTER — Encounter (HOSPITAL_COMMUNITY)
Admission: RE | Disposition: A | Discharge status: Home / Self Care | Payer: Self-pay | Source: Ambulatory Visit | Attending: Urology

## 2021-01-30 ENCOUNTER — Ambulatory Visit (HOSPITAL_COMMUNITY)
Admission: RE | Admit: 2021-01-30 | Discharge: 2021-01-30 | Disposition: A | Discharge status: Home / Self Care | Payer: Medicare Other | Source: Ambulatory Visit | Attending: Urology | Admitting: Urology

## 2021-01-30 ENCOUNTER — Encounter (HOSPITAL_COMMUNITY): Payer: Self-pay | Admitting: Urology

## 2021-01-30 ENCOUNTER — Ambulatory Visit (HOSPITAL_COMMUNITY): Payer: Medicare Other | Admitting: Anesthesiology

## 2021-01-30 DIAGNOSIS — Z87891 Personal history of nicotine dependence: Secondary | ICD-10-CM | POA: Diagnosis not present

## 2021-01-30 DIAGNOSIS — R3912 Poor urinary stream: Secondary | ICD-10-CM | POA: Insufficient documentation

## 2021-01-30 DIAGNOSIS — Z7902 Long term (current) use of antithrombotics/antiplatelets: Secondary | ICD-10-CM | POA: Insufficient documentation

## 2021-01-30 DIAGNOSIS — Z87442 Personal history of urinary calculi: Secondary | ICD-10-CM | POA: Insufficient documentation

## 2021-01-30 DIAGNOSIS — C674 Malignant neoplasm of posterior wall of bladder: Secondary | ICD-10-CM | POA: Insufficient documentation

## 2021-01-30 HISTORY — PX: TRANSURETHRAL RESECTION OF BLADDER TUMOR: SHX2575

## 2021-01-30 LAB — GLUCOSE, CAPILLARY
Glucose-Capillary: 195 mg/dL — ABNORMAL HIGH (ref 70–99)
Glucose-Capillary: 186 mg/dL — ABNORMAL HIGH (ref 70–99)

## 2021-01-30 SURGERY — TURBT (TRANSURETHRAL RESECTION OF BLADDER TUMOR)
Anesthesia: General

## 2021-01-30 MED ORDER — CEPHALEXIN 500 MG PO CAPS: 500.0000 mg | ORAL_CAPSULE | Freq: Two times a day (BID) | ORAL | 0 refills | Status: AC

## 2021-01-30 MED ORDER — ONDANSETRON HCL 4 MG/2ML IJ SOLN
INTRAMUSCULAR | Status: AC
  Filled 2021-01-30: qty 2

## 2021-01-30 MED ORDER — LIDOCAINE 2% (20 MG/ML) 5 ML SYRINGE
INTRAMUSCULAR | Status: DC | PRN
  Administered 2021-01-30: 100 mg via INTRAVENOUS

## 2021-01-30 MED ORDER — ONDANSETRON HCL 4 MG/2ML IJ SOLN: 4.0000 mg | Freq: Once | INTRAMUSCULAR | Status: DC | PRN

## 2021-01-30 MED ORDER — CHLORHEXIDINE GLUCONATE 0.12 % MT SOLN: 15.0000 mL | Freq: Once | OROMUCOSAL | Status: AC

## 2021-01-30 MED ORDER — DEXAMETHASONE SODIUM PHOSPHATE 10 MG/ML IJ SOLN
INTRAMUSCULAR | Status: AC
  Filled 2021-01-30: qty 1

## 2021-01-30 MED ORDER — LACTATED RINGERS IV SOLN: INTRAVENOUS | Status: DC

## 2021-01-30 MED ORDER — DEXAMETHASONE SODIUM PHOSPHATE 10 MG/ML IJ SOLN
INTRAMUSCULAR | Status: DC | PRN
  Administered 2021-01-30: 10 mg via INTRAVENOUS

## 2021-01-30 MED ORDER — FENTANYL CITRATE (PF) 100 MCG/2ML IJ SOLN
INTRAMUSCULAR | Status: AC
  Filled 2021-01-30: qty 2

## 2021-01-30 MED ORDER — HYDROMORPHONE HCL 1 MG/ML IJ SOLN
INTRAMUSCULAR | Status: AC
  Filled 2021-01-30: qty 1

## 2021-01-30 MED ORDER — SODIUM CHLORIDE 0.9 % IR SOLN
Status: DC | PRN
  Administered 2021-01-30: 12000 mL

## 2021-01-30 MED ORDER — ACETAMINOPHEN 10 MG/ML IV SOLN: 1000.0000 mg | Freq: Once | INTRAVENOUS | Status: DC | PRN

## 2021-01-30 MED ORDER — PROPOFOL 10 MG/ML IV BOLUS
INTRAVENOUS | Status: DC | PRN
  Administered 2021-01-30: 70 mg via INTRAVENOUS
  Administered 2021-01-30: 130 mg via INTRAVENOUS

## 2021-01-30 MED ORDER — PHENYLEPHRINE 40 MCG/ML (10ML) SYRINGE FOR IV PUSH (FOR BLOOD PRESSURE SUPPORT)
PREFILLED_SYRINGE | INTRAVENOUS | Status: AC
  Filled 2021-01-30: qty 10

## 2021-01-30 MED ORDER — HYDROMORPHONE HCL 1 MG/ML IJ SOLN
0.2500 mg | INTRAMUSCULAR | Status: DC | PRN
  Administered 2021-01-30 (×2): 0.25 mg via INTRAVENOUS

## 2021-01-30 MED ORDER — MIDAZOLAM HCL 5 MG/5ML IJ SOLN
INTRAMUSCULAR | Status: DC | PRN
  Administered 2021-01-30: 2 mg via INTRAVENOUS

## 2021-01-30 MED ORDER — ONDANSETRON HCL 4 MG/2ML IJ SOLN
INTRAMUSCULAR | Status: DC | PRN
  Administered 2021-01-30: 4 mg via INTRAVENOUS

## 2021-01-30 MED ORDER — LIDOCAINE HCL (PF) 2 % IJ SOLN
INTRAMUSCULAR | Status: AC
  Filled 2021-01-30: qty 5

## 2021-01-30 MED ORDER — PROPOFOL 10 MG/ML IV BOLUS
INTRAVENOUS | Status: AC
  Filled 2021-01-30: qty 20

## 2021-01-30 MED ORDER — FENTANYL CITRATE (PF) 100 MCG/2ML IJ SOLN
INTRAMUSCULAR | Status: DC | PRN
  Administered 2021-01-30 (×5): 25 ug via INTRAVENOUS
  Administered 2021-01-30: 50 ug via INTRAVENOUS
  Administered 2021-01-30: 25 ug via INTRAVENOUS

## 2021-01-30 MED ORDER — TRAMADOL HCL 50 MG PO TABS
50.0000 mg | ORAL_TABLET | Freq: Four times a day (QID) | ORAL | 0 refills | Status: DC | PRN
Start: 2021-01-30 — End: 2021-04-27

## 2021-01-30 MED ORDER — MIDAZOLAM HCL 2 MG/2ML IJ SOLN
INTRAMUSCULAR | Status: AC
  Filled 2021-01-30: qty 2

## 2021-01-30 MED ORDER — CEFAZOLIN SODIUM-DEXTROSE 2-4 GM/100ML-% IV SOLN
2.0000 g | INTRAVENOUS | Status: AC
  Administered 2021-01-30: 2 g via INTRAVENOUS
  Filled 2021-01-30: qty 100

## 2021-01-30 MED ORDER — PHENYLEPHRINE 40 MCG/ML (10ML) SYRINGE FOR IV PUSH (FOR BLOOD PRESSURE SUPPORT)
PREFILLED_SYRINGE | INTRAVENOUS | Status: DC | PRN
  Administered 2021-01-30 (×2): 80 ug via INTRAVENOUS

## 2021-01-30 MED ORDER — EPHEDRINE 5 MG/ML INJ
INTRAVENOUS | Status: AC
  Filled 2021-01-30: qty 5

## 2021-01-30 MED ORDER — 0.9 % SODIUM CHLORIDE (POUR BTL) OPTIME
TOPICAL | Status: DC | PRN
  Administered 2021-01-30: 1000 mL

## 2021-01-30 MED ORDER — ORAL CARE MOUTH RINSE
15.0000 mL | Freq: Once | OROMUCOSAL | Status: AC
  Administered 2021-01-30: 15 mL via OROMUCOSAL

## 2021-01-30 SURGICAL SUPPLY — 18 items
BAG DRN RND TRDRP ANRFLXCHMBR (UROLOGICAL SUPPLIES)
BAG URINE DRAIN 2000ML AR STRL (UROLOGICAL SUPPLIES) IMPLANT
BAG URINE LEG 500ML (DRAIN) ×1 IMPLANT
BAG URO CATCHER STRL LF (MISCELLANEOUS) ×2 IMPLANT
CATH FOLEY 2WAY 5CC 20FR (CATHETERS) ×1 IMPLANT
CLOTH BEACON ORANGE TIMEOUT ST (SAFETY) ×2 IMPLANT
DRAPE FOOT SWITCH (DRAPES) ×2 IMPLANT
ELECT REM PT RETURN 15FT ADLT (MISCELLANEOUS) ×1 IMPLANT
GLOVE SURG ENC MOIS LTX SZ6.5 (GLOVE) ×3 IMPLANT
GOWN STRL REUS W/TWL LRG LVL3 (GOWN DISPOSABLE) ×4 IMPLANT
LOOP CUT BIPOLAR 24F LRG (ELECTROSURGICAL) ×1 IMPLANT
MANIFOLD NEPTUNE II (INSTRUMENTS) ×2 IMPLANT
PACK CYSTO (CUSTOM PROCEDURE TRAY) ×2 IMPLANT
SYR TOOMEY IRRIG 70ML (MISCELLANEOUS) ×2
SYRINGE TOOMEY IRRIG 70ML (MISCELLANEOUS) IMPLANT
TUBING CONNECTING 10 (TUBING) ×2 IMPLANT
TUBING UROLOGY SET (TUBING) ×2 IMPLANT
WATER STERILE IRR 500ML POUR (IV SOLUTION) ×1 IMPLANT

## 2021-01-30 NOTE — Anesthesia Postprocedure Evaluation (Signed)
Anesthesia Post Note  Patient: Eric Bradshaw  Procedure(s) Performed: RESTAGING TRANSURETHRAL RESECTION OF BLADDER TUMOR (TURBT)     Patient location during evaluation: PACU Anesthesia Type: General Level of consciousness: awake and alert Pain management: pain level controlled Vital Signs Assessment: post-procedure vital signs reviewed and stable Respiratory status: spontaneous breathing, nonlabored ventilation, respiratory function stable and patient connected to nasal cannula oxygen Cardiovascular status: blood pressure returned to baseline and stable Postop Assessment: no apparent nausea or vomiting Anesthetic complications: no   No notable events documented.  Last Vitals:  Vitals:   01/30/21 1145 01/30/21 1200  BP: 133/77 140/81  Pulse: 94 (!) 101  Resp: 18 16  Temp:  36.7 C  SpO2: 92% 93%    Last Pain:  Vitals:   01/30/21 1200  TempSrc:   PainSc: 7                  Cordelia Bessinger S

## 2021-01-30 NOTE — Anesthesia Procedure Notes (Signed)
Procedure Name: LMA Insertion Date/Time: 01/30/2021 9:06 AM Performed by: Sharlette Dense, CRNA Patient Re-evaluated:Patient Re-evaluated prior to induction Oxygen Delivery Method: Circle system utilized Preoxygenation: Pre-oxygenation with 100% oxygen Induction Type: IV induction LMA: LMA inserted LMA Size: 4.0 Number of attempts: 1 Placement Confirmation: positive ETCO2 and breath sounds checked- equal and bilateral Tube secured with: Tape Dental Injury: Teeth and Oropharynx as per pre-operative assessment

## 2021-01-30 NOTE — Op Note (Signed)
PATIENT:  Eric Bradshaw  PRE-OPERATIVE DIAGNOSIS: Bladder tumor  POST-OPERATIVE DIAGNOSIS: Same  PROCEDURE:  Procedure(s): 1. Restaging TRANSURETHRAL RESECTION OF BLADDER TUMOR (TURBT) (5cm.)   SURGEON:  Jacalyn Lefevre, MD  ANESTHESIA:   General  EBL:  Minimal  DRAINS: Urethral catheter (20 Fr. Foley)   SPECIMEN:   Necrotic bladder tumor Posterior wall bladder tumor  DISPOSITION OF SPECIMEN:  PATHOLOGY  Indication: 65 year old man found to have bladder tumor that was previously resected with pathology showing high-grade T1.  He is here for restaging TURBT  Findings: Normal anterior urethra Lateral lobe prostatic hypertrophy Orthotopic bilateral ureteral orifices uninvolved by bladder tumor Poorly healing necrotic bladder mucosa/bladder tumor on posterior superior bladder wall approximately 5 cm in diameter Repeat resection was done of residual bladder tumor and fulguration over the entire tumor bed was repeated Hemostasis adequate at end of case with the irrigant turned off 20 French coud Foley catheter  Description of operation: The patient was taken to the operating room and administered general anesthesia. They were then placed on the table and moved to the dorsal lithotomy position after which the genitalia was sterilely prepped and draped. An official timeout was then performed.  The 80 French resectoscope with the 30 lens and visual obturator were then passed into the bladder under direct visualization. Urethra appeared normal. The visual obturator was then removed and the Gyrus resectoscope element with 30  lens was then inserted and the bladder was fully and systematically inspected. Ureteral orifices were noted to be in the normal anatomic positions.   The previously resected bladder tumor bed was noted to be necrotic with sloughing tissue.  This was gently removed with the bipolar electrocautery loop.  After visualization improved there is noted to be a nodular  component left on the posterior bladder wall.  This was then resected with bipolar loop.  The entire bladder tumor bed was then cauterized with the bipolar electrocautery loop.  Reinspection of the bladder revealed all obvious tumor had been fully resected and there was no evidence of perforation. The Microvasive evacuator was then used to irrigate the bladder and remove all of the portions of bladder tumor which were sent to pathology. I then removed the resectoscope.  A 20 French Foley catheter was then inserted in the bladder and irrigated. The irrigant returned slightly pink with no clots. The patient was awakened and taken to the recovery room.  Patient be discharged home with a Foley catheter in place to allow extensive bladder tumor bed to heal.  PLAN OF CARE: Discharge to home after PACU  PATIENT DISPOSITION:  PACU - hemodynamically stable.

## 2021-01-30 NOTE — Interval H&P Note (Signed)
History and Physical Interval Note:  01/30/2021 8:34 AM  Eric Bradshaw  has presented today for surgery, with the diagnosis of BLADDER CANCER.  The various methods of treatment have been discussed with the patient and family. After consideration of risks, benefits and other options for treatment, the patient has consented to  Procedure(s) with comments: RESTAGING TRANSURETHRAL RESECTION OF BLADDER TUMOR (TURBT) (N/A) - 1 HR as a surgical intervention.  The patient's history has been reviewed, patient examined, no change in status, stable for surgery.  I have reviewed the patient's chart and labs.  Questions were answered to the patient's satisfaction.     Derren Suydam D Hyun Reali

## 2021-01-30 NOTE — Transfer of Care (Signed)
Immediate Anesthesia Transfer of Care Note  Patient: Eric Bradshaw  Procedure(s) Performed: RESTAGING TRANSURETHRAL RESECTION OF BLADDER TUMOR (TURBT)  Patient Location: PACU  Anesthesia Type:General  Level of Consciousness: drowsy  Airway & Oxygen Therapy: Patient Spontanous Breathing and Patient connected to face mask oxygen  Post-op Assessment: Report given to RN and Post -op Vital signs reviewed and stable  Post vital signs: Reviewed and stable  Last Vitals:  Vitals Value Taken Time  BP 164/91 01/30/21 0957  Temp    Pulse 102 01/30/21 0959  Resp 19 01/30/21 0959  SpO2 96 % 01/30/21 0959  Vitals shown include unvalidated device data.  Last Pain:  Vitals:   01/30/21 0700  TempSrc: Oral         Complications: No notable events documented.

## 2021-01-30 NOTE — Discharge Instructions (Signed)

## 2021-01-31 ENCOUNTER — Encounter (HOSPITAL_COMMUNITY): Payer: Self-pay | Admitting: Urology

## 2021-01-31 LAB — SURGICAL PATHOLOGY

## 2021-02-12 ENCOUNTER — Telehealth: Payer: Self-pay | Admitting: Cardiology

## 2021-02-12 ENCOUNTER — Other Ambulatory Visit: Payer: Self-pay | Admitting: Urology

## 2021-02-12 NOTE — Telephone Encounter (Signed)
Office called back to add aspirin EC 81 MG tablet for the clearance. Please advise

## 2021-02-12 NOTE — Telephone Encounter (Signed)
     [facility] Pre-operative Risk Assessment    Patient Name: AKAASH VANDEWATER  DOB: Nov 05, 1955 MRN: [id number]  HEARTCARE STAFF:  - IMPORTANT!!!!!! Under Visit Info/Reason for Call, type in Other and utilize the format Clearance MM/DD/YY or Clearance TBD. Do not use dashes or single digits. - Please review there is not already an duplicate clearance open for this procedure. - If request is for dental extraction, please clarify the # of teeth to be extracted. - If the patient is currently at the dentist's office, call Pre-Op Callback Staff (MA/nurse) to input urgent request.  - If the patient is not currently in the dentist office, please route to the Pre-Op pool.  Request for surgical clearance:  What type of surgery is being performed? Restaging transurethral resection of bladder tumor  When is this surgery scheduled? 02/20/21  What type of clearance is required (medical clearance vs. Pharmacy clearance to hold med vs. Both)? both  Are there any medications that need to be held prior to surgery and how long? Plavix needs to be held 5 days prior  Practice name and name of physician performing surgery? Dr Kathee Delton, [facility]  What is the office phone number? [place]   7.   What is the office fax number? [phone]  8.   Anesthesia type (None, local, MAC, general) ? general   Gwendel Hanson 02/12/2021, 2:26 PM  _________________________________________________________________   (provider comments below)

## 2021-02-12 NOTE — Telephone Encounter (Signed)
   Name: Eric Bradshaw  DOB: Jan 13, 1956  MRN: [id number]   Primary Cardiologist: Glenetta Hew, MD  Chart reviewed as part of pre-operative protocol coverage. Patient was contacted 02/12/2021 in reference to pre-operative risk assessment for pending surgery as outlined below.  Eric Bradshaw was last seen on 04/2020 by Dr. Ellyn Hack with complex CV history as outlined with multivessel CAD s/p CABG, PCI, ischemic cardiomyopathy, CHF, HTN, HLD, MV repair at time of CABG, DM amongst other medical history as outlined. Last echo 2015 EF 35-40%. RCRI 6.6% indicating moderate CV risk of complications.   I left a voicemail for patient to call back for ongoing preop evaluation.  Per Dr. Allison Quarry last note, Okay to hold both aspirin and Plavix for any procedures or surgeries 5-7 days preop.   Abigail Butts, PA-C 02/12/2021, 3:30 PM

## 2021-02-12 NOTE — Telephone Encounter (Signed)
   Name: AVANISH CERULLO  DOB: 01-08-1956  MRN: [id number]   Primary Cardiologist: Glenetta Hew, MD  Chart reviewed as part of pre-operative protocol coverage. Patient was contacted 02/12/2021 in reference to pre-operative risk assessment for pending surgery as outlined below.  JOVAUN LEVENE was last seen on 04/2020 by Dr. Ellyn Hack with complex CV history as outlined with multivessel CAD s/p CABG, PCI, ischemic cardiomyopathy, CHF, HTN, HLD, MV repair at time of CABG, DM amongst other medical history as outlined. Last echo 2015 EF 35-40%. RCRI 6.6% indicating moderate CV risk of complications. Since that day, LAYNE DILAURO has done fine from a cardiac standpoint.  He can easily complete 4 METS without anginal complaints.  Therefore, based on ACC/AHA guidelines, the patient would be at acceptable risk for the planned procedure without further cardiovascular testing.   The patient was advised that if he develops new symptoms prior to surgery to contact our office to arrange for a follow-up visit, and he verbalized understanding.  Per Dr. Allison Quarry last note, Okay to hold both aspirin and Plavix for any procedures or surgeries 5-7 days preop.  I will route this recommendation to the requesting party via Epic fax function and remove from pre-op pool. Please call with questions.  Abigail Butts, PA-C 02/12/2021, 4:31 PM

## 2021-02-13 NOTE — Progress Notes (Signed)
For Short Stay: [place] appointment date: Date of COVID positive in last 69 days:   For Anesthesia: PCP -  Cardiologist - Abigail Butts, PA-C 02/12/2021 cardiac clearance note in epic  Chest x-ray -  EKG -  Stress Test -  ECHO -  Cardiac Cath -  Pacemaker/ICD device last checked:  Sleep Study -  CPAP -   Fasting Blood Sugar -  Checks Blood Sugar _____ times a day  Blood Thinner Instructions: Aspirin Instructions: Last Dose:  Activity level: Can go up a flight of stairs and activities of daily living without stopping and without chest pain and/or shortness of breath   Able to exercise without chest pain and/or shortness of breath   Unable to go up a flight of stairs without chest pain and/or shortness of breath     Anesthesia review:   Patient denies shortness of breath, fever, cough and chest pain at PAT appointment   Patient verbalized understanding of instructions that were given to them at the PAT appointment. Patient was also instructed that they will need to review over the PAT instructions again at home before surgery.

## 2021-02-14 ENCOUNTER — Other Ambulatory Visit: Payer: Self-pay

## 2021-02-14 ENCOUNTER — Encounter (HOSPITAL_COMMUNITY): Payer: Self-pay | Admitting: Urology

## 2021-02-14 NOTE — Progress Notes (Signed)
Anesthesia Review:  PCP: Cathi Roan  Cardiologist : DR Glenetta Hew  Gwenith Daily Kroeger on 02/12/21  Chest x-ray : EKG : 04/18/20 Echo : Stress test: Cardiac Cath :  Activity level:  can do a flight of stairs without difficulty  Sleep Study/ CPAP : none  Fasting Blood Sugar :      / Checks Blood Sugar -- times a day:   Blood Thinner/ Instructions /Last Dose: ASA / Instructions/ Last Dose :   DM- type 2 checks glucose 2-3 times per week per pt  Hgba1c- 01/15/21- 7.5  Plavix- last dose 02/14/2021 per pt

## 2021-02-14 NOTE — Progress Notes (Signed)
Anesthesia Chart Review   Case: [id number] Anesthesia Start Date/Time: 01/30/21 0856   Procedure: RESTAGING TRANSURETHRAL RESECTION OF BLADDER TUMOR (TURBT) - 1 HR   Anesthesia type: General   Pre-op diagnosis: BLADDER CANCER   Location: [place] [facility]   Surgeons: Robley Fries, MD       DISCUSSION:65 y.o. former smoker with h/o CHF (Echo 35-40%), CAD (CABG x 3 2011, stent 2013), s/p MVR 2011, DM II (A1C 7.5), HTN, bladder cancer scheduled for above procedure 01/30/2021.    Per cardiology note 02/12/2021, "Chart reviewed as part of pre-operative protocol coverage. Patient was contacted 02/12/2021 in reference to pre-operative risk assessment for pending surgery as outlined below.  Eric Bradshaw was last seen on 04/2020 by Dr. Ellyn Hack with complex CV history as outlined with multivessel CAD s/p CABG, PCI, ischemic cardiomyopathy, CHF, HTN, HLD, MV repair at time of CABG, DM amongst other medical history as outlined. Last echo 2015 EF 35-40%. RCRI 6.6% indicating moderate CV risk of complications. Since that day, Eric Bradshaw has done fine from a cardiac standpoint.  He can easily complete 4 METS without anginal complaints.   Therefore, based on ACC/AHA guidelines, the patient would be at acceptable risk for the planned procedure without further cardiovascular testing.    The patient was advised that if he develops new symptoms prior to surgery to contact our office to arrange for a follow-up visit, and he verbalized understanding.   Per Dr. Allison Quarry last note, Okay to hold both aspirin and Plavix for any procedures or surgeries 5-7 days preop."  Anticipate pt can proceed with planned procedure barring acute status change.   VS: BP 130/78   Pulse 77   Temp 36.7 C (Oral)   Resp 16   Ht 5\' 8"  (1.727 m)   Wt 76.2 kg   SpO2 98%   BMI 25.54 kg/m   PROVIDERS: Jenel Lucks, PA-C is PCP    Glenetta Hew, MD is Cardiologist   LABS: Labs reviewed:  Acceptable for surgery. (all labs ordered are listed, but only abnormal results are displayed)  Labs Reviewed  BASIC METABOLIC PANEL - Abnormal; Notable for the following components:      Result Value   Glucose, Bld 144 (*)    All other components within normal limits  CBC - Abnormal; Notable for the following components:   WBC 13.0 (*)    All other components within normal limits  GLUCOSE, CAPILLARY - Abnormal; Notable for the following components:   Glucose-Capillary 138 (*)    All other components within normal limits     IMAGES:  EKG:  04/18/2020 Rate 81 bpm  NSR Left ventricular hypertrophy with repolarization abnormality Cannot rule out septal infarct, age undetermined   CV: Echo 08/25/2013 - Left ventricle: Distal septal apical , mid and basal    inferior akinesis The cavity size was moderately dilated.    Wall thickness was normal. Systolic function was    moderately reduced. The estimated ejection fraction was in    the range of 35% to 40%.  - Mitral valve: S/P mitral valve ring? significant diastolic    gradient and mild residual MR Mild regurgitation. Valve    area by continuity equation (using LVOT flow): 1.41cm^2.  - Left atrium: The atrium was moderately dilated.  - Atrial septum: No defect or patent foramen ovale was    identified.     Past Medical History:  Diagnosis Date   CAD S/P percutaneous coronary angioplasty  03/2010 -- 3-07/2011   Proximal LAD ~80% (into D2) & mid - subtotal occlusion , RCA occlusion, LCx occlusion -- s/p CABG x 3  (LIMA-LAD, SVG-OM, SVG-RPDA); Cath 06/2011, patent grafts with severe proximal  LAD - D1 lesions -- 07/2011: PCI  proxLAD-into D2 with Promus Premier DES 2.5 mm x 38 mm (distal in D2 2.5 mm, @ bifurcation - 2.75 m, in prox LAD ~3.0 mm   Carotid artery disease (HCC)    moderate by 2017 duplex US   Chronic combined systolic and diastolic HF (heart failure), NYHA class 2 ([place]) 03/2010; 08/2103   a) EF ~30-30%; Exertional  Dyspnea; No exaccerbations.;; b)EF 35-40%, Mod LA dilation   DM (diabetes mellitus) type II controlled peripheral vascular disorder 04/16/2011   & CAD   Heart murmur    History of kidney stones    HTN, goal below 130/80 03/15/2010   Hyperlipidemia LDL goal < 70    Ischemic cardiomyopathy 03/2010; May 2015   a) Echo - EF 30-40%; mild/mod anterior wall hypokinesis; doppler flow suggestive of impaired LV relaxation; LA moderately dilated; annuloplasty ring noted in mitral position; mild/mod mitral regurgitation; RVsystolic pressure elevated at 30-61mmHg;;; b) Echo May 2015: EF 35-40%   NSTEMI (non-ST elevated myocardial infarction) ([place]) 09/32/3557   Complicated by Severe Acute Systolic CHF -- Class IV CHF,   S/P CABG x 3 03/15/2010   LIMA-mLAD, SVG-OM, SVG-rPDA   S/P MVR (mitral valve repair) 03/15/2010   @ time of CABG for ischemic MR - 26 mm Edwards physio-2 annuloplasty ring   Tobacco dependence    Initially quit - restarted ~mid 2012; now essentially quit.    Past Surgical History:  Procedure Laterality Date   CORONARY ARTERY BYPASS GRAFT  03/2010   LIMA-LAD, SVG-OM, SVG-RPDA   Left Arm Surgery - NOS     LEFT HEART CATHETERIZATION WITH CORONARY/GRAFT ANGIOGRAM  07/03/2011   Procedure: LEFT HEART CATHETERIZATION WITH Beatrix Fetters;  Surgeon: Leonie Man, MD;  Location: [facility] CATH LAB;  Service: Cardiovascular;Patent LIMA-LAD, SVG-OM, SVG-RPDA; (native RCA & OM prox 100%, prox LAD into major D2 ~80% long lesion, midLAD 100%   MITRAL VALVULOPLASTY  03/2010   conjuntive with CABG for Severe MR; 26 mm Edwards physio-2 annuloplasty ring   NM MYOVIEW LTD  04/24/2011   R/L MV - EF 37%; mild perfusion defect due to attenuation w/mild to mod superimposed ischemia seen in apex, apical lateral and distal to mid ateroseptal region; LV systolic fcn moderately reduced; hypotensive BP response to stress w/o chest pain; high risk scan   PERCUTANEOUS CORONARY STENT INTERVENTION (PCI-S) N/A  08/02/2011   Procedure: PERCUTANEOUS CORONARY STENT INTERVENTION (PCI-S);  Surgeon: Leonie Man, MD;  Location: [facility] CATH LAB;  Service: Cardiovascular;  PCI proximal LAD-D2: Promus Premier DES 2.5 x 38 (tapered post-dilation from 3.0 mm in proximal LAD, 2.75 mm at bifurcation and 2.5 mm in D2)   TONSILLECTOMY     Childhood   TRANSTHORACIC ECHOCARDIOGRAM  03/23/2010    Pre-CABG/MVR: EF 25-30%; global HK; Mod-Severe MR   TRANSTHORACIC ECHOCARDIOGRAM  09/2011; 08/2013   a) EF up to 30-40% (post PCI); mild-mod Ant wall HK; Gd 1 DD; MV Annuloplasty ring in place, mild-mod MR; mild PHTN (30-40 mmHg);; b)  EF 35-40% with moderately reduced function. Moderately dilated LV with distal septal-apical, mid and basal inferior akinesis; status post mitral valve ring. Significant diastolic gradient with mild MR. Moderate LA dilation.     TRANSURETHRAL RESECTION OF BLADDER TUMOR N/A 01/30/2021   Procedure:  RESTAGING TRANSURETHRAL RESECTION OF BLADDER TUMOR (TURBT);  Surgeon: Robley Fries, MD;  Location: [facility];  Service: Urology;  Laterality: N/A;  1 HR   TRANSURETHRAL RESECTION OF BLADDER TUMOR WITH MITOMYCIN-C N/A 12/26/2020   Procedure: TRANSURETHRAL RESECTION OF BLADDER TUMOR WITH GEMCITABINE;  Surgeon: Robley Fries, MD;  Location: [facility];  Service: Urology;  Laterality: N/A;    MEDICATIONS:  Ascorbic Acid (VITAMIN C) 1000 MG tablet   aspirin EC 81 MG tablet   B Complex-C (SUPER B COMPLEX PO)   carvedilol (COREG) 12.5 MG tablet   Cholecalciferol (VITAMIN D-3) 125 MCG (5000 UT) TABS   clopidogrel (PLAVIX) 75 MG tablet   Echinacea 003 MG CAPS   Garlic (GARLIQUE PO)   glimepiride (AMARYL) 4 MG tablet   ibuprofen (ADVIL) 200 MG tablet   JARDIANCE 25 MG TABS tablet   Magnesium 500 MG TABS   Multiple Vitamins-Minerals (MULTIVITAMINS THER. W/MINERALS) TABS   nitrofurantoin, macrocrystal-monohydrate, (MACROBID) 100 MG capsule   Omega-3 Fatty Acids (FISH OIL) 1000 MG CAPS   rosuvastatin (CRESTOR) 40  MG tablet   traMADol (ULTRAM) 50 MG tablet   traMADol (ULTRAM) 50 MG tablet   valsartan (DIOVAN) 80 MG tablet   vitamin E 1000 UNIT capsule   Zinc 50 MG TABS   No current facility-administered medications for this encounter.      Konrad Felix Ward, PA-C [facility] [phone]

## 2021-02-14 NOTE — Addendum Note (Signed)
Addendum  created 02/14/21 1528 by Ward, Lenise Arena, PA-C   Clinical Note Signed

## 2021-02-19 NOTE — Anesthesia Preprocedure Evaluation (Addendum)
Anesthesia Evaluation  Patient identified by MRN, date of birth, ID band Patient awake    Reviewed: Allergy & Precautions, NPO status , Patient's Chart, lab work & pertinent test results  Airway Mallampati: II  TM Distance: >3 FB Neck ROM: Full    Dental no notable dental hx.    Pulmonary neg pulmonary ROS, former smoker,    Pulmonary exam normal breath sounds clear to auscultation       Cardiovascular hypertension, + CAD, + Past MI, + CABG, + Peripheral Vascular Disease and +CHF  + Valvular Problems/Murmurs MR  Rhythm:Regular Rate:Normal + Systolic murmurs    Neuro/Psych negative neurological ROS  negative psych ROS   GI/Hepatic negative GI ROS, Neg liver ROS,   Endo/Other  diabetes, Type 2  Renal/GU negative Renal ROS  negative genitourinary   Musculoskeletal negative musculoskeletal ROS (+)   Abdominal   Peds negative pediatric ROS (+)  Hematology negative hematology ROS (+)   Anesthesia Other Findings   Reproductive/Obstetrics negative OB ROS                            Anesthesia Physical Anesthesia Plan  ASA: 3  Anesthesia Plan: General   Post-op Pain Management:    Induction: Intravenous  PONV Risk Score and Plan: 2 and Ondansetron, Dexamethasone and Treatment may vary due to age or medical condition  Airway Management Planned: LMA  Additional Equipment:   Intra-op Plan:   Post-operative Plan: Extubation in OR  Informed Consent: I have reviewed the patients History and Physical, chart, labs and discussed the procedure including the risks, benefits and alternatives for the proposed anesthesia with the patient or authorized representative who has indicated his/her understanding and acceptance.     Dental advisory given  Plan Discussed with: CRNA and Surgeon  Anesthesia Plan Comments: (ETT if relaxation needed)       Anesthesia Quick Evaluation

## 2021-02-19 NOTE — H&P (Signed)
cc: bladder cancer   11/30/20: 65 year old man with intermittent painless gross hematuria since February. He also passes clots sporadically. He has a significant smoking history as been smoking at least a pack a day since he was a kid. He has a history of kidney stones from his early 11s. He does take Plavix. He reports in intermittently weak stream.   01/12/21: 65 year old man who initially presented with gross hematuria since February 2022 underwent TURBT with intravesical gemcitabine on 12/26/2020. He is here for follow-up today and Foley catheter removal. Pathology report shows high-grade invasive bladder cancer with no muscle present. 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 He feels better since having the surgery.   Initial TURBT with gemcitabine 9/13  Restaging TURBT 10/18   02/12/21: 65 year old man with high-grade T1 bladder cancer who underwent restaging TURBT here for follow-up. Restaging TURBT did not contain muscle. Patient had void trial that was successful today.     ALLERGIES: Morphine Sulfate    MEDICATIONS: Plavix 75 mg tablet  Carvedilol  Glimepiride  Jardiance  Rosuvastatin Calcium  Valsartan     GU PSH: Cystoscopy - 11/30/2020 Cystoscopy TURBT 2-5 cm - 01/30/2021, 12/26/2020 Locm 300-399Mg /Ml Iodine,1Ml - 12/01/2020       PSH Notes: Bypass     NON-GU PSH: Cardiac Stent Placement     GU PMH: Bladder Cancer overlapping sites - 01/12/2021 Bladder tumor/neoplasm - 12/01/2020, - 11/30/2020 Gross hematuria - 12/01/2020, - 11/30/2020    NON-GU PMH: Cardiac murmur, unspecified Diabetes Type 2 Heart disease, unspecified Hypertension Myocardial Infarction Other heart failure    FAMILY HISTORY: 1 Daughter - Runs in Family 1 son - Runs in Family Kidney Failure - Runs in Family   SOCIAL HISTORY: Marital Status: Unknown Current Smoking Status: Patient does not smoke anymore. Has not smoked since 11/14/2010.   Tobacco Use Assessment Completed: Used Tobacco in last 30 days? Does  drink.  Drinks 4+ caffeinated drinks per day.    REVIEW OF SYSTEMS:    GU Review Male:   Patient denies frequent urination, hard to postpone urination, burning/ pain with urination, get up at night to urinate, leakage of urine, stream starts and stops, trouble starting your stream, have to strain to urinate , erection problems, and penile pain.  Gastrointestinal (Upper):   Patient denies nausea, vomiting, and indigestion/ heartburn.  Gastrointestinal (Lower):   Patient denies diarrhea and constipation.  Constitutional:   Patient denies fever, night sweats, weight loss, and fatigue.  Skin:   Patient denies skin rash/ lesion and itching.  Eyes:   Patient denies blurred vision and double vision.  Ears/ Nose/ Throat:   Patient denies sore throat and sinus problems.  Hematologic/Lymphatic:   Patient denies swollen glands and easy bruising.  Cardiovascular:   Patient denies leg swelling and chest pains.  Respiratory:   Patient denies shortness of breath and cough.  Endocrine:   Patient denies excessive thirst.  Musculoskeletal:   Patient denies back pain and joint pain.  Neurological:   Patient denies headaches and dizziness.  Psychologic:   Patient denies depression and anxiety.   VITAL SIGNS:      02/12/2021 01:00 PM  Weight 151 lb / 68.49 kg  Height 67 in / 170.18 cm  BP 163/84 mmHg  Pulse 94 /min  BMI 23.6 kg/m   MULTI-SYSTEM PHYSICAL EXAMINATION:    Constitutional: Well-nourished. No physical deformities. Normally developed. Good grooming.  Neck: Neck symmetrical, not swollen. Normal tracheal position.  Respiratory: No labored breathing, no use of accessory muscles.  Skin: No paleness, no jaundice, no cyanosis. No lesion, no ulcer, no rash.  Neurologic / Psychiatric: Oriented to time, oriented to place, oriented to person. No depression, no anxiety, no agitation.  Gastrointestinal: No rigidity, non obese abdomen.   Eyes: Normal conjunctivae. Normal eyelids.  Ears, Nose, Mouth,  and Throat: Left ear no scars, no lesions, no masses. Right ear no scars, no lesions, no masses. Nose no scars, no lesions, no masses. Normal hearing. Normal lips.  Musculoskeletal: Normal gait and station of head and neck.     Complexity of Data:  Lab Test Review:   Path Report  Records Review:   Previous Patient Records  Urine Test Review:   Urinalysis  Urodynamics Review:   Review Bladder Scan   PROCEDURES:         Voiding Trial - 51700  Voided Volume: 200 cc  Instilled Volume: 230 cc   ASSESSMENT:      ICD-10 Details  1 GU:   Bladder Cancer overlapping sites - C67.8 Undiagnosed New Problem   PLAN:            Medications New Meds: Macrobid 100 mg capsule 1 capsule PO BID   #14  0 Refill(s)            Document Letter(s):  Created for Patient: Clinical Summary         Notes:   1. Bladder cancer: Foley successfully removed today. Repeat pathology showed invasive bladder cancer however no muscle was present. I discussed the results with the patient and his partner. His partner became combative during the procedure and walked out of the visit. I discussed with patient the intraoperative findings and the need to know if this is muscle invasive bladder cancer. My concern is based on the nodular appearance during the restaging TURBT that this may be T2 cancer. I would like to proceed with a repeat biopsy. We will not plan on leaving a Foley catheter at that time. Part of the reason we left it was due to the size of the resection bed and patient's difficulty urinating following the first procedure. I also called the pathologist to discuss this case. The prior resection bed had fibromuscular necrosis but no evidence of cancer. The posterior wall showed residual cancer.  Benefits of a repeat TURBT were discussed with the patient in detail. I would also like to start him on antibiotics leading up to the procedure given recent indwelling Foley catheter. I also offered patient to see one of my  partners if he and his partner felt more comfortable. Patient states he feels comfortable and I am comfortable continuing the physician-patient relationship with him.

## 2021-02-20 ENCOUNTER — Ambulatory Visit (HOSPITAL_COMMUNITY): Payer: Medicare Other | Admitting: Anesthesiology

## 2021-02-20 ENCOUNTER — Encounter (HOSPITAL_COMMUNITY): Payer: Self-pay | Admitting: Urology

## 2021-02-20 ENCOUNTER — Encounter (HOSPITAL_COMMUNITY)
Admission: RE | Disposition: A | Discharge status: Home / Self Care | Payer: Self-pay | Source: Ambulatory Visit | Attending: Urology

## 2021-02-20 ENCOUNTER — Ambulatory Visit (HOSPITAL_COMMUNITY)
Admission: RE | Admit: 2021-02-20 | Discharge: 2021-02-20 | Disposition: A | Discharge status: Home / Self Care | Payer: Medicare Other | Source: Ambulatory Visit | Attending: Urology | Admitting: Urology

## 2021-02-20 DIAGNOSIS — E1151 Type 2 diabetes mellitus with diabetic peripheral angiopathy without gangrene: Secondary | ICD-10-CM | POA: Insufficient documentation

## 2021-02-20 DIAGNOSIS — I509 Heart failure, unspecified: Secondary | ICD-10-CM | POA: Insufficient documentation

## 2021-02-20 DIAGNOSIS — Z87891 Personal history of nicotine dependence: Secondary | ICD-10-CM | POA: Insufficient documentation

## 2021-02-20 DIAGNOSIS — I252 Old myocardial infarction: Secondary | ICD-10-CM | POA: Insufficient documentation

## 2021-02-20 DIAGNOSIS — R319 Hematuria, unspecified: Secondary | ICD-10-CM | POA: Diagnosis not present

## 2021-02-20 DIAGNOSIS — E1169 Type 2 diabetes mellitus with other specified complication: Secondary | ICD-10-CM | POA: Diagnosis not present

## 2021-02-20 DIAGNOSIS — I110 Hypertensive heart disease with heart failure: Secondary | ICD-10-CM | POA: Insufficient documentation

## 2021-02-20 DIAGNOSIS — Z951 Presence of aortocoronary bypass graft: Secondary | ICD-10-CM | POA: Insufficient documentation

## 2021-02-20 DIAGNOSIS — I10 Essential (primary) hypertension: Secondary | ICD-10-CM | POA: Diagnosis not present

## 2021-02-20 DIAGNOSIS — Z87442 Personal history of urinary calculi: Secondary | ICD-10-CM | POA: Insufficient documentation

## 2021-02-20 DIAGNOSIS — C672 Malignant neoplasm of lateral wall of bladder: Secondary | ICD-10-CM | POA: Insufficient documentation

## 2021-02-20 DIAGNOSIS — R3912 Poor urinary stream: Secondary | ICD-10-CM | POA: Insufficient documentation

## 2021-02-20 DIAGNOSIS — I2510 Atherosclerotic heart disease of native coronary artery without angina pectoris: Secondary | ICD-10-CM | POA: Insufficient documentation

## 2021-02-20 HISTORY — PX: TRANSURETHRAL RESECTION OF BLADDER TUMOR: SHX2575

## 2021-02-20 LAB — GLUCOSE, CAPILLARY
Glucose-Capillary: 212 mg/dL — ABNORMAL HIGH (ref 70–99)
Glucose-Capillary: 236 mg/dL — ABNORMAL HIGH (ref 70–99)

## 2021-02-20 SURGERY — TURBT (TRANSURETHRAL RESECTION OF BLADDER TUMOR)
Anesthesia: General

## 2021-02-20 MED ORDER — LIDOCAINE 2% (20 MG/ML) 5 ML SYRINGE
INTRAMUSCULAR | Status: DC | PRN
Start: 2021-02-20 — End: 2021-02-20
  Administered 2021-02-20: 75 mg via INTRAVENOUS

## 2021-02-20 MED ORDER — PROPOFOL 10 MG/ML IV BOLUS
INTRAVENOUS | Status: AC
  Filled 2021-02-20: qty 20

## 2021-02-20 MED ORDER — HYDROMORPHONE HCL 1 MG/ML IJ SOLN
INTRAMUSCULAR | Status: AC
  Filled 2021-02-20: qty 1

## 2021-02-20 MED ORDER — OXYCODONE HCL 5 MG PO TABS
ORAL_TABLET | ORAL | Status: AC
  Filled 2021-02-20: qty 1

## 2021-02-20 MED ORDER — ONDANSETRON HCL 4 MG/2ML IJ SOLN: 4.0000 mg | Freq: Once | INTRAMUSCULAR | Status: DC | PRN

## 2021-02-20 MED ORDER — SUGAMMADEX SODIUM 500 MG/5ML IV SOLN
INTRAVENOUS | Status: AC
  Filled 2021-02-20: qty 5

## 2021-02-20 MED ORDER — ONDANSETRON HCL 4 MG/2ML IJ SOLN
INTRAMUSCULAR | Status: AC
  Filled 2021-02-20: qty 2

## 2021-02-20 MED ORDER — CEFAZOLIN SODIUM-DEXTROSE 2-4 GM/100ML-% IV SOLN
2.0000 g | Freq: Once | INTRAVENOUS | Status: AC
  Administered 2021-02-20: 2 g via INTRAVENOUS
  Filled 2021-02-20: qty 100

## 2021-02-20 MED ORDER — INSULIN ASPART 100 UNIT/ML IJ SOLN
INTRAMUSCULAR | Status: AC
  Filled 2021-02-20: qty 1

## 2021-02-20 MED ORDER — OXYCODONE HCL 5 MG/5ML PO SOLN: 5.0000 mg | Freq: Once | ORAL | Status: AC | PRN

## 2021-02-20 MED ORDER — FENTANYL CITRATE (PF) 100 MCG/2ML IJ SOLN
INTRAMUSCULAR | Status: AC
  Filled 2021-02-20: qty 2

## 2021-02-20 MED ORDER — OXYCODONE HCL 5 MG PO TABS
5.0000 mg | ORAL_TABLET | Freq: Once | ORAL | Status: AC | PRN
  Administered 2021-02-20: 5 mg via ORAL

## 2021-02-20 MED ORDER — INSULIN ASPART 100 UNIT/ML IJ SOLN
5.0000 [IU] | Freq: Once | INTRAMUSCULAR | Status: AC
  Administered 2021-02-20: 5 [IU] via SUBCUTANEOUS

## 2021-02-20 MED ORDER — ROCURONIUM BROMIDE 100 MG/10ML IV SOLN
INTRAVENOUS | Status: DC | PRN
  Administered 2021-02-20: 60 mg via INTRAVENOUS

## 2021-02-20 MED ORDER — HYDROMORPHONE HCL 1 MG/ML IJ SOLN
0.2500 mg | INTRAMUSCULAR | Status: DC | PRN
  Administered 2021-02-20 (×4): 0.5 mg via INTRAVENOUS

## 2021-02-20 MED ORDER — KETOROLAC TROMETHAMINE 30 MG/ML IJ SOLN
30.0000 mg | Freq: Once | INTRAMUSCULAR | Status: AC | PRN
  Administered 2021-02-20: 30 mg via INTRAVENOUS

## 2021-02-20 MED ORDER — LACTATED RINGERS IV SOLN: INTRAVENOUS | Status: DC

## 2021-02-20 MED ORDER — KETOROLAC TROMETHAMINE 30 MG/ML IJ SOLN
INTRAMUSCULAR | Status: AC
  Filled 2021-02-20: qty 1

## 2021-02-20 MED ORDER — ONDANSETRON HCL 4 MG/2ML IJ SOLN
INTRAMUSCULAR | Status: DC | PRN
  Administered 2021-02-20: 4 mg via INTRAVENOUS

## 2021-02-20 MED ORDER — SUGAMMADEX SODIUM 500 MG/5ML IV SOLN
INTRAVENOUS | Status: DC | PRN
  Administered 2021-02-20: 300 mg via INTRAVENOUS

## 2021-02-20 MED ORDER — MIDAZOLAM HCL 5 MG/5ML IJ SOLN
INTRAMUSCULAR | Status: DC | PRN
  Administered 2021-02-20: 1 mg via INTRAVENOUS

## 2021-02-20 MED ORDER — LABETALOL HCL 5 MG/ML IV SOLN
INTRAVENOUS | Status: AC
  Filled 2021-02-20: qty 4

## 2021-02-20 MED ORDER — FENTANYL CITRATE (PF) 100 MCG/2ML IJ SOLN
INTRAMUSCULAR | Status: DC | PRN
  Administered 2021-02-20 (×2): 50 ug via INTRAVENOUS

## 2021-02-20 MED ORDER — LABETALOL HCL 5 MG/ML IV SOLN
INTRAVENOUS | Status: DC | PRN
  Administered 2021-02-20: 5 mg via INTRAVENOUS

## 2021-02-20 MED ORDER — DEXAMETHASONE SODIUM PHOSPHATE 10 MG/ML IJ SOLN
INTRAMUSCULAR | Status: DC | PRN
  Administered 2021-02-20: 6 mg via INTRAVENOUS

## 2021-02-20 MED ORDER — PROPOFOL 10 MG/ML IV BOLUS
INTRAVENOUS | Status: DC | PRN
  Administered 2021-02-20: 80 mg via INTRAVENOUS
  Administered 2021-02-20: 20 mg via INTRAVENOUS
  Administered 2021-02-20: 40 mg via INTRAVENOUS

## 2021-02-20 MED ORDER — FENTANYL CITRATE (PF) 250 MCG/5ML IJ SOLN
INTRAMUSCULAR | Status: AC
  Filled 2021-02-20: qty 5

## 2021-02-20 MED ORDER — MIDAZOLAM HCL 2 MG/2ML IJ SOLN
INTRAMUSCULAR | Status: AC
  Filled 2021-02-20: qty 2

## 2021-02-20 MED ORDER — PHENYLEPHRINE HCL (PRESSORS) 10 MG/ML IV SOLN
INTRAVENOUS | Status: AC
  Filled 2021-02-20: qty 2

## 2021-02-20 MED ORDER — 0.9 % SODIUM CHLORIDE (POUR BTL) OPTIME
TOPICAL | Status: DC | PRN
  Administered 2021-02-20 (×2): 1000 mL

## 2021-02-20 MED ORDER — DEXAMETHASONE SODIUM PHOSPHATE 10 MG/ML IJ SOLN
INTRAMUSCULAR | Status: AC
  Filled 2021-02-20: qty 1

## 2021-02-20 MED ORDER — SODIUM CHLORIDE 0.9 % IR SOLN
Status: DC | PRN
  Administered 2021-02-20 (×3): 3000 mL via INTRAVESICAL

## 2021-02-20 SURGICAL SUPPLY — 18 items
BAG DRN RND TRDRP ANRFLXCHMBR (UROLOGICAL SUPPLIES) ×1
BAG URINE DRAIN 2000ML AR STRL (UROLOGICAL SUPPLIES) ×2 IMPLANT
BAG URO CATCHER STRL LF (MISCELLANEOUS) ×3 IMPLANT
CATH TIEMANN FOLEY 18FR 5CC (CATHETERS) ×2 IMPLANT
CLOTH BEACON ORANGE TIMEOUT ST (SAFETY) ×3 IMPLANT
DRAPE FOOT SWITCH (DRAPES) ×3 IMPLANT
ELECT REM PT RETURN 15FT ADLT (MISCELLANEOUS) ×3 IMPLANT
GLOVE SURG ENC MOIS LTX SZ6.5 (GLOVE) ×3 IMPLANT
GOWN STRL REUS W/TWL LRG LVL3 (GOWN DISPOSABLE) ×3 IMPLANT
KIT TURNOVER KIT A (KITS) IMPLANT
LOOP CUT BIPOLAR 24F LRG (ELECTROSURGICAL) ×2 IMPLANT
MANIFOLD NEPTUNE II (INSTRUMENTS) ×3 IMPLANT
PACK CYSTO (CUSTOM PROCEDURE TRAY) ×3 IMPLANT
SYR TOOMEY IRRIG 70ML (MISCELLANEOUS) ×3
SYRINGE TOOMEY IRRIG 70ML (MISCELLANEOUS) IMPLANT
TUBING CONNECTING 10 (TUBING) ×2 IMPLANT
TUBING CONNECTING 10' (TUBING) ×1
TUBING UROLOGY SET (TUBING) ×3 IMPLANT

## 2021-02-20 NOTE — Anesthesia Procedure Notes (Signed)
Procedure Name: Intubation Date/Time: 02/20/2021 7:33 AM Performed by: Lissa Morales, CRNA Pre-anesthesia Checklist: Patient identified, Emergency Drugs available, Suction available and Patient being monitored Patient Re-evaluated:Patient Re-evaluated prior to induction Oxygen Delivery Method: Circle system utilized Preoxygenation: Pre-oxygenation with 100% oxygen Induction Type: IV induction Ventilation: Mask ventilation without difficulty Grade View: Grade II Tube type: Oral Tube size: 7.5 mm Number of attempts: 1 Airway Equipment and Method: Stylet and Oral airway Placement Confirmation: ETT inserted through vocal cords under direct vision, positive ETCO2 and breath sounds checked- equal and bilateral Secured at: 22 cm Tube secured with: Tape Dental Injury: Teeth and Oropharynx as per pre-operative assessment

## 2021-02-20 NOTE — Discharge Instructions (Signed)

## 2021-02-20 NOTE — Interval H&P Note (Signed)
History and Physical Interval Note:  02/20/2021 7:16 AM  Eric Bradshaw  has presented today for surgery, with the diagnosis of BLADDER CANCER.  The various methods of treatment have been discussed with the patient and family. After consideration of risks, benefits and other options for treatment, the patient has consented to  Procedure(s) with comments: TRANSURETHRAL RESECTION OF BLADDER TUMOR (TURBT), RESTAGING (N/A) - GENERAL ANESTHESIA WITH PARALYSIS as a surgical intervention.  The patient's history has been reviewed, patient examined, no change in status, stable for surgery.  I have reviewed the patient's chart and labs.  Questions were answered to the patient's satisfaction.     Eric Bradshaw D Hurschel Paynter

## 2021-02-20 NOTE — Anesthesia Postprocedure Evaluation (Signed)
Anesthesia Post Note  Patient: Eric Bradshaw  Procedure(s) Performed: TRANSURETHRAL RESECTION OF BLADDER TUMOR (TURBT), RESTAGING     Patient location during evaluation: PACU Anesthesia Type: General Level of consciousness: awake and alert Pain management: pain level controlled Vital Signs Assessment: post-procedure vital signs reviewed and stable Respiratory status: spontaneous breathing, nonlabored ventilation, respiratory function stable and patient connected to nasal cannula oxygen Cardiovascular status: blood pressure returned to baseline and stable Postop Assessment: no apparent nausea or vomiting Anesthetic complications: no   No notable events documented.  Last Vitals:  Vitals:   02/20/21 0900 02/20/21 0915  BP: 128/76 126/78  Pulse: 89 84  Resp: 14 (!) 9  Temp:    SpO2: 90% 91%    Last Pain:  Vitals:   02/20/21 0915  TempSrc:   PainSc: Asleep                 Daulton Harbaugh S

## 2021-02-20 NOTE — Transfer of Care (Signed)
Immediate Anesthesia Transfer of Care Note  Patient: Eric Bradshaw  Procedure(s) Performed: TRANSURETHRAL RESECTION OF BLADDER TUMOR (TURBT), RESTAGING  Patient Location: PACU  Anesthesia Type:General  Level of Consciousness: awake, alert , oriented and patient cooperative  Airway & Oxygen Therapy: Patient Spontanous Breathing and Patient connected to face mask oxygen  Post-op Assessment: Report given to RN, Post -op Vital signs reviewed and stable and Patient moving all extremities X 4  Post vital signs: stable  Last Vitals:  Vitals Value Taken Time  BP 137/77 02/20/21 0830  Temp 36.5 C 02/20/21 0825  Pulse 88 02/20/21 0831  Resp 23 02/20/21 0831  SpO2 93 % 02/20/21 0831  Vitals shown include unvalidated device data.  Last Pain:  Vitals:   02/20/21 0825  TempSrc:   PainSc: 7       Patients Stated Pain Goal: 4 (48/54/62 7035)  Complications: No notable events documented.

## 2021-02-20 NOTE — Op Note (Signed)
PATIENT:  Eric Bradshaw  PRE-OPERATIVE DIAGNOSIS: Bladder tumor  POST-OPERATIVE DIAGNOSIS: Same  PROCEDURE:  Procedure(s): 1. Restaging transurethral resection of bladder tumor >5 cm   SURGEON:  Jacalyn Lefevre, MD  ANESTHESIA:   General  EBL:  minimal  DRAINS: Urethral catheter (18 Fr. Foley)   SPECIMEN:  Bladder tumor  DISPOSITION OF SPECIMEN:  PATHOLOGY  Indication: 65 year old man who initially presented with gross hematuria found to have high-grade T1 bladder cancer on initial TURBT.  Restaging TURBT showed residual tumor however no muscle was present.  He returns to the OR for another restaging TURBT.  Description of operation: The patient was taken to the operating room and administered general anesthesia. They were then placed on the table and moved to the dorsal lithotomy position after which the genitalia was sterilely prepped and draped. An official timeout was then performed.  The 43 French resectoscope with the 30 lens and visual obturator were then passed into the bladder under direct visualization. Urethra appeared normal. The visual obturator was then removed and the Gyrus resectoscope element with 30  lens was then inserted and the bladder was fully and systematically inspected. Ureteral orifices were noted to be in the normal anatomic positions.   The prior resection bed was noted again at the left lateral and superior wall.  Necrotic mucosa was then seen sloughing off of the resection bed.  There was still some nodularity noted on the medial border.  A cold cup bladder biopsy was then used to take 2 samples from this posterior medial bladder wall.  Next the bipolar loop was used to resect any remaining abnormal mucosa.  This was sent as separate specimen to pathology labeled lateral wall.  Bipolar electrocautery was then used to achieve hemostasis.  The overall resection bed was noted to be greater than 5 cm in size.  Bilateral ureteral orifices were noted at the  end of the case and uninvolved. The Toomey syringe was then used to irrigate the bladder and remove all of the portions of bladder tumor which were sent to pathology. I then removed the resectoscope.  A 20 French Foley catheter was then inserted in the bladder and irrigated. The irrigant returned slightly pink with no clots. The patient was awakened and taken to the recovery room.   PLAN OF CARE: Discharge to home after PACU.  Due to extent of tumor and resection bed decision was made to leave Foley catheter for several days.  He will remain on antibiotics.  Foley to be removed in the office later this week.  PATIENT DISPOSITION:  PACU - hemodynamically stable.

## 2021-02-21 ENCOUNTER — Encounter (HOSPITAL_COMMUNITY): Payer: Self-pay | Admitting: Urology

## 2021-02-21 ENCOUNTER — Other Ambulatory Visit: Payer: Self-pay

## 2021-02-21 ENCOUNTER — Inpatient Hospital Stay (HOSPITAL_COMMUNITY)
Admission: EM | Admit: 2021-02-21 | Discharge: 2021-02-23 | DRG: 664 | Disposition: A | Discharge status: Home / Self Care | Payer: Medicare Other | Attending: Urology | Admitting: Urology

## 2021-02-21 DIAGNOSIS — Z79899 Other long term (current) drug therapy: Secondary | ICD-10-CM

## 2021-02-21 DIAGNOSIS — I10 Essential (primary) hypertension: Secondary | ICD-10-CM | POA: Diagnosis present

## 2021-02-21 DIAGNOSIS — Z952 Presence of prosthetic heart valve: Secondary | ICD-10-CM

## 2021-02-21 DIAGNOSIS — Z7902 Long term (current) use of antithrombotics/antiplatelets: Secondary | ICD-10-CM

## 2021-02-21 DIAGNOSIS — R3129 Other microscopic hematuria: Secondary | ICD-10-CM

## 2021-02-21 DIAGNOSIS — C672 Malignant neoplasm of lateral wall of bladder: Principal | ICD-10-CM | POA: Diagnosis present

## 2021-02-21 DIAGNOSIS — Z7984 Long term (current) use of oral hypoglycemic drugs: Secondary | ICD-10-CM

## 2021-02-21 DIAGNOSIS — Z8249 Family history of ischemic heart disease and other diseases of the circulatory system: Secondary | ICD-10-CM

## 2021-02-21 DIAGNOSIS — Z87442 Personal history of urinary calculi: Secondary | ICD-10-CM

## 2021-02-21 DIAGNOSIS — Z955 Presence of coronary angioplasty implant and graft: Secondary | ICD-10-CM

## 2021-02-21 DIAGNOSIS — Z823 Family history of stroke: Secondary | ICD-10-CM

## 2021-02-21 DIAGNOSIS — R319 Hematuria, unspecified: Secondary | ICD-10-CM | POA: Diagnosis present

## 2021-02-21 DIAGNOSIS — Z885 Allergy status to narcotic agent status: Secondary | ICD-10-CM

## 2021-02-21 DIAGNOSIS — I252 Old myocardial infarction: Secondary | ICD-10-CM

## 2021-02-21 DIAGNOSIS — Z7982 Long term (current) use of aspirin: Secondary | ICD-10-CM

## 2021-02-21 DIAGNOSIS — Z87891 Personal history of nicotine dependence: Secondary | ICD-10-CM

## 2021-02-21 DIAGNOSIS — E1151 Type 2 diabetes mellitus with diabetic peripheral angiopathy without gangrene: Secondary | ICD-10-CM | POA: Diagnosis present

## 2021-02-21 DIAGNOSIS — Z792 Long term (current) use of antibiotics: Secondary | ICD-10-CM

## 2021-02-21 DIAGNOSIS — Z801 Family history of malignant neoplasm of trachea, bronchus and lung: Secondary | ICD-10-CM

## 2021-02-21 DIAGNOSIS — N401 Enlarged prostate with lower urinary tract symptoms: Secondary | ICD-10-CM | POA: Diagnosis present

## 2021-02-21 DIAGNOSIS — R310 Gross hematuria: Secondary | ICD-10-CM | POA: Diagnosis present

## 2021-02-21 DIAGNOSIS — E785 Hyperlipidemia, unspecified: Secondary | ICD-10-CM | POA: Diagnosis present

## 2021-02-21 DIAGNOSIS — Z841 Family history of disorders of kidney and ureter: Secondary | ICD-10-CM

## 2021-02-21 DIAGNOSIS — Z833 Family history of diabetes mellitus: Secondary | ICD-10-CM

## 2021-02-21 DIAGNOSIS — Z951 Presence of aortocoronary bypass graft: Secondary | ICD-10-CM

## 2021-02-21 DIAGNOSIS — E1169 Type 2 diabetes mellitus with other specified complication: Secondary | ICD-10-CM | POA: Diagnosis present

## 2021-02-21 DIAGNOSIS — R338 Other retention of urine: Secondary | ICD-10-CM | POA: Diagnosis present

## 2021-02-21 LAB — CBC
HCT: 26.6 % — ABNORMAL LOW (ref 39.0–52.0)
Hemoglobin: 8.8 g/dL — ABNORMAL LOW (ref 13.0–17.0)
MCH: 30.8 pg (ref 26.0–34.0)
MCHC: 33.1 g/dL (ref 30.0–36.0)
MCV: 93 fL (ref 80.0–100.0)
Platelets: 156 10*3/uL (ref 150–400)
RBC: 2.86 MIL/uL — ABNORMAL LOW (ref 4.22–5.81)
RDW: 14.2 % (ref 11.5–15.5)
WBC: 11.6 10*3/uL — ABNORMAL HIGH (ref 4.0–10.5)
nRBC: 0 % (ref 0.0–0.2)

## 2021-02-21 LAB — BASIC METABOLIC PANEL
Anion gap: 5 (ref 5–15)
BUN: 27 mg/dL — ABNORMAL HIGH (ref 8–23)
CO2: 24 mmol/L (ref 22–32)
Calcium: 8.4 mg/dL — ABNORMAL LOW (ref 8.9–10.3)
Chloride: 112 mmol/L — ABNORMAL HIGH (ref 98–111)
Creatinine, Ser: 1.15 mg/dL (ref 0.61–1.24)
GFR, Estimated: >60 mL/min (ref >60)
Glucose, Bld: 115 mg/dL — ABNORMAL HIGH (ref 70–99)
Potassium: 4 mmol/L (ref 3.5–5.1)
Sodium: 141 mmol/L (ref 135–145)

## 2021-02-21 LAB — COMPREHENSIVE METABOLIC PANEL
ALT: 13 U/L (ref 0–44)
AST: 16 U/L (ref 15–41)
Albumin: 3.4 g/dL — ABNORMAL LOW (ref 3.5–5.0)
Alkaline Phosphatase: 74 U/L (ref 38–126)
Anion gap: 5 (ref 5–15)
BUN: 32 mg/dL — ABNORMAL HIGH (ref 8–23)
CO2: 23 mmol/L (ref 22–32)
Calcium: 8.4 mg/dL — ABNORMAL LOW (ref 8.9–10.3)
Chloride: 109 mmol/L (ref 98–111)
Creatinine, Ser: 1.72 mg/dL — ABNORMAL HIGH (ref 0.61–1.24)
GFR, Estimated: 43 mL/min — ABNORMAL LOW (ref >60)
Glucose, Bld: 240 mg/dL — ABNORMAL HIGH (ref 70–99)
Potassium: 4.2 mmol/L (ref 3.5–5.1)
Sodium: 137 mmol/L (ref 135–145)
Total Bilirubin: 0.6 mg/dL (ref 0.3–1.2)
Total Protein: 5.6 g/dL — ABNORMAL LOW (ref 6.5–8.1)

## 2021-02-21 LAB — CBC WITH DIFFERENTIAL/PLATELET
Abs Immature Granulocytes: 0.05 10*3/uL (ref 0.00–0.07)
Basophils Absolute: 0 10*3/uL (ref 0.0–0.1)
Basophils Relative: 0 %
Eosinophils Absolute: 0 10*3/uL (ref 0.0–0.5)
Eosinophils Relative: 0 %
HCT: 27.2 % — ABNORMAL LOW (ref 39.0–52.0)
Hemoglobin: 9.3 g/dL — ABNORMAL LOW (ref 13.0–17.0)
Immature Granulocytes: 0 %
Lymphocytes Relative: 10 %
Lymphs Abs: 1.4 10*3/uL (ref 0.7–4.0)
MCH: 31.4 pg (ref 26.0–34.0)
MCHC: 34.2 g/dL (ref 30.0–36.0)
MCV: 91.9 fL (ref 80.0–100.0)
Monocytes Absolute: 1.1 10*3/uL — ABNORMAL HIGH (ref 0.1–1.0)
Monocytes Relative: 8 %
Neutro Abs: 11.3 10*3/uL — ABNORMAL HIGH (ref 1.7–7.7)
Neutrophils Relative %: 82 %
Platelets: 173 10*3/uL (ref 150–400)
RBC: 2.96 MIL/uL — ABNORMAL LOW (ref 4.22–5.81)
RDW: 14 % (ref 11.5–15.5)
WBC: 13.8 10*3/uL — ABNORMAL HIGH (ref 4.0–10.5)
nRBC: 0 % (ref 0.0–0.2)

## 2021-02-21 LAB — TYPE AND SCREEN
ABO/RH(D): O NEG
Antibody Screen: NEGATIVE

## 2021-02-21 MED ORDER — HYDROMORPHONE HCL 1 MG/ML IJ SOLN: 0.5000 mg | Freq: Once | INTRAMUSCULAR | Status: DC

## 2021-02-21 MED ORDER — LACTATED RINGERS IV SOLN
INTRAVENOUS | Status: DC
Start: 2021-02-21 — End: 2021-02-21

## 2021-02-21 MED ORDER — SODIUM CHLORIDE 0.9 % IV BOLUS
1000.0000 mL | Freq: Once | INTRAVENOUS | Status: AC
  Administered 2021-02-21: 1000 mL via INTRAVENOUS

## 2021-02-21 MED ORDER — SODIUM CHLORIDE 0.45 % IV SOLN: INTRAVENOUS | Status: DC

## 2021-02-21 MED ORDER — ACETAMINOPHEN 10 MG/ML IV SOLN
1000.0000 mg | Freq: Four times a day (QID) | INTRAVENOUS | Status: AC
  Administered 2021-02-21 – 2021-02-22 (×3): 1000 mg via INTRAVENOUS
  Filled 2021-02-21 (×4): qty 100

## 2021-02-21 MED ORDER — CIPROFLOXACIN HCL 500 MG PO TABS
500.0000 mg | ORAL_TABLET | Freq: Two times a day (BID) | ORAL | Status: DC
  Administered 2021-02-21 – 2021-02-23 (×5): 500 mg via ORAL
  Filled 2021-02-21 (×5): qty 1

## 2021-02-21 MED ORDER — BELLADONNA ALKALOIDS-OPIUM 16.2-60 MG RE SUPP
1.0000 | Freq: Three times a day (TID) | RECTAL | Status: DC | PRN
  Administered 2021-02-21 – 2021-02-23 (×4): 1 via RECTAL
  Filled 2021-02-21 (×3): qty 1

## 2021-02-21 NOTE — ED Triage Notes (Signed)
Pt a transfer from [facility], had TURP yesterday, here for gross hematuria with large clots. Per pt, hematuria is present when he left the hospital yesterday. H/O bladder cancer  EMS vs pta: 90/50 80s

## 2021-02-21 NOTE — ED Notes (Signed)
Pt's significant other at bedside.

## 2021-02-21 NOTE — ED Provider Notes (Signed)
[facility] DEPT Provider Note   CSN: [id number] Arrival date & time: 02/21/21  0700     History Chief Complaint  Patient presents with   Hematuria    Eric Bradshaw is a 65 y.o. male.  HPI  65 year old male with past medical history of CAD status post CABG, MVR, ICM, HTN, HLD, DM, recent TURP hematuria presents the emergency department as a transfer for concern of hematuria and pain.  Patient currently has an indwelling Foley draining red urine.  Outside facility showed blood work with a hemoglobin of 12.7 and a CAT scan that showed large clot burden in the bladder with possible bladder rupture.  Transferred here for urologic evaluation.  On arrival patient continues to complain of lower pelvic/back pain and spasm.  He is on aspirin and Plavix and did take his Plavix dose when he got home.  Past Medical History:  Diagnosis Date   CAD S/P percutaneous coronary angioplasty 03/2010 -- 3-07/2011   Proximal LAD ~80% (into D2) & mid - subtotal occlusion , RCA occlusion, LCx occlusion -- s/p CABG x 3  (LIMA-LAD, SVG-OM, SVG-RPDA); Cath 06/2011, patent grafts with severe proximal  LAD - D1 lesions -- 07/2011: PCI  proxLAD-into D2 with Promus Premier DES 2.5 mm x 38 mm (distal in D2 2.5 mm, @ bifurcation - 2.75 m, in prox LAD ~3.0 mm   Carotid artery disease (HCC)    moderate by 2017 duplex US   Chronic combined systolic and diastolic HF (heart failure), NYHA class 2 ([place]) 03/2010; 08/2103   a) EF ~30-30%; Exertional Dyspnea; No exaccerbations.;; b)EF 35-40%, Mod LA dilation   DM (diabetes mellitus) type II controlled peripheral vascular disorder 04/16/2011   & CAD   Heart murmur    History of kidney stones    HTN, goal below 130/80 03/15/2010   Hyperlipidemia LDL goal < 70    Ischemic cardiomyopathy 03/2010; May 2015   a) Echo - EF 30-40%; mild/mod anterior wall hypokinesis; doppler flow suggestive of impaired LV relaxation; LA moderately dilated;  annuloplasty ring noted in mitral position; mild/mod mitral regurgitation; RVsystolic pressure elevated at 30-76mmHg;;; b) Echo May 2015: EF 35-40%   NSTEMI (non-ST elevated myocardial infarction) ([place]) 22/05/5425   Complicated by Severe Acute Systolic CHF -- Class IV CHF,   S/P CABG x 3 03/15/2010   LIMA-mLAD, SVG-OM, SVG-rPDA   S/P MVR (mitral valve repair) 03/15/2010   @ time of CABG for ischemic MR - 26 mm Edwards physio-2 annuloplasty ring   Tobacco dependence    Initially quit - restarted ~mid 2012; now essentially quit.    Patient Active Problem List   Diagnosis Date Noted   Presence of drug coated stent in LAD coronary artery 03/18/2019   Diabetes mellitus ([place]) 02/12/2016   Right carotid bruit 02/07/2016   Abnormal EKG- "inferior, laterl infarct" is chronic 02/24/2013   Diabetes mellitus type 2 in nonobese ([place]) 02/24/2013   Overweight (BMI 25.0-29.9)  08/27/2012   CAD - s/p CABG x 17 Mar 2010. Abnormal Myoview-LAD & D1 disease 06/2011 Rx'd with Promus DES 03/15/2010    Class: History of   Essential hypertension 03/15/2010    Class: History of   Hyperlipidemia associated with type 2 diabetes mellitus ([place]) 03/15/2010    Class: Chronic   Tobacco dependence 03/15/2010   NSTEMI (non-ST elevated myocardial infarction) ([place]) 03/15/2010    Class: History of   S/P CABG x 3 03/15/2010   S/P MVR (mitral valve repair) 03/15/2010  Ischemic Cardiomyopathy - EF ~35 -40%; Class I-II CHF 03/15/2000    Class: Chronic    Past Surgical History:  Procedure Laterality Date   CORONARY ARTERY BYPASS GRAFT  03/2010   LIMA-LAD, SVG-OM, SVG-RPDA   Left Arm Surgery - NOS     LEFT HEART CATHETERIZATION WITH CORONARY/GRAFT ANGIOGRAM  07/03/2011   Procedure: LEFT HEART CATHETERIZATION WITH Beatrix Fetters;  Surgeon: Leonie Man, MD;  Location: [facility] CATH LAB;  Service: Cardiovascular;Patent LIMA-LAD, SVG-OM, SVG-RPDA; (native RCA & OM prox 100%, prox LAD into major D2 ~80% long lesion,  midLAD 100%   MITRAL VALVULOPLASTY  03/2010   conjuntive with CABG for Severe MR; 26 mm Edwards physio-2 annuloplasty ring   NM MYOVIEW LTD  04/24/2011   R/L MV - EF 37%; mild perfusion defect due to attenuation w/mild to mod superimposed ischemia seen in apex, apical lateral and distal to mid ateroseptal region; LV systolic fcn moderately reduced; hypotensive BP response to stress w/o chest pain; high risk scan   PERCUTANEOUS CORONARY STENT INTERVENTION (PCI-S) N/A 08/02/2011   Procedure: PERCUTANEOUS CORONARY STENT INTERVENTION (PCI-S);  Surgeon: Leonie Man, MD;  Location[facility] C[facility] LAB;  Service: Cardiovascular;  PCI proximal LAD-D2: Promus Premier DES 2.5 x 38 (tapered post-dilation from 3.0 mm in proximal LAD, 2.75 mm at bifurcation and 2.5 mm in D2)   TONSILLECTOMY     Childhood   TRANSTHORACIC ECHOCARDIOGRAM  03/23/2010    Pre-CABG/MVR: EF 25-30%; global HK; Mod-Severe MR   TRANSTHORACIC ECHOCARDIOGRAM  09/2011; 08/2013   a) EF up to 30-40% (post PCI); mild-mod Ant wall HK; Gd 1 DD; MV Annuloplasty ring in place, mild-mod MR; mild PHTN (30-40 mmHg);; b)  EF 35-40% with moderately reduced function. Moderately dilated LV with distal septal-apical, mid and basal inferior akinesis; status post mitral valve ring. Significant diastolic gradient with mild MR. Moderate LA dilation.     TRANSURETHRAL RESECTION OF BLADDER TUMOR N/A 01/30/2021   Procedure: RESTAGING TRANSURETHRAL RESECTION OF BLADDER TUMOR (TURBT);  Surgeon: Robley Fries, MD;  Location: [facility] ORS;  Service: Urology;  Laterality: N/A;  1 HR   TRANSURETHRAL RESECTION OF BLADDER TUMOR N/A 02/20/2021   Procedure: TRANSURETHRAL RESECTION OF BLADDER TUMOR (TURBT), RESTAGING;  Surgeon: Robley Fries, MD;  Location: [facility] ORS;  Service: Urology;  Laterality: N/A;  GENERAL ANESTHESIA WITH PARALYSIS   TRANSURETHRAL RESECTION OF BLADDER TUMOR WITH MITOMYCIN-C N/A 12/26/2020   Procedure: TRANSURETHRAL RESECTION OF BLADDER TUMOR WITH GEMCITABINE;   Surgeon: Robley Fries, MD;  Location: [facility] ORS;  Service: Urology;  Laterality: N/A;       Family History  Problem Relation Age of Onset   Heart failure Father 83       History of MI & severe CAD; recently deceased.   Lung cancer Mother    Cancer Maternal Grandmother    Stroke Maternal Grandfather    Diabetes Paternal Grandmother    Diabetes Paternal Grandfather    Stroke Paternal Grandfather    Kidney failure Brother        deceased at 8 (was born premature)    Social History   Tobacco Use   Smoking status: Former    Packs/day: 0.00    Years: 50.00    Pack years: 0.00    Types: Cigarettes   Smokeless tobacco: Former    Types: Chew   Tobacco comments:    Quit in 2011 but restarted mid 2012,uses E-cigarettes; Not actually smoking, but does have 2nd hand exposure.  Vaping Use  Vaping Use: Former   Start date: 02/28/2016  Substance Use Topics   Alcohol use: Yes    Alcohol/week: 3.0 standard drinks    Types: 3 Standard drinks or equivalent per week    Comment: 3-6 Beers per week   Drug use: No    Home Medications Prior to Admission medications   Medication Sig Start Date End Date Taking? Authorizing Provider  Ascorbic Acid (VITAMIN C) 1000 MG tablet Take 1,000 mg by mouth in the morning.    [provider]  aspirin EC 81 MG tablet Take 81 mg by mouth at bedtime.    [provider]  B Complex-C (SUPER B COMPLEX PO) Take 1 capsule by mouth daily.    [provider]  carvedilol (COREG) 12.5 MG tablet TAKE 1/2 TABLET BY MOUTH EVERY MORNING AND 1 TABLET AT NIGHT 05/02/20   Leonie Man, MD  Cholecalciferol (VITAMIN D-3) 125 MCG (5000 UT) TABS Take 5,000 Units by mouth in the morning.    [provider]  clopidogrel (PLAVIX) 75 MG tablet TAKE 1 TABLET(75 MG) BY MOUTH DAILY 03/23/20   Leonie Man, MD  Echinacea 400 MG CAPS Take 400 mg by mouth in the morning.    [provider]  Garlic (GARLIQUE PO) Take 1 tablet by  mouth in the morning.    [provider]  glimepiride (AMARYL) 4 MG tablet Take 4 mg by mouth 2 (two) times daily. 01/21/20   [provider]  ibuprofen (ADVIL) 200 MG tablet Take 200-400 mg by mouth every 6 (six) hours as needed (pain.).    [provider]  JARDIANCE 25 MG TABS tablet Take 25 mg by mouth in the morning. 03/23/20   [provider]  Magnesium 500 MG TABS Take 500 mg by mouth in the morning.    [provider]  Multiple Vitamins-Minerals (MULTIVITAMINS THER. W/MINERALS) TABS Take 1 tablet by mouth daily.    [provider]  nitrofurantoin, macrocrystal-monohydrate, (MACROBID) 100 MG capsule Take 100 mg by mouth 2 (two) times daily. 02/12/21   [provider]  Omega-3 Fatty Acids (FISH OIL) 1000 MG CAPS Take 1,000 mg by mouth in the morning.    [provider]  rosuvastatin (CRESTOR) 40 MG tablet Take 1 tablet (40 mg total) by mouth daily. Patient taking differently: Take 40 mg by mouth every evening. 03/18/19 12/23/22  Leonie Man, MD  traMADol (ULTRAM) 50 MG tablet Take 1 tablet (50 mg total) by mouth every 6 (six) hours as needed. Patient not taking: Reported on 01/23/2021 12/26/20 12/26/21  Robley Fries, MD  traMADol (ULTRAM) 50 MG tablet Take 1 tablet (50 mg total) by mouth every 6 (six) hours as needed. 01/30/21 01/30/22  Robley Fries, MD  valsartan (DIOVAN) 80 MG tablet TAKE 1 TABLET(80 MG) BY MOUTH DAILY 04/05/20   Leonie Man, MD  vitamin E 1000 UNIT capsule Take 1,000 Units by mouth daily.    [provider]  Zinc 50 MG TABS Take 50 mg by mouth in the morning.    [provider]    Allergies    Morphine and related  Review of Systems   Review of Systems  Constitutional:  Positive for fatigue. Negative for chills and fever.  HENT:  Negative for congestion.   Eyes:  Negative for visual disturbance.  Respiratory:  Negative for shortness of breath.   Cardiovascular:   Negative for chest pain.  Gastrointestinal:  Positive for abdominal pain. Negative  for diarrhea and vomiting.  Genitourinary:  Positive for difficulty urinating, flank pain and hematuria. Negative for dysuria.  Musculoskeletal:  Positive for back pain.  Skin:  Negative for rash.  Neurological:  Negative for headaches.   Physical Exam Updated Vital Signs BP (!) 95/57 Comment: RN aware  Pulse 87 Comment: RN aware  Temp 97.6 F (36.4 C) (Oral)   Resp 20 Comment: RN aware  Ht 5\' 8"  (1.727 m)   Wt 68.5 kg   SpO2 99% Comment: RN aware  BMI 22.96 kg/m   Physical Exam Vitals and nursing note reviewed.  Constitutional:      Appearance: Normal appearance.  HENT:     Head: Normocephalic.     Mouth/Throat:     Mouth: Mucous membranes are moist.  Cardiovascular:     Rate and Rhythm: Normal rate.  Pulmonary:     Effort: Pulmonary effort is normal. No respiratory distress.  Abdominal:     Palpations: Abdomen is soft.     Tenderness: There is no abdominal tenderness. There is no guarding.  Genitourinary:    Comments: Foley in place, draining red urine with clots Skin:    General: Skin is warm.  Neurological:     Mental Status: He is alert and oriented to person, place, and time. Mental status is at baseline.  Psychiatric:        Mood and Affect: Mood normal.    ED Results / Procedures / Treatments   Labs (all labs ordered are listed, but only abnormal results are displayed) Labs Reviewed  CBC WITH DIFFERENTIAL/PLATELET - Abnormal; Notable for the following components:      Result Value   WBC 13.8 (*)    RBC 2.96 (*)    Hemoglobin 9.3 (*)    HCT 27.2 (*)    Neutro Abs 11.3 (*)    Monocytes Absolute 1.1 (*)    All other components within normal limits  COMPREHENSIVE METABOLIC PANEL - Abnormal; Notable for the following components:   Glucose, Bld 240 (*)    BUN 32 (*)    Creatinine, Ser 1.72 (*)    Calcium 8.4 (*)    Total Protein 5.6 (*)    Albumin 3.4 (*)    GFR,  Estimated 43 (*)    All other components within normal limits  TYPE AND SCREEN    EKG None  Radiology No results found.  Procedures Procedures   Medications Ordered in ED Medications  HYDROmorphone (DILAUDID) injection 0.5 mg (has no administration in time range)  belladonna-opium (B&O) suppository 16.2-60mg  (has no administration in time range)  ciprofloxacin (CIPRO) tablet 500 mg (has no administration in time range)  lactated ringers infusion (has no administration in time range)  sodium chloride 0.9 % bolus 1,000 mL (1,000 mLs Intravenous New Bag/Given 02/21/21 2505)    ED Course  I have reviewed the triage vital signs and the nursing notes.  Pertinent labs & imaging results that were available during my care of the patient were reviewed by me and considered in my medical decision making (see chart for details).    MDM Rules/Calculators/A&P                           65 year old male presents emergency department for urologic consultation.  Patient recently had a TURP procedure, presented with hematuria.  Foley was placed with irrigation of red blood and clots.  Mild anemia at outside facility and CAT scan shows large clot burden  in the bladder with possible bladder rupture although felt less likely.  On arrival patient is hypotensive, fluid responsive.  Repeat blood work shows downtrending hemoglobin at 9.3.  Type and screen is pending.  Mild AKI.  Urology Dr. Alinda Money has evaluated the patient.  Recommends continuous irrigation, hold aspirin/Plavix.  We will admit the patient for further monitoring, no plan for acute surgical intervention at this time.  Patients evaluation and results requires admission for further treatment and care. Patient agrees with admission plan, offers no new complaints and is stable/unchanged at time of admit.  Final Clinical Impression(s) / ED Diagnoses Final diagnoses:  Other microscopic hematuria    Rx / DC Orders ED Discharge Orders      None        Lorelle Gibbs, DO 02/21/21 0158

## 2021-02-21 NOTE — H&P (Signed)
History of present illness: 65 year old male with a history of high-grade bladder cancer who was taken to the operating room yesterday for restaging TURBT.  Following the procedure he was sent home with a Foley catheter.  Once he got home he did take his home meds including his Plavix.  He then presented to our clinic with gross hematuria and bladder spasms.  We placed a 24 French Foley catheter and irrigated his catheter.  He did have gross hematuria, but it seemed to be draining fairly well.  He was discharged home.  He subsequently represented to the [facility] emergency department with pain.  He was treated with Levsin sublingual medication for his bladder spasms and Dilaudid.  He was gently irrigated.  He had a CT scan done in the emergency department that demonstrated some small extraperitoneal fluid.  He was subsequently admitted and transferred to [facility] for further evaluation.  At the time of my evaluation the patient was noted to have gross hematuria, I irrigated the Foley and some clots were removed, but there did not appear to be a large amount of clot.  He was complaining of intermittent pain, but was comfortable.  His blood pressure was mildly low.  Review of systems: A 12 point comprehensive review of systems was obtained and is negative unless otherwise stated in the history of present illness.  Patient Active Problem List   Diagnosis Date Noted   Hematuria 02/21/2021   Presence of drug coated stent in LAD coronary artery 03/18/2019   Diabetes mellitus ([place]) 02/12/2016   Right carotid bruit 02/07/2016   Abnormal EKG- "inferior, laterl infarct" is chronic 02/24/2013   Diabetes mellitus type 2 in nonobese ([place]) 02/24/2013   Overweight (BMI 25.0-29.9)  08/27/2012   CAD - s/p CABG x 17 Mar 2010. Abnormal Myoview-LAD & D1 disease 06/2011 Rx'd with Promus DES 03/15/2010    Class: History of   Essential hypertension 03/15/2010    Class: History of   Hyperlipidemia associated with  type 2 diabetes mellitus ([place]) 03/15/2010    Class: Chronic   Tobacco dependence 03/15/2010   NSTEMI (non-ST elevated myocardial infarction) ([place]) 03/15/2010    Class: History of   S/P CABG x 3 03/15/2010   S/P MVR (mitral valve repair) 03/15/2010   Ischemic Cardiomyopathy - EF ~35 -40%; Class I-II CHF 03/15/2000    Class: Chronic    No current facility-administered medications on file prior to encounter.   Current Outpatient Medications on File Prior to Encounter  Medication Sig Dispense Refill   Ascorbic Acid (VITAMIN C) 1000 MG tablet Take 1,000 mg by mouth in the morning.     aspirin EC 81 MG tablet Take 81 mg by mouth at bedtime.     B Complex-C (SUPER B COMPLEX PO) Take 1 capsule by mouth daily.     carvedilol (COREG) 12.5 MG tablet TAKE 1/2 TABLET BY MOUTH EVERY MORNING AND 1 TABLET AT NIGHT (Patient taking differently: Take 6.25-12.5 mg by mouth See admin instructions. Taking 6.25 (1/2) tablet in the AM and 12.5 mg ( 1 tab) in the evening) 180 tablet 3   Cholecalciferol (VITAMIN D-3) 125 MCG (5000 UT) TABS Take 5,000 Units by mouth in the morning.     clopidogrel (PLAVIX) 75 MG tablet TAKE 1 TABLET(75 MG) BY MOUTH DAILY (Patient taking differently: Take 75 mg by mouth daily.) 90 tablet 0   Echinacea 400 MG CAPS Take 400 mg by mouth in the morning.     Garlic (GARLIQUE PO) Take  1 tablet by mouth in the morning.     glimepiride (AMARYL) 4 MG tablet Take 4 mg by mouth 2 (two) times daily.     ibuprofen (ADVIL) 200 MG tablet Take 200-400 mg by mouth every 6 (six) hours as needed (pain.).     JARDIANCE 25 MG TABS tablet Take 25 mg by mouth in the morning.     Magnesium 500 MG TABS Take 500 mg by mouth in the morning.     Multiple Vitamins-Minerals (MULTIVITAMINS THER. W/MINERALS) TABS Take 1 tablet by mouth daily.     Omega-3 Fatty Acids (FISH OIL) 1000 MG CAPS Take 1,000 mg by mouth in the morning.     rosuvastatin (CRESTOR) 40 MG tablet Take 1 tablet (40 mg total) by mouth daily.  (Patient taking differently: Take 40 mg by mouth every evening.) 90 tablet 3   traMADol (ULTRAM) 50 MG tablet Take 1 tablet (50 mg total) by mouth every 6 (six) hours as needed. (Patient taking differently: Take 50 mg by mouth every 6 (six) hours as needed for moderate pain.) 20 tablet 0   valsartan (DIOVAN) 80 MG tablet TAKE 1 TABLET(80 MG) BY MOUTH DAILY (Patient taking differently: Take 80 mg by mouth daily.) 90 tablet 3   vitamin E 1000 UNIT capsule Take 1,000 Units by mouth daily.     Zinc 50 MG TABS Take 50 mg by mouth in the morning.     nitrofurantoin, macrocrystal-monohydrate, (MACROBID) 100 MG capsule Take 100 mg by mouth 2 (two) times daily.      Past Medical History:  Diagnosis Date   CAD S/P percutaneous coronary angioplasty 03/2010 -- 3-07/2011   Proximal LAD ~80% (into D2) & mid - subtotal occlusion , RCA occlusion, LCx occlusion -- s/p CABG x 3  (LIMA-LAD, SVG-OM, SVG-RPDA); Cath 06/2011, patent grafts with severe proximal  LAD - D1 lesions -- 07/2011: PCI  proxLAD-into D2 with Promus Premier DES 2.5 mm x 38 mm (distal in D2 2.5 mm, @ bifurcation - 2.75 m, in prox LAD ~3.0 mm   Carotid artery disease (HCC)    moderate by 2017 duplex US   Chronic combined systolic and diastolic HF (heart failure), NYHA class 2 ([place]) 03/2010; 08/2103   a) EF ~30-30%; Exertional Dyspnea; No exaccerbations.;; b)EF 35-40%, Mod LA dilation   DM (diabetes mellitus) type II controlled peripheral vascular disorder 04/16/2011   & CAD   Heart murmur    History of kidney stones    HTN, goal below 130/80 03/15/2010   Hyperlipidemia LDL goal < 70    Ischemic cardiomyopathy 03/2010; May 2015   a) Echo - EF 30-40%; mild/mod anterior wall hypokinesis; doppler flow suggestive of impaired LV relaxation; LA moderately dilated; annuloplasty ring noted in mitral position; mild/mod mitral regurgitation; RVsystolic pressure elevated at 30-73mmHg;;; b) Echo May 2015: EF 35-40%   NSTEMI (non-ST elevated myocardial  infarction) ([place]) 63/33/5456   Complicated by Severe Acute Systolic CHF -- Class IV CHF,   S/P CABG x 3 03/15/2010   LIMA-mLAD, SVG-OM, SVG-rPDA   S/P MVR (mitral valve repair) 03/15/2010   @ time of CABG for ischemic MR - 26 mm Edwards physio-2 annuloplasty ring   Tobacco dependence    Initially quit - restarted ~mid 2012; now essentially quit.    Past Surgical History:  Procedure Laterality Date   CORONARY ARTERY BYPASS GRAFT  03/2010   LIMA-LAD, SVG-OM, SVG-RPDA   Left Arm Surgery - NOS     LEFT HEART CATHETERIZATION WITH CORONARY/GRAFT ANGIOGRAM  07/03/2011   Procedure: LEFT HEART CATHETERIZATION WITH Beatrix Fetters;  Surgeon: Leonie Man, MD;  Location[facility] CATH LAB;  Service: Cardiovascular;Patent LIMA-LAD, SVG-OM, SVG-RPDA; (native RCA & OM prox 100%, prox LAD into major D2 ~80% long lesion, midLAD 100%   MITRAL VALVULOPLASTY  03/2010   conjuntive with CABG for Severe MR; 26 mm Edwards physio-2 annuloplasty ring   NM MYOVIEW LTD  04/24/2011   R/L MV - EF 37%; mild perfusion defect due to attenuation w/mild to mod superimposed ischemia seen in apex, apical lateral and distal to mid ateroseptal region; LV systolic fcn moderately reduced; hypotensive BP response to stress w/o chest pain; high risk scan   PERCUTANEOUS CORONARY STENT INTERVENTION (PCI-S) N/A 08/02/2011   Procedure: PERCUTANEOUS CORONARY STENT INTERVENTION (PCI-S);  Surgeon: Leonie Man, MD;  Location: [facility] CATH LAB;  Service: Cardiovascular;  PCI proximal LAD-D2: Promus Premier DES 2.5 x 38 (tapered post-dilation from 3.0 mm in proximal LAD, 2.75 mm at bifurcation and 2.5 mm in D2)   TONSILLECTOMY     Childhood   TRANSTHORACIC ECHOCARDIOGRAM  03/23/2010    Pre-CABG/MVR: EF 25-30%; global HK; Mod-Severe MR   TRANSTHORACIC ECHOCARDIOGRAM  09/2011; 08/2013   a) EF up to 30-40% (post PCI); mild-mod Ant wall HK; Gd 1 DD; MV Annuloplasty ring in place, mild-mod MR; mild PHTN (30-40 mmHg);; b)  EF 35-40% with  moderately reduced function. Moderately dilated LV with distal septal-apical, mid and basal inferior akinesis; status post mitral valve ring. Significant diastolic gradient with mild MR. Moderate LA dilation.     TRANSURETHRAL RESECTION OF BLADDER TUMOR N/A 01/30/2021   Procedure: RESTAGING TRANSURETHRAL RESECTION OF BLADDER TUMOR (TURBT);  Surgeon: Robley Fries, MD;  Location: [facility] ORS;  Service: Urology;  Laterality: N/A;  1 HR   TRANSURETHRAL RESECTION OF BLADDER TUMOR N/A 02/20/2021   Procedure: TRANSURETHRAL RESECTION OF BLADDER TUMOR (TURBT), RESTAGING;  Surgeon: Robley Fries, MD;  Location: [facility] ORS;  Service: Urology;  Laterality: N/A;  GENERAL ANESTHESIA WITH PARALYSIS   TRANSURETHRAL RESECTION OF BLADDER TUMOR WITH MITOMYCIN-C N/A 12/26/2020   Procedure: TRANSURETHRAL RESECTION OF BLADDER TUMOR WITH GEMCITABINE;  Surgeon: Robley Fries, MD;  Location: [facility] ORS;  Service: Urology;  Laterality: N/A;    Social History   Tobacco Use   Smoking status: Former    Packs/day: 0.00    Years: 50.00    Pack years: 0.00    Types: Cigarettes   Smokeless tobacco: Former    Types: Chew   Tobacco comments:    Quit in 2011 but restarted mid 2012,uses E-cigarettes; Not actually smoking, but does have 2nd hand exposure.  Vaping Use   Vaping Use: Former   Start date: 02/28/2016  Substance Use Topics   Alcohol use: Yes    Alcohol/week: 3.0 standard drinks    Types: 3 Standard drinks or equivalent per week    Comment: 3-6 Beers per week   Drug use: No    Family History  Problem Relation Age of Onset   Heart failure Father 77       History of MI & severe CAD; recently deceased.   Lung cancer Mother    Cancer Maternal Grandmother    Stroke Maternal Grandfather    Diabetes Paternal Grandmother    Diabetes Paternal Grandfather    Stroke Paternal Grandfather    Kidney failure Brother        deceased at 45 (was born premature)    PE: Vitals:   02/21/21 1137 02/21/21 1300  02/21/21  1330 02/21/21 1430  BP: (!) 91/57 (!) 87/49 (!) 100/55 (!) 106/53  Pulse: 91 86 83 93  Resp: 17 14 13 16   Temp:      TempSrc:      SpO2: 96% 94% 95% 97%  Weight:      Height:       Patient appears to be in no acute distress  patient is alert and oriented x3 Atraumatic normocephalic head No cervical or supraclavicular lymphadenopathy appreciated No increased work of breathing, no audible wheezes/rhonchi Regular sinus rhythm/rate Abdomen is soft, nontender, nondistended, no CVA or suprapubic tenderness Foley draining gross hematuria with some clots in the tubing. Lower extremities are symmetric without appreciable edema Grossly neurologically intact No identifiable skin lesions  Recent Labs    02/21/21 0830  WBC 13.8*  HGB 9.3*  HCT 27.2*   Recent Labs    02/21/21 0830  NA 137  K 4.2  CL 109  CO2 23  GLUCOSE 240*  BUN 32*  CREATININE 1.72*  CALCIUM 8.4*   No results for input(s): LABPT, INR in the last 72 hours. No results for input(s): LABURIN in the last 72 hours. Results for orders placed or performed during the hospital encounter of 03/15/10  MRSA PCR Screening     Status: None   Collection Time: 03/15/10  6:18 PM  Result Value Ref Range Status   MRSA by PCR  NEGATIVE Final    NEGATIVE        The GeneXpert MRSA Assay (FDA approved for NASAL specimens only), is one component of a comprehensive MRSA colonization surveillance program. It is not intended to diagnose MRSA infection nor to guide or monitor treatment for MRSA infections.  Culture, sputum-assessment     Status: None   Collection Time: 03/25/10 11:59 AM   Specimen: SPU  Result Value Ref Range Status   Specimen Description SPUTUM  Final   Special Requests NONE  Final   Sputum evaluation   Final    THIS SPECIMEN IS ACCEPTABLE. RESPIRATORY CULTURE REPORT TO FOLLOW.   Report Status 03/25/2010 FINAL  Final  Culture, respiratory     Status: None   Collection Time: 03/25/10 11:59 AM   Specimen:  SPU  Result Value Ref Range Status   Specimen Description SPUTUM  Final   Special Requests NONE  Final   Gram Stain   Final    MODERATE WBC PRESENT, PREDOMINANTLY PMN FEW SQUAMOUS EPITHELIAL CELLS PRESENT MODERATE YEAST WITH PSEUDOHYPHAE   Culture MODERATE CANDIDA ALBICANS  Final   Report Status 03/27/2010 FINAL  Final    Imaging: none I reviewed the CT scan done in the emergency department demonstrating some fluid around the bladder consistent with a small micro bladder perforation, extraperitoneal.  Imp: Patient admitted for pain control, gross hematuria, and low blood pressure.  Unfortunately, the patient started his Plavix too early, and has now developed significant hematuria.  It appears to be clearing.  We have not placed him on continuous bladder irrigation because of his recent bladder tumor resection.  56 French Foley catheter appears to be draining well and functioning appropriately.  His urine is starting to clear.  His bladder spasms are much better controlled now with BNO suppositories.  His blood pressure has responded well to IV fluid.  Recommendations: Admit for observation.  Trend hemoglobin.  Manage pain with BNO suppositories, Tylenol, and IV Dilaudid as needed.  The patient should be able to eat, there is no indication for any additional surgeries during  his current admission.  Started the patient on Cipro empirically.  We will keep the patient on telemetry given his hypertension.   Ardis Hughs

## 2021-02-22 ENCOUNTER — Observation Stay (HOSPITAL_COMMUNITY): Payer: Medicare Other | Admitting: Anesthesiology

## 2021-02-22 ENCOUNTER — Encounter (HOSPITAL_COMMUNITY): Payer: Self-pay | Admitting: Urology

## 2021-02-22 ENCOUNTER — Encounter (HOSPITAL_COMMUNITY)
Admission: EM | Disposition: A | Discharge status: Home / Self Care | Payer: Self-pay | Source: Home / Self Care | Attending: Urology

## 2021-02-22 DIAGNOSIS — Z955 Presence of coronary angioplasty implant and graft: Secondary | ICD-10-CM | POA: Diagnosis not present

## 2021-02-22 DIAGNOSIS — Z7902 Long term (current) use of antithrombotics/antiplatelets: Secondary | ICD-10-CM | POA: Diagnosis not present

## 2021-02-22 DIAGNOSIS — Z8249 Family history of ischemic heart disease and other diseases of the circulatory system: Secondary | ICD-10-CM | POA: Diagnosis not present

## 2021-02-22 DIAGNOSIS — Z951 Presence of aortocoronary bypass graft: Secondary | ICD-10-CM | POA: Diagnosis not present

## 2021-02-22 DIAGNOSIS — Z79899 Other long term (current) drug therapy: Secondary | ICD-10-CM | POA: Diagnosis not present

## 2021-02-22 DIAGNOSIS — Z833 Family history of diabetes mellitus: Secondary | ICD-10-CM | POA: Diagnosis not present

## 2021-02-22 DIAGNOSIS — Z87891 Personal history of nicotine dependence: Secondary | ICD-10-CM | POA: Diagnosis not present

## 2021-02-22 DIAGNOSIS — E1151 Type 2 diabetes mellitus with diabetic peripheral angiopathy without gangrene: Secondary | ICD-10-CM | POA: Diagnosis not present

## 2021-02-22 DIAGNOSIS — E1169 Type 2 diabetes mellitus with other specified complication: Secondary | ICD-10-CM | POA: Diagnosis not present

## 2021-02-22 DIAGNOSIS — Z87442 Personal history of urinary calculi: Secondary | ICD-10-CM | POA: Diagnosis not present

## 2021-02-22 DIAGNOSIS — R310 Gross hematuria: Secondary | ICD-10-CM | POA: Diagnosis not present

## 2021-02-22 DIAGNOSIS — N401 Enlarged prostate with lower urinary tract symptoms: Secondary | ICD-10-CM | POA: Diagnosis not present

## 2021-02-22 DIAGNOSIS — Z841 Family history of disorders of kidney and ureter: Secondary | ICD-10-CM | POA: Diagnosis not present

## 2021-02-22 DIAGNOSIS — I10 Essential (primary) hypertension: Secondary | ICD-10-CM | POA: Diagnosis not present

## 2021-02-22 DIAGNOSIS — Z952 Presence of prosthetic heart valve: Secondary | ICD-10-CM | POA: Diagnosis not present

## 2021-02-22 DIAGNOSIS — Z885 Allergy status to narcotic agent status: Secondary | ICD-10-CM | POA: Diagnosis not present

## 2021-02-22 DIAGNOSIS — Z823 Family history of stroke: Secondary | ICD-10-CM | POA: Diagnosis not present

## 2021-02-22 DIAGNOSIS — R319 Hematuria, unspecified: Secondary | ICD-10-CM | POA: Diagnosis present

## 2021-02-22 DIAGNOSIS — R338 Other retention of urine: Secondary | ICD-10-CM | POA: Diagnosis not present

## 2021-02-22 DIAGNOSIS — Z7984 Long term (current) use of oral hypoglycemic drugs: Secondary | ICD-10-CM | POA: Diagnosis not present

## 2021-02-22 DIAGNOSIS — E785 Hyperlipidemia, unspecified: Secondary | ICD-10-CM | POA: Diagnosis present

## 2021-02-22 DIAGNOSIS — I252 Old myocardial infarction: Secondary | ICD-10-CM | POA: Diagnosis not present

## 2021-02-22 DIAGNOSIS — C672 Malignant neoplasm of lateral wall of bladder: Secondary | ICD-10-CM | POA: Diagnosis not present

## 2021-02-22 DIAGNOSIS — Z7982 Long term (current) use of aspirin: Secondary | ICD-10-CM | POA: Diagnosis not present

## 2021-02-22 DIAGNOSIS — Z792 Long term (current) use of antibiotics: Secondary | ICD-10-CM | POA: Diagnosis not present

## 2021-02-22 HISTORY — PX: FULGURATION OF BLADDER TUMOR: SHX6261

## 2021-02-22 LAB — GLUCOSE, CAPILLARY
Glucose-Capillary: 118 mg/dL — ABNORMAL HIGH (ref 70–99)
Glucose-Capillary: 134 mg/dL — ABNORMAL HIGH (ref 70–99)

## 2021-02-22 SURGERY — FULGURATION, NEOPLASM, BLADDER
Anesthesia: General

## 2021-02-22 MED ORDER — HYDROMORPHONE HCL 1 MG/ML IJ SOLN
1.0000 mg | INTRAMUSCULAR | Status: DC | PRN
  Administered 2021-02-22: 1 mg via INTRAVENOUS
  Filled 2021-02-22: qty 1

## 2021-02-22 MED ORDER — EMPAGLIFLOZIN 25 MG PO TABS
25.0000 mg | ORAL_TABLET | Freq: Every morning | ORAL | Status: DC
  Administered 2021-02-23: 25 mg via ORAL
  Filled 2021-02-22: qty 1

## 2021-02-22 MED ORDER — ONDANSETRON HCL 4 MG/2ML IJ SOLN
INTRAMUSCULAR | Status: AC
  Filled 2021-02-22: qty 2

## 2021-02-22 MED ORDER — ROSUVASTATIN CALCIUM 20 MG PO TABS
40.0000 mg | ORAL_TABLET | Freq: Every evening | ORAL | Status: DC
  Administered 2021-02-22: 40 mg via ORAL
  Filled 2021-02-22: qty 2

## 2021-02-22 MED ORDER — CARVEDILOL 12.5 MG PO TABS
12.5000 mg | ORAL_TABLET | Freq: Every day | ORAL | Status: DC
  Administered 2021-02-22: 12.5 mg via ORAL
  Filled 2021-02-22: qty 1

## 2021-02-22 MED ORDER — ONDANSETRON HCL 4 MG/2ML IJ SOLN
INTRAMUSCULAR | Status: DC | PRN
  Administered 2021-02-22: 4 mg via INTRAVENOUS

## 2021-02-22 MED ORDER — ROCURONIUM BROMIDE 10 MG/ML (PF) SYRINGE
PREFILLED_SYRINGE | INTRAVENOUS | Status: DC | PRN
  Administered 2021-02-22: 60 mg via INTRAVENOUS
  Administered 2021-02-22: 10 mg via INTRAVENOUS

## 2021-02-22 MED ORDER — CARVEDILOL 6.25 MG PO TABS: 6.2500 mg | ORAL_TABLET | ORAL | Status: DC

## 2021-02-22 MED ORDER — FENTANYL CITRATE (PF) 100 MCG/2ML IJ SOLN
INTRAMUSCULAR | Status: AC
  Filled 2021-02-22: qty 2

## 2021-02-22 MED ORDER — DEXAMETHASONE SODIUM PHOSPHATE 10 MG/ML IJ SOLN
INTRAMUSCULAR | Status: AC
  Filled 2021-02-22: qty 1

## 2021-02-22 MED ORDER — GLIMEPIRIDE 4 MG PO TABS
4.0000 mg | ORAL_TABLET | Freq: Two times a day (BID) | ORAL | Status: DC
  Administered 2021-02-23: 4 mg via ORAL
  Filled 2021-02-22: qty 1

## 2021-02-22 MED ORDER — HYDROMORPHONE HCL 1 MG/ML IJ SOLN
INTRAMUSCULAR | Status: AC
  Filled 2021-02-22: qty 1

## 2021-02-22 MED ORDER — ONDANSETRON HCL 4 MG/2ML IJ SOLN: 4.0000 mg | Freq: Once | INTRAMUSCULAR | Status: DC | PRN

## 2021-02-22 MED ORDER — DEXAMETHASONE SODIUM PHOSPHATE 10 MG/ML IJ SOLN
INTRAMUSCULAR | Status: DC | PRN
  Administered 2021-02-22: 5 mg via INTRAVENOUS

## 2021-02-22 MED ORDER — ROCURONIUM BROMIDE 10 MG/ML (PF) SYRINGE
PREFILLED_SYRINGE | INTRAVENOUS | Status: AC
  Filled 2021-02-22: qty 10

## 2021-02-22 MED ORDER — EPHEDRINE 5 MG/ML INJ
INTRAVENOUS | Status: AC
  Filled 2021-02-22: qty 5

## 2021-02-22 MED ORDER — LACTATED RINGERS IV SOLN: INTRAVENOUS | Status: DC

## 2021-02-22 MED ORDER — HYDROMORPHONE HCL 1 MG/ML IJ SOLN
0.2500 mg | INTRAMUSCULAR | Status: DC | PRN
  Administered 2021-02-22 (×2): 0.5 mg via INTRAVENOUS

## 2021-02-22 MED ORDER — LIDOCAINE HCL (PF) 2 % IJ SOLN
INTRAMUSCULAR | Status: AC
  Filled 2021-02-22: qty 5

## 2021-02-22 MED ORDER — CHLORHEXIDINE GLUCONATE 0.12 % MT SOLN
15.0000 mL | Freq: Once | OROMUCOSAL | Status: AC
  Administered 2021-02-22: 15 mL via OROMUCOSAL

## 2021-02-22 MED ORDER — ACETAMINOPHEN 10 MG/ML IV SOLN
INTRAVENOUS | Status: AC
  Filled 2021-02-22: qty 100

## 2021-02-22 MED ORDER — PHENYLEPHRINE HCL-NACL 20-0.9 MG/250ML-% IV SOLN
INTRAVENOUS | Status: DC | PRN
  Administered 2021-02-22: 10 ug/min via INTRAVENOUS

## 2021-02-22 MED ORDER — CARVEDILOL 6.25 MG PO TABS
6.2500 mg | ORAL_TABLET | Freq: Every day | ORAL | Status: DC
  Administered 2021-02-23: 6.25 mg via ORAL
  Filled 2021-02-22: qty 1

## 2021-02-22 MED ORDER — CHLORHEXIDINE GLUCONATE CLOTH 2 % EX PADS: 6.0000 | MEDICATED_PAD | Freq: Every day | CUTANEOUS | Status: DC

## 2021-02-22 MED ORDER — CHLORHEXIDINE GLUCONATE CLOTH 2 % EX PADS
6.0000 | MEDICATED_PAD | Freq: Once | CUTANEOUS | Status: AC
  Administered 2021-02-22: 6 via TOPICAL

## 2021-02-22 MED ORDER — CHLORHEXIDINE GLUCONATE CLOTH 2 % EX PADS
6.0000 | MEDICATED_PAD | Freq: Every day | CUTANEOUS | Status: DC
  Administered 2021-02-23: 6 via TOPICAL

## 2021-02-22 MED ORDER — IRBESARTAN 75 MG PO TABS
75.0000 mg | ORAL_TABLET | Freq: Every day | ORAL | Status: DC
  Administered 2021-02-23: 75 mg via ORAL
  Filled 2021-02-22: qty 1

## 2021-02-22 MED ORDER — OXYBUTYNIN CHLORIDE 5 MG PO TABS
5.0000 mg | ORAL_TABLET | Freq: Three times a day (TID) | ORAL | Status: DC | PRN
  Administered 2021-02-23: 5 mg via ORAL
  Filled 2021-02-22: qty 1

## 2021-02-22 MED ORDER — MIDAZOLAM HCL 5 MG/5ML IJ SOLN
INTRAMUSCULAR | Status: DC | PRN
  Administered 2021-02-22: 1 mg via INTRAVENOUS

## 2021-02-22 MED ORDER — 0.9 % SODIUM CHLORIDE (POUR BTL) OPTIME
TOPICAL | Status: DC | PRN
  Administered 2021-02-22: 1000 mL

## 2021-02-22 MED ORDER — LIDOCAINE 2% (20 MG/ML) 5 ML SYRINGE
INTRAMUSCULAR | Status: DC | PRN
  Administered 2021-02-22: 100 mg via INTRAVENOUS

## 2021-02-22 MED ORDER — PROPOFOL 10 MG/ML IV BOLUS
INTRAVENOUS | Status: DC | PRN
  Administered 2021-02-22: 100 mg via INTRAVENOUS

## 2021-02-22 MED ORDER — ACETAMINOPHEN 10 MG/ML IV SOLN
1000.0000 mg | Freq: Once | INTRAVENOUS | Status: DC | PRN
  Administered 2021-02-22: 1000 mg via INTRAVENOUS

## 2021-02-22 MED ORDER — SODIUM CHLORIDE 0.9 % IR SOLN
Status: DC | PRN
  Administered 2021-02-22: 24000 mL via INTRAVESICAL

## 2021-02-22 MED ORDER — FENTANYL CITRATE (PF) 100 MCG/2ML IJ SOLN
INTRAMUSCULAR | Status: DC | PRN
  Administered 2021-02-22 (×2): 25 ug via INTRAVENOUS
  Administered 2021-02-22 (×2): 50 ug via INTRAVENOUS
  Administered 2021-02-22: 25 ug via INTRAVENOUS

## 2021-02-22 MED ORDER — SUGAMMADEX SODIUM 200 MG/2ML IV SOLN
INTRAVENOUS | Status: DC | PRN
  Administered 2021-02-22: 200 mg via INTRAVENOUS

## 2021-02-22 MED ORDER — ASCORBIC ACID 500 MG PO TABS
1000.0000 mg | ORAL_TABLET | Freq: Every morning | ORAL | Status: DC
  Administered 2021-02-23: 1000 mg via ORAL
  Filled 2021-02-22: qty 2

## 2021-02-22 MED ORDER — TRAMADOL HCL 50 MG PO TABS
50.0000 mg | ORAL_TABLET | Freq: Four times a day (QID) | ORAL | Status: DC | PRN
  Administered 2021-02-23: 50 mg via ORAL
  Filled 2021-02-22: qty 1

## 2021-02-22 MED ORDER — PROPOFOL 10 MG/ML IV BOLUS
INTRAVENOUS | Status: AC
  Filled 2021-02-22: qty 20

## 2021-02-22 MED ORDER — MIDAZOLAM HCL 2 MG/2ML IJ SOLN
INTRAMUSCULAR | Status: AC
  Filled 2021-02-22: qty 2

## 2021-02-22 MED ORDER — VITAMIN D3 25 MCG (1000 UNIT) PO TABS
5000.0000 [IU] | ORAL_TABLET | Freq: Every morning | ORAL | Status: DC
  Administered 2021-02-23: 5000 [IU] via ORAL
  Filled 2021-02-22: qty 5

## 2021-02-22 SURGICAL SUPPLY — 17 items
BAG DRN RND TRDRP ANRFLXCHMBR (UROLOGICAL SUPPLIES)
BAG URINE DRAIN 2000ML AR STRL (UROLOGICAL SUPPLIES) IMPLANT
BAG URO CATCHER STRL LF (MISCELLANEOUS) ×2 IMPLANT
CATH 2WAY 30CC 24FR (CATHETERS) ×1 IMPLANT
CLOTH BEACON ORANGE TIMEOUT ST (SAFETY) ×2 IMPLANT
DRAPE FOOT SWITCH (DRAPES) ×2 IMPLANT
ELECT REM PT RETURN 15FT ADLT (MISCELLANEOUS) ×2 IMPLANT
GLOVE SURG ENC MOIS LTX SZ6.5 (GLOVE) ×2 IMPLANT
GOWN STRL REUS W/TWL LRG LVL3 (GOWN DISPOSABLE) ×2 IMPLANT
KIT TURNOVER KIT A (KITS) IMPLANT
LOOP CUT BIPOLAR 24F LRG (ELECTROSURGICAL) IMPLANT
MANIFOLD NEPTUNE II (INSTRUMENTS) ×2 IMPLANT
PACK CYSTO (CUSTOM PROCEDURE TRAY) ×2 IMPLANT
SYR TOOMEY IRRIG 70ML (MISCELLANEOUS)
SYRINGE TOOMEY IRRIG 70ML (MISCELLANEOUS) IMPLANT
TUBING CONNECTING 10 (TUBING) ×2 IMPLANT
TUBING UROLOGY SET (TUBING) ×2 IMPLANT

## 2021-02-22 NOTE — Op Note (Signed)
Operative Note  Preoperative diagnosis:  1.  Gross hematuria, clot urinary retention, bladder cancer  Postoperative diagnosis: 1.  same  Procedure(s): 1.  Cystoscopy, clot evacuation, fulguration  Surgeon: Jacalyn Lefevre, MD  Assistants:  None  Anesthesia:  General  Complications:  None  EBL:  200 cc old clot  Specimens: 1. none  Drains/Catheters: 1.  24Fr 2-way foley to gravity drainage  Intraoperative findings:   Normal urethra Lateral lobe prostatic hypertrophy Bilateral uninvolved ureteral orifices in orthotopic position Large clot burden in bladder obscuring view After clot removed, prior resection site with shaggy, necrotic edges and general oozing, no active arterial bleed seen Edges and any area that appeared oozying was fulgurated  Indication:  Eric Bradshaw is a 65 y.o. male with hx of high trade T1 bladder cancer who underwent TURBT for third time two days ago.  He took plavix on POD0 and then presented to ED with clot urinary retention.   Description of procedure:  After risks, benefits and alternatives were discussed with the patient, informed consent was obtained.  Patient was taken to OR and placed in supine position.  Antibiotics were administered and anesthesia was induced.  The patient was prepped and draped in usual sterile fashion.  A time out was performed.  The resectoscope using the visual obturator was placed in the urethral meatus.  It was advanced into the bladder under direct visualization.  Visualization was obscured due to significant clot burden.  The visual obturator was removed and a [place] syringe was connected to the resectoscope sheath.  Clot evacuation then took place.  Significant clot burden was removed.  After no more clot was seen in the [place] was removed.  The resectoscope was then assembled  The prior resection site was noted to be shaggy with friable/necrotic mucosa.  The bipolar loop was then used to cauterize any  area that looked erythematous.  There is no active arterial bleed seen.  The ureteral orifices were visualized and not involved.  After fulguration the irrigant was turned off and hemostasis was deemed adequate.  The resectoscope was removed and a 24 [place] two-way Foley catheter with 15 cc sterile water was used to inflate the balloon.  The patient was awakened from anesthesia and transferred the PACU in stable condition.  Plan:  Keep pt overnight and repeat Hgb in AM. Continue foley for several days.

## 2021-02-22 NOTE — Interval H&P Note (Signed)
History and Physical Interval Note: Hgb downtrending and urine dark red/thick.  Will plan on OR for cystoscopy, clot evacuation, and fulguration.  02/22/2021 7:55 AM  Eric Bradshaw  has presented today for surgery, with the diagnosis of GROSS HEMATURIUIA/URINARY RETENTION/BLADDER CANCER.  The various methods of treatment have been discussed with the patient and family. After consideration of risks, benefits and other options for treatment, the patient has consented to  Procedure(s): CYSTOSCOPY/CLOT EVACTION/FULGURATION OF BLADDER TUMOR (N/A) as a surgical intervention.  The patient's history has been reviewed, patient examined, no change in status, stable for surgery.  I have reviewed the patient's chart and labs.  Questions were answered to the patient's satisfaction.     Hussein Macdougal D Crystelle Ferrufino

## 2021-02-22 NOTE — Discharge Instructions (Addendum)
Transurethral Resection of Bladder Tumor (TURBT)   Definition:  Transurethral Resection of the Bladder Tumor is a surgical procedure used to diagnose and remove tumors within the bladder. TURBT is the most common treatment for early stage bladder cancer.  General instructions:     Your recent bladder surgery requires very little post hospital care but some definite precautions.  Despite the fact that no skin incisions were used, the area around the bladder incisions are raw and covered with scabs to promote healing and prevent bleeding. Certain precautions are needed to insure that the scabs are not disturbed over the next 2-4 weeks while the healing proceeds.  Because the raw surface inside your bladder and the irritating effects of urine you may expect frequency of urination and/or urgency (a stronger desire to urinate) and perhaps even getting up at night more often. This will usually resolve or improve slowly over the healing period. You may see some blood in your urine over the first 6 weeks. Do not be alarmed, even if the urine was clear for a while. Get off your feet and drink lots of fluids until clearing occurs. If you start to pass clots or don't improve call us.  Catheter: (If you are discharged with a catheter.)  1. Keep your catheter secured to your leg at all times with tape or the supplied strap. 2. You may experience leakage of urine around your catheter- as long as the  catheter continues to drain, this is normal.  If your catheter stops draining  go to the ER. 3. You may also have blood in your urine, even after it has been clear for  several days; you may even pass some small blood clots or other material.  This  is normal as well.  If this happens, sit down and drink plenty of water to help  make urine to flush out your bladder.  If the blood in your urine becomes worse  after doing this, contact our office or return to the ER. 4. You may use the leg bag (small bag)  during the day, but use the large bag at  night.  Diet:  You may return to your normal diet immediately. Because of the raw surface of your bladder, alcohol, spicy foods, foods high in acid and drinks with caffeine may cause irritation or frequency and should be used in moderation. To keep your urine flowing freely and avoid constipation, drink plenty of fluids during the day (8-10 glasses). Tip: Avoid cranberry juice because it is very acidic.  Activity:  Your physical activity doesn't need to be restricted. However, if you are very active, you may see some blood in the urine. We suggest that you reduce your activity under the circumstances until the bleeding has stopped.  Bowels:  It is important to keep your bowels regular during the postoperative period. Straining with bowel movements can cause bleeding. A bowel movement every other day is reasonable. Use a mild laxative if needed, such as milk of magnesia 2-3 tablespoons, or 2 Dulcolax tablets. Call if you continue to have problems. If you had been taking narcotics for pain, before, during or after your surgery, you may be constipated. Take a laxative if necessary.    Medication:  You should resume your pre-surgery medications unless told not to. In addition you may be given an antibiotic to prevent or treat infection. Antibiotics are not always necessary. All medication should be taken as prescribed until the bottles are finished unless you are having   an unusual reaction to one of the drugs.  **Hold aspirin and plavix until Monday, November 14    02/23/21 To Whom It May Concern,  Please excuse Eric Bradshaw from work as he is under my care.  He has undergone 4 surgeries in last several weeks for cancer treatment and required hospitalization.  He is not able to return to work until Monday, November 21.  Sincerely, Jacalyn Lefevre, MD [phone]

## 2021-02-22 NOTE — Anesthesia Preprocedure Evaluation (Signed)
Anesthesia Evaluation  Patient identified by MRN, date of birth, ID band Patient awake    Reviewed: Allergy & Precautions, NPO status , Patient's Chart, lab work & pertinent test results  Airway Mallampati: II  TM Distance: >3 FB Neck ROM: Full    Dental no notable dental hx.    Pulmonary neg pulmonary ROS, former smoker,    Pulmonary exam normal breath sounds clear to auscultation       Cardiovascular hypertension, + CAD, + Past MI, + CABG, + Peripheral Vascular Disease and +CHF  Normal cardiovascular exam Rhythm:Regular Rate:Normal + Systolic murmurs    Neuro/Psych negative neurological ROS  negative psych ROS   GI/Hepatic negative GI ROS, Neg liver ROS,   Endo/Other  negative endocrine ROSdiabetes  Renal/GU negative Renal ROS  negative genitourinary   Musculoskeletal negative musculoskeletal ROS (+)   Abdominal   Peds negative pediatric ROS (+)  Hematology negative hematology ROS (+)   Anesthesia Other Findings   Reproductive/Obstetrics negative OB ROS                             Anesthesia Physical Anesthesia Plan  ASA: 3  Anesthesia Plan: General   Post-op Pain Management:    Induction: Intravenous  PONV Risk Score and Plan: 2 and Ondansetron and Treatment may vary due to age or medical condition  Airway Management Planned: Oral ETT  Additional Equipment:   Intra-op Plan:   Post-operative Plan: Extubation in OR  Informed Consent: I have reviewed the patients History and Physical, chart, labs and discussed the procedure including the risks, benefits and alternatives for the proposed anesthesia with the patient or authorized representative who has indicated his/her understanding and acceptance.     Dental advisory given  Plan Discussed with: CRNA and Surgeon  Anesthesia Plan Comments:         Anesthesia Quick Evaluation

## 2021-02-22 NOTE — Anesthesia Procedure Notes (Addendum)
Procedure Name: Intubation Date/Time: 02/22/2021 1:15 PM Performed by: Victoriano Lain, CRNA Pre-anesthesia Checklist: Patient identified, Emergency Drugs available, Suction available, Patient being monitored and Timeout performed Patient Re-evaluated:Patient Re-evaluated prior to induction Oxygen Delivery Method: Circle system utilized Preoxygenation: Pre-oxygenation with 100% oxygen Induction Type: IV induction Ventilation: Mask ventilation without difficulty and Oral airway inserted - appropriate to patient size Laryngoscope Size: Mac and 4 Grade View: Grade I Tube type: Oral Tube size: 7.5 mm Number of attempts: 1 Airway Equipment and Method: Stylet Placement Confirmation: ETT inserted through vocal cords under direct vision, positive ETCO2 and breath sounds checked- equal and bilateral Secured at: 22 cm Tube secured with: Tape Dental Injury: Teeth and Oropharynx as per pre-operative assessment

## 2021-02-22 NOTE — Transfer of Care (Signed)
Immediate Anesthesia Transfer of Care Note  Patient: Eric Bradshaw  Procedure(s) Performed: CYSTOSCOPY/CLOT EVACTION/FULGURATION OF BLADDER TUMOR  Patient Location: PACU  Anesthesia Type:General  Level of Consciousness: awake, alert , oriented and patient cooperative  Airway & Oxygen Therapy: Patient Spontanous Breathing and Patient connected to face mask oxygen  Post-op Assessment: Report given to RN, Post -op Vital signs reviewed and stable and Patient moving all extremities  Post vital signs: Reviewed and stable  Last Vitals:  Vitals Value Taken Time  BP 155/78 02/22/21 1432  Temp    Pulse 119 02/22/21 1436  Resp 19 02/22/21 1436  SpO2 99 % 02/22/21 1436  Vitals shown include unvalidated device data.  Last Pain:  Vitals:   02/22/21 1237  TempSrc:   PainSc: 0-No pain         Complications: No notable events documented.

## 2021-02-23 LAB — CBC WITH DIFFERENTIAL/PLATELET
Abs Immature Granulocytes: 0.03 10*3/uL (ref 0.00–0.07)
Basophils Absolute: 0 10*3/uL (ref 0.0–0.1)
Basophils Relative: 0 %
Eosinophils Absolute: 0.1 10*3/uL (ref 0.0–0.5)
Eosinophils Relative: 1 %
HCT: 25.5 % — ABNORMAL LOW (ref 39.0–52.0)
Hemoglobin: 8.2 g/dL — ABNORMAL LOW (ref 13.0–17.0)
Immature Granulocytes: 0 %
Lymphocytes Relative: 19 %
Lymphs Abs: 2.3 10*3/uL (ref 0.7–4.0)
MCH: 30.7 pg (ref 26.0–34.0)
MCHC: 32.2 g/dL (ref 30.0–36.0)
MCV: 95.5 fL (ref 80.0–100.0)
Monocytes Absolute: 0.9 10*3/uL (ref 0.1–1.0)
Monocytes Relative: 7 %
Neutro Abs: 8.3 10*3/uL — ABNORMAL HIGH (ref 1.7–7.7)
Neutrophils Relative %: 73 %
Platelets: 172 10*3/uL (ref 150–400)
RBC: 2.67 MIL/uL — ABNORMAL LOW (ref 4.22–5.81)
RDW: 14.1 % (ref 11.5–15.5)
WBC: 11.6 10*3/uL — ABNORMAL HIGH (ref 4.0–10.5)
nRBC: 0 % (ref 0.0–0.2)

## 2021-02-23 MED ORDER — OXYBUTYNIN CHLORIDE 5 MG PO TABS: 5.0000 mg | ORAL_TABLET | Freq: Three times a day (TID) | ORAL | 0 refills | Status: DC | PRN

## 2021-02-23 NOTE — Anesthesia Postprocedure Evaluation (Signed)
Anesthesia Post Note  Patient: Eric Bradshaw  Procedure(s) Performed: CYSTOSCOPY/CLOT EVACTION/FULGURATION OF BLADDER TUMOR     Patient location during evaluation: PACU Anesthesia Type: General Level of consciousness: awake and alert Pain management: pain level controlled Vital Signs Assessment: post-procedure vital signs reviewed and stable Respiratory status: spontaneous breathing, nonlabored ventilation, respiratory function stable and patient connected to nasal cannula oxygen Cardiovascular status: blood pressure returned to baseline and stable Postop Assessment: no apparent nausea or vomiting Anesthetic complications: no   No notable events documented.  Last Vitals:  Vitals:   02/22/21 2044 02/23/21 0439  BP: 110/62 (!) 138/59  Pulse: (!) 102 89  Resp: 16 16  Temp: 36.7 C 36.7 C  SpO2: 94% 99%    Last Pain:  Vitals:   02/23/21 0800  TempSrc:   PainSc: 3                  Talli Kimmer S

## 2021-02-23 NOTE — Discharge Summary (Signed)
Date of admission: 02/21/2021  Date of discharge: 02/23/2021  Admission diagnosis: gross hematuria, urinary retention, bladder cancer  Discharge diagnosis: same  Secondary diagnoses:  Patient Active Problem List   Diagnosis Date Noted   Hematuria 02/21/2021   Presence of drug coated stent in LAD coronary artery 03/18/2019   Diabetes mellitus ([place]) 02/12/2016   Right carotid bruit 02/07/2016   Abnormal EKG- "inferior, laterl infarct" is chronic 02/24/2013   Diabetes mellitus type 2 in nonobese ([place]) 02/24/2013   Overweight (BMI 25.0-29.9)  08/27/2012   CAD - s/p CABG x 17 Mar 2010. Abnormal Myoview-LAD & D1 disease 06/2011 Rx'd with Promus DES 03/15/2010   Essential hypertension 03/15/2010   Hyperlipidemia associated with type 2 diabetes mellitus ([place]) 03/15/2010   Tobacco dependence 03/15/2010   NSTEMI (non-ST elevated myocardial infarction) ([place]) 03/15/2010   S/P CABG x 3 03/15/2010   S/P MVR (mitral valve repair) 03/15/2010   Ischemic Cardiomyopathy - EF ~35 -40%; Class I-II CHF 03/15/2000    Procedures performed: Procedure(s): CYSTOSCOPY/CLOT EVACTION/FULGURATION OF BLADDER TUMOR  History and Physical: For full details, please see admission history and physical. Briefly, Eric Bradshaw is a 65 y.o. year old patient with bladder cancer admitted following TURBT with gross hematuria requiring clot evacuation and fulugration.   Hospital Course: Patient tolerated the procedure well.  He was then transferred to the floor after an uneventful PACU stay.  His hospital course was uncomplicated.  On POD#1 he had met discharge criteria: was eating a regular diet, was up and ambulating independently,  pain was well controlled, urine was clear, and was ready to for discharge.   Laboratory values:  Recent Labs    02/21/21 0830 02/21/21 1539 02/23/21 0734  WBC 13.8* 11.6* 11.6*  HGB 9.3* 8.8* 8.2*  HCT 27.2* 26.6* 25.5*   Recent Labs    02/21/21 0830 02/21/21 1539  NA 137 141  K  4.2 4.0  CL 109 112*  CO2 23 24  GLUCOSE 240* 115*  BUN 32* 27*  CREATININE 1.72* 1.15  CALCIUM 8.4* 8.4*   No results for input(s): LABPT, INR in the last 72 hours. No results for input(s): LABURIN in the last 72 hours. Results for orders placed or performed during the hospital encounter of 03/15/10  MRSA PCR Screening     Status: None   Collection Time: 03/15/10  6:18 PM  Result Value Ref Range Status   MRSA by PCR  NEGATIVE Final    NEGATIVE        The GeneXpert MRSA Assay (FDA approved for NASAL specimens only), is one component of a comprehensive MRSA colonization surveillance program. It is not intended to diagnose MRSA infection nor to guide or monitor treatment for MRSA infections.  Culture, sputum-assessment     Status: None   Collection Time: 03/25/10 11:59 AM   Specimen: SPU  Result Value Ref Range Status   Specimen Description SPUTUM  Final   Special Requests NONE  Final   Sputum evaluation   Final    THIS SPECIMEN IS ACCEPTABLE. RESPIRATORY CULTURE REPORT TO FOLLOW.   Report Status 03/25/2010 FINAL  Final  Culture, respiratory     Status: None   Collection Time: 03/25/10 11:59 AM   Specimen: SPU  Result Value Ref Range Status   Specimen Description SPUTUM  Final   Special Requests NONE  Final   Gram Stain   Final    MODERATE WBC PRESENT, PREDOMINANTLY PMN FEW SQUAMOUS EPITHELIAL CELLS PRESENT MODERATE YEAST WITH PSEUDOHYPHAE  Culture MODERATE CANDIDA ALBICANS  Final   Report Status 03/27/2010 FINAL  Final    Disposition: Home  Discharge instruction: The patient was instructed to be ambulatory but told to refrain from heavy lifting, strenuous activity, or driving.   Discharge medications:  Allergies as of 02/23/2021       Reactions   Morphine And Related Other (See Comments)   HEADACHE        Medication List     STOP taking these medications    aspirin EC 81 MG tablet   clopidogrel 75 MG tablet Commonly known as: PLAVIX        TAKE these medications    carvedilol 12.5 MG tablet Commonly known as: COREG TAKE 1/2 TABLET BY MOUTH EVERY MORNING AND 1 TABLET AT NIGHT What changed:  how much to take how to take this when to take this additional instructions   Echinacea 400 MG Caps Take 400 mg by mouth in the morning.   Fish Oil 1000 MG Caps Take 1,000 mg by mouth in the morning.   GARLIQUE PO Take 1 tablet by mouth in the morning.   glimepiride 4 MG tablet Commonly known as: AMARYL Take 4 mg by mouth 2 (two) times daily.   ibuprofen 200 MG tablet Commonly known as: ADVIL Take 200-400 mg by mouth every 6 (six) hours as needed (pain.).   Jardiance 25 MG Tabs tablet Generic drug: empagliflozin Take 25 mg by mouth in the morning.   Magnesium 500 MG Tabs Take 500 mg by mouth in the morning.   multivitamins ther. w/minerals Tabs tablet Take 1 tablet by mouth daily.   nitrofurantoin (macrocrystal-monohydrate) 100 MG capsule Commonly known as: MACROBID Take 100 mg by mouth 2 (two) times daily.   oxybutynin 5 MG tablet Commonly known as: DITROPAN Take 1 tablet (5 mg total) by mouth every 8 (eight) hours as needed for bladder spasms.   rosuvastatin 40 MG tablet Commonly known as: CRESTOR Take 1 tablet (40 mg total) by mouth daily. What changed: when to take this   SUPER B COMPLEX PO Take 1 capsule by mouth daily.   traMADol 50 MG tablet Commonly known as: Ultram Take 1 tablet (50 mg total) by mouth every 6 (six) hours as needed. What changed: reasons to take this   valsartan 80 MG tablet Commonly known as: DIOVAN TAKE 1 TABLET(80 MG) BY MOUTH DAILY What changed: See the new instructions.   vitamin C 1000 MG tablet Take 1,000 mg by mouth in the morning.   Vitamin D-3 125 MCG (5000 UT) Tabs Take 5,000 Units by mouth in the morning.   vitamin E 1000 UNIT capsule Take 1,000 Units by mouth daily.   Zinc 50 MG Tabs Take 50 mg by mouth in the morning.        Followup:    Follow-up Information     [facility] Follow up.   Why: 03/01/21 2:45pm Dr. Oretha Ellis information: [place] [phone]

## 2021-02-24 ENCOUNTER — Encounter (HOSPITAL_COMMUNITY): Payer: Self-pay | Admitting: Urology

## 2021-02-28 LAB — SURGICAL PATHOLOGY

## 2021-04-24 NOTE — Progress Notes (Signed)
Cardiology Clinic Note   Patient Name: Eric Bradshaw Date of Encounter: 04/27/2021  Primary Care Provider:  Jenel Lucks, PA-C Primary Cardiologist:  Glenetta Hew, MD  Patient Profile    Eric Bradshaw presents to the clinic today for follow-up evaluation of his ischemic cardiomyopathy and coronary artery disease.  Past Medical History    Past Medical History:  Diagnosis Date   CAD S/P percutaneous coronary angioplasty 03/2010 -- 3-07/2011   Proximal LAD ~80% (into D2) & mid - subtotal occlusion , RCA occlusion, LCx occlusion -- s/p CABG x 3  (LIMA-LAD, SVG-OM, SVG-RPDA); Cath 06/2011, patent grafts with severe proximal  LAD - D1 lesions -- 07/2011: PCI  proxLAD-into D2 with Promus Premier DES 2.5 mm x 38 mm (distal in D2 2.5 mm, @ bifurcation - 2.75 m, in prox LAD ~3.0 mm   Carotid artery disease (HCC)    moderate by 2017 duplex US   Chronic combined systolic and diastolic HF (heart failure), NYHA class 2 ([place]) 03/2010; 08/2103   a) EF ~30-30%; Exertional Dyspnea; No exaccerbations.;; b)EF 35-40%, Mod LA dilation   DM (diabetes mellitus) type II controlled peripheral vascular disorder 04/16/2011   & CAD   Heart murmur    History of kidney stones    HTN, goal below 130/80 03/15/2010   Hyperlipidemia LDL goal < 70    Ischemic cardiomyopathy 03/2010; May 2015   a) Echo - EF 30-40%; mild/mod anterior wall hypokinesis; doppler flow suggestive of impaired LV relaxation; LA moderately dilated; annuloplasty ring noted in mitral position; mild/mod mitral regurgitation; RVsystolic pressure elevated at 30-50mmHg;;; b) Echo May 2015: EF 35-40%   NSTEMI (non-ST elevated myocardial infarction) ([place]) 59/74/1638   Complicated by Severe Acute Systolic CHF -- Class IV CHF,   S/P CABG x 3 03/15/2010   LIMA-mLAD, SVG-OM, SVG-rPDA   S/P MVR (mitral valve repair) 03/15/2010   @ time of CABG for ischemic MR - 26 mm Edwards physio-2 annuloplasty ring   Tobacco dependence    Initially  quit - restarted ~mid 2012; now essentially quit.   Past Surgical History:  Procedure Laterality Date   CORONARY ARTERY BYPASS GRAFT  03/2010   LIMA-LAD, SVG-OM, SVG-RPDA   FULGURATION OF BLADDER TUMOR N/A 02/22/2021   Procedure: CYSTOSCOPY/CLOT EVACTION/FULGURATION OF BLADDER TUMOR;  Surgeon: Robley Fries, MD;  Location: [facility];  Service: Urology;  Laterality: N/A;   Left Arm Surgery - NOS     LEFT HEART CATHETERIZATION WITH CORONARY/GRAFT ANGIOGRAM  07/03/2011   Procedure: LEFT HEART CATHETERIZATION WITH Beatrix Fetters;  Surgeon: Leonie Man, MD;  Location: [facility] CATH LAB;  Service: Cardiovascular;Patent LIMA-LAD, SVG-OM, SVG-RPDA; (native RCA & OM prox 100%, prox LAD into major D2 ~80% long lesion, midLAD 100%   MITRAL VALVULOPLASTY  03/2010   conjuntive with CABG for Severe MR; 26 mm Edwards physio-2 annuloplasty ring   NM MYOVIEW LTD  04/24/2011   R/L MV - EF 37%; mild perfusion defect due to attenuation w/mild to mod superimposed ischemia seen in apex, apical lateral and distal to mid ateroseptal region; LV systolic fcn moderately reduced; hypotensive BP response to stress w/o chest pain; high risk scan   PERCUTANEOUS CORONARY STENT INTERVENTION (PCI-S) N/A 08/02/2011   Procedure: PERCUTANEOUS CORONARY STENT INTERVENTION (PCI-S);  Surgeon: Leonie Man, MD;  Location: [facility] CATH LAB;  Service: Cardiovascular;  PCI proximal LAD-D2: Promus Premier DES 2.5 x 38 (tapered post-dilation from 3.0 mm in proximal LAD, 2.75 mm at bifurcation and 2.5 mm in D2)  TONSILLECTOMY     Childhood   TRANSTHORACIC ECHOCARDIOGRAM  03/23/2010    Pre-CABG/MVR: EF 25-30%; global HK; Mod-Severe MR   TRANSTHORACIC ECHOCARDIOGRAM  09/2011; 08/2013   a) EF up to 30-40% (post PCI); mild-mod Ant wall HK; Gd 1 DD; MV Annuloplasty ring in place, mild-mod MR; mild PHTN (30-40 mmHg);; b)  EF 35-40% with moderately reduced function. Moderately dilated LV with distal septal-apical, mid and basal inferior  akinesis; status post mitral valve ring. Significant diastolic gradient with mild MR. Moderate LA dilation.     TRANSURETHRAL RESECTION OF BLADDER TUMOR N/A 01/30/2021   Procedure: RESTAGING TRANSURETHRAL RESECTION OF BLADDER TUMOR (TURBT);  Surgeon: Robley Fries, MD;  Location: [facility];  Service: Urology;  Laterality: N/A;  1 HR   TRANSURETHRAL RESECTION OF BLADDER TUMOR N/A 02/20/2021   Procedure: TRANSURETHRAL RESECTION OF BLADDER TUMOR (TURBT), RESTAGING;  Surgeon: Robley Fries, MD;  Location: [facility];  Service: Urology;  Laterality: N/A;  GENERAL ANESTHESIA WITH PARALYSIS   TRANSURETHRAL RESECTION OF BLADDER TUMOR WITH MITOMYCIN-C N/A 12/26/2020   Procedure: TRANSURETHRAL RESECTION OF BLADDER TUMOR WITH GEMCITABINE;  Surgeon: Robley Fries, MD;  Location: [facility];  Service: Urology;  Laterality: N/A;    Allergies  Allergies  Allergen Reactions   Morphine And Related Other (See Comments)    HEADACHE    History of Present Illness    Eric Bradshaw has a PMH of ischemic cardiomyopathy EF 35-40%, coronary artery disease status post CABG 2011, abnormal Myoview-LAD and D1 disease 3/13 with PCI and DES, essential hypertension, NSTEMI, hyperlipidemia, diabetes mellitus, tobacco dependence, overweight, status post mitral valve repair, and right carotid bruit.  He was seen by Dr. Ellyn Hack 04/18/2020.  During that time he continued to work at both [employer].  He also stays very active at home doing yard work and other various activities.  He denied cardiac symptoms.  He did note some allergy related coughing.  He reported cutting back on his cigarette smoking.  He admitted to dietary indiscretion.  He also noted that he had been off his medications for several months.  His Diovan 80 mg daily, carvedilol, and rosuvastatin were restarted.  He presents to the clinic today for follow-up evaluation and states he has had for recent surgeries for bladder cancer and tumor removal.  He is  now receiving chemotherapy and had a treatment yesterday.  He reports this is increased his stress level and he continues to smoke cigarettes and drink nightly.  We reviewed the importance of cessation of both alcohol and smoking.  He remains physically active working both at the [employer] at [place].  I will give him smoking cessation information, mindfulness stress reduction, have him maintain his physical activity, give a salty 6 diet sheet and plan follow-up for 8 to 9 months.  Today he denies chest pain, shortness of breath, lower extremity edema, fatigue, palpitations, melena, hematuria, hemoptysis, diaphoresis, weakness, presyncope, syncope, orthopnea, and PND.  Home Medications    Prior to Admission medications   Medication Sig Start Date End Date Taking? Authorizing Provider  Ascorbic Acid (VITAMIN C) 1000 MG tablet Take 1,000 mg by mouth in the morning.    [provider]  B Complex-C (SUPER B COMPLEX PO) Take 1 capsule by mouth daily.    [provider]  carvedilol (COREG) 12.5 MG tablet TAKE 1/2 TABLET BY MOUTH EVERY MORNING AND 1 TABLET AT NIGHT Patient taking differently: Take 6.25-12.5 mg by mouth See admin instructions. Taking 6.25 (  1/2) tablet in the AM and 12.5 mg ( 1 tab) in the evening 05/02/20   Leonie Man, MD  Cholecalciferol (VITAMIN D-3) 125 MCG (5000 UT) TABS Take 5,000 Units by mouth in the morning.    [provider]  Echinacea 400 MG CAPS Take 400 mg by mouth in the morning.    [provider]  Garlic (GARLIQUE PO) Take 1 tablet by mouth in the morning.    [provider]  glimepiride (AMARYL) 4 MG tablet Take 4 mg by mouth 2 (two) times daily. 01/21/20   [provider]  ibuprofen (ADVIL) 200 MG tablet Take 200-400 mg by mouth every 6 (six) hours as needed (pain.).    [provider]  JARDIANCE 25 MG TABS tablet Take 25 mg by mouth in the morning. 03/23/20   [provider]  Magnesium 500 MG  TABS Take 500 mg by mouth in the morning.    [provider]  Multiple Vitamins-Minerals (MULTIVITAMINS THER. W/MINERALS) TABS Take 1 tablet by mouth daily.    [provider]  nitrofurantoin, macrocrystal-monohydrate, (MACROBID) 100 MG capsule Take 100 mg by mouth 2 (two) times daily. 02/12/21   [provider]  Omega-3 Fatty Acids (FISH OIL) 1000 MG CAPS Take 1,000 mg by mouth in the morning.    [provider]  oxybutynin (DITROPAN) 5 MG tablet Take 1 tablet (5 mg total) by mouth every 8 (eight) hours as needed for bladder spasms. 02/23/21   Robley Fries, MD  rosuvastatin (CRESTOR) 40 MG tablet Take 1 tablet (40 mg total) by mouth daily. Patient taking differently: Take 40 mg by mouth every evening. 03/18/19 12/23/22  Leonie Man, MD  traMADol (ULTRAM) 50 MG tablet Take 1 tablet (50 mg total) by mouth every 6 (six) hours as needed. Patient taking differently: Take 50 mg by mouth every 6 (six) hours as needed for moderate pain. 01/30/21 01/30/22  Robley Fries, MD  valsartan (DIOVAN) 80 MG tablet TAKE 1 TABLET(80 MG) BY MOUTH DAILY Patient taking differently: Take 80 mg by mouth daily. 04/05/20   Leonie Man, MD  vitamin E 1000 UNIT capsule Take 1,000 Units by mouth daily.    [provider]  Zinc 50 MG TABS Take 50 mg by mouth in the morning.    [provider]    Family History    Family History  Problem Relation Age of Onset   Heart failure Father 74       History of MI & severe CAD; recently deceased.   Lung cancer Mother    Cancer Maternal Grandmother    Stroke Maternal Grandfather    Diabetes Paternal Grandmother    Diabetes Paternal Grandfather    Stroke Paternal Grandfather    Kidney failure Brother        deceased at 40 (was born premature)   He indicated that the status of his mother is unknown. He indicated that his father is deceased. He indicated that the status of his brother is unknown. He indicated  that the status of his maternal grandmother is unknown. He indicated that the status of his maternal grandfather is unknown. He indicated that the status of his paternal grandmother is unknown. He indicated that the status of his paternal grandfather is unknown.  Social History    Social History   Socioeconomic History   Marital status: Single    Spouse name: Girfriend   Number of children: 2   Years of education:  Not on file   Highest education level: Not on file  Occupational History    Employer: [employer]    Comment: Currently on FMLA from [employer]  due to inability to qualify for CDL due to EF < 40%  Tobacco Use   Smoking status: Former    Packs/day: 0.00    Years: 50.00    Pack years: 0.00    Types: Cigarettes   Smokeless tobacco: Former    Types: Chew   Tobacco comments:    Quit in 2011 but restarted mid 2012,uses E-cigarettes; Not actually smoking, but does have 2nd hand exposure.  Vaping Use   Vaping Use: Former   Start date: 02/28/2016  Substance and Sexual Activity   Alcohol use: Yes    Alcohol/week: 3.0 standard drinks    Types: 3 Standard drinks or equivalent per week    Comment: 3-6 Beers per week   Drug use: No   Sexual activity: Yes  Other Topics Concern   Not on file  Social History Narrative   He currently lives with girlfriend. He has 2 children from his ex-wife.   Still smokes less than a quarter pack a day. He goes back and forth between having quit in smoking again.   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 He is hoping that he can increase his hours at least one of those jobs and up to to get him back on an insurance plan.   Social Determinants of Health   Financial Resource Strain: Not on file  Food Insecurity: Not on file  Transportation Needs: Not on file  Physical Activity: Not on file  Stress: Not on file  Social Connections: Not on file  Intimate Partner Violence: Not on file      Review of Systems    General:  No chills, fever, night sweats or weight changes.  Cardiovascular:  No chest pain, dyspnea on exertion, edema, orthopnea, palpitations, paroxysmal nocturnal dyspnea. Dermatological: No rash, lesions/masses Respiratory: No cough, dyspnea Urologic: No hematuria, dysuria Abdominal:   No nausea, vomiting, diarrhea, bright red blood per rectum, melena, or hematemesis Neurologic:  No visual changes, wkns, changes in mental status. All other systems reviewed and are otherwise negative except as noted above.  Physical Exam    VS:  BP 126/70 (BP Location: Left Arm, Patient Position: Sitting, Cuff Size: Normal)    Pulse 84    Ht 5\' 6"  (1.676 m)    Wt 152 lb 6.4 oz (69.1 kg)    SpO2 97%    BMI 24.60 kg/m  , BMI Body mass index is 24.6 kg/m. GEN: Well nourished, well developed, in no acute distress. HEENT: normal. Neck: Supple, no JVD, carotid bruits, or masses. Cardiac: RRR, no murmurs, rubs, or gallops. No clubbing, cyanosis, edema.  Radials/DP/PT 2+ and equal bilaterally.  Respiratory:  Respirations regular and unlabored, clear to auscultation bilaterally. GI: Soft, nontender, nondistended, BS + x 4. MS: no deformity or atrophy. Skin: warm and dry, no rash. Neuro:  Strength and sensation are intact. Psych: Normal affect.  Accessory Clinical Findings    Recent Labs: 02/21/2021: ALT 13; BUN 27; Creatinine, Ser 1.15; Potassium 4.0; Sodium 141 02/23/2021: Hemoglobin 8.2; Platelets 172   Recent Lipid Panel    Component Value Date/Time   CHOL 143 04/05/2019 0850   TRIG 183 (H) 04/05/2019 0850   HDL 42 04/05/2019 0850   CHOLHDL 3.4 04/05/2019 0850   CHOLHDL 4.0  02/07/2016 1303   VLDL 18 02/07/2016 1303   LDLCALC 70 04/05/2019 0850    ECG personally reviewed by me today-none today.  Echocardiogram 08/25/2013  Study Conclusions   - Left ventricle: Distal septal apical , mid and basal    inferior akinesis The cavity size was moderately dilated.     Wall thickness was normal. Systolic function was    moderately reduced. The estimated ejection fraction was in    the range of 35% to 40%.  - Mitral valve: S/P mitral valve ring? significant diastolic    gradient and mild residual MR Mild regurgitation. Valve    area by continuity equation (using LVOT flow): 1.41cm^2.  - Left atrium: The atrium was moderately dilated.  - Atrial septum: No defect or patent foramen ovale was    identified.   Assessment & Plan   1.  Ischemic cardiomyopathy-no increased DOE or activity intolerance.  Echocardiogram 5/15 showed an EF 35-40%, and mild mitral valve regurgitation Continue valsartan, Jardiance, Heart healthy low-sodium diet-salty 6 given Increase physical activity as tolerated  Coronary artery disease-no recent episodes of arm neck back or chest discomfort.  Underwent CABG x312/11 Continue rosuvastatin, omega-3 fatty acids, carvedilol Heart healthy low-sodium diet-salty 6 given Increase physical activity as tolerated  Essential hypertension-BP today 126/70.  Well-controlled at home. Continue valsartan, carvedilol Heart healthy low-sodium diet-salty 6 given Increase physical activity as tolerated  Hyperlipidemia-LDL 70 on 04/05/2019 Continue rosuvastatin, omega-3 fatty acids Heart healthy low-sodium high-fiber diet Increase physical activity as tolerated Follows with PCP  Status post mitral valve repair-faint murmur 1/6 heard along apex.  Tolerating  physical activity well. Will postpone follow-up echocardiogram until after cancer treatment  Disposition: Follow-up with Dr. Ellyn Hack in  8-9 months.   Jossie Ng. Inman Fettig NP-C    04/27/2021, 11:35 AM [address] Office [phone] Fax [phone]  Notice: This dictation was prepared with Dragon dictation along with smaller phrase technology. Any transcriptional errors that result from this process are unintentional and may not be  corrected upon review.  I spent 15 minutes examining this patient, reviewing medications, and using patient centered shared decision making involving her cardiac care.  Prior to her visit I spent greater than 20 minutes reviewing her past medical history,  medications, and prior cardiac tests.

## 2021-04-27 ENCOUNTER — Ambulatory Visit (INDEPENDENT_AMBULATORY_CARE_PROVIDER_SITE_OTHER): Payer: Medicare Other | Admitting: General Practice

## 2021-04-27 ENCOUNTER — Encounter: Payer: Self-pay | Admitting: General Practice

## 2021-04-27 ENCOUNTER — Other Ambulatory Visit: Payer: Self-pay

## 2021-04-27 VITALS — BP 126/70 | HR 84 | Ht 66.0 in | Wt 152.4 lb

## 2021-04-27 DIAGNOSIS — I255 Ischemic cardiomyopathy: Secondary | ICD-10-CM | POA: Diagnosis not present

## 2021-04-27 DIAGNOSIS — I10 Essential (primary) hypertension: Secondary | ICD-10-CM

## 2021-04-27 DIAGNOSIS — Z951 Presence of aortocoronary bypass graft: Secondary | ICD-10-CM | POA: Diagnosis not present

## 2021-04-27 DIAGNOSIS — Z9889 Other specified postprocedural states: Secondary | ICD-10-CM

## 2021-04-27 DIAGNOSIS — E1169 Type 2 diabetes mellitus with other specified complication: Secondary | ICD-10-CM

## 2021-04-27 DIAGNOSIS — E785 Hyperlipidemia, unspecified: Secondary | ICD-10-CM

## 2021-04-27 NOTE — Patient Instructions (Signed)
Medication Instructions:  The current medical regimen is effective;  continue present plan and medications as directed. Please refer to the Current Medication list given to you today.   *If you need a refill on your cardiac medications before your next appointment, please call your pharmacy*  Lab Work:   Testing/Procedures:  NONE    NONE  Special Instructions PLEASE READ AND FOLLOW SALTY 6-ATTACHED-1,800mg  daily  PLEASE READ AND FOLLOW SMOKING CESSATION-ATTACHED  PLEASE READ AND FOLLOW STRESS REDUCTION TIPS-ATTACHED  Follow-Up: Your next appointment:  8-9 month(s) In Person with Glenetta Hew, MD   Please call our office 2 months in advance to schedule this appointment  :1  At [facility], you and your health needs are our priority.  As part of our continuing mission to provide you with exceptional heart care, we have created designated Provider Care Teams.  These Care Teams include your primary Cardiologist (physician) and Advanced Practice Providers (APPs -  Physician Assistants and Nurse Practitioners) who all work together to provide you with the care you need, when you need it.            6 SALTY THINGS TO AVOID     1,800MG  DAILY     Mindfulness-Based Stress Reduction Mindfulness-based stress reduction (MBSR) is a program that helps people learn to practice mindfulness. Mindfulness is the practice of consciously paying attention to the present moment. MBSR focuses on developing self-awareness, which lets you respond to life stress without judgment or negative feelings. It can be learned and practiced through techniques such as education, breathing exercises, meditation, and yoga. MBSR includes several mindfulness techniques in one program. MBSR works best when you understand the treatment, are willing to try new things, and can commit to spending time practicing what you learn. MBSR training may include learning about: How your feelings, thoughts, and reactions affect your  body. New ways to respond to things that cause negative thoughts to start (triggers). How to notice your thoughts and let go of them. Practicing awareness of everyday things that you normally do without thinking. The techniques and goals of different types of meditation. What are the benefits of MBSR? MBSR can have many benefits, which include helping you to: Develop self-awareness. This means knowing and understanding yourself. Learn skills and attitudes that help you to take part in your own health care. Learn new ways to care for yourself. Be more accepting about how things are, and let things go. Be less judgmental and approach things with an open mind. Be patient with yourself and trust yourself more. MBSR has also been shown to: Reduce negative emotions, such as sadness, overwhelm, and worry. Improve memory and focus. Change how you sense and react to pain. Boost your body's ability to fight infections. Help you connect better with other people. Improve your sense of well-being. How to practice mindfulness To do a basic awareness exercise: Find a comfortable place to sit. Pay attention to the present moment. Notice your thoughts, feelings, and surroundings just as they are. Avoid judging yourself, your feelings, or your surroundings. Make note of any judgment that comes up and let it go. Your mind may wander, and that is okay. Make note of when your thoughts drift, and return your attention to the present moment. To do basic mindfulness meditation: Find a comfortable place to sit. This may include a stable chair or a firm floor cushion. Sit upright with your back straight. Let your arms fall next to your sides, with your hands resting on  your legs. If you are sitting in a chair, rest your feet flat on the floor. If you are sitting on a cushion, cross your legs in front of you. Keep your head in a neutral position with your chin dropped slightly. Relax your jaw and rest the tip of  your tongue on the roof of your mouth. Drop your gaze to the floor or close your eyes. Breathe normally and pay attention to your breath. Feel the air moving in and out of your nose. Feel your belly expanding and relaxing with each breath. Your mind may wander, and that is okay. Make note of when your thoughts drift, and return your attention to your breath. Avoid judging yourself, your feelings, or your surroundings. Make note of any judgment or feelings that come up, let them go, and bring your attention back to your breath. When you are ready, lift your gaze or open your eyes. Pay attention to how your body feels after the meditation. Follow these instructions at home:  Find a local in-person or online MBSR program. Set aside some time regularly for mindfulness practice. Practice every day if you can. Even 10 minutes of practice is helpful. Find a mindfulness practice that works best for you. This may include one or more of the following: Meditation. This involves focusing your mind on a certain thought or activity. Breathing awareness exercises. These help you to stay present by focusing on your breath. Body scan. For this practice, you lie down and pay attention to each part of your body from head to toe. You can identify tension and soreness and consciously relax parts of your body. Yoga. Yoga involves stretching and breathing, and it can improve your ability to move and be flexible. It can also help you to test your body's limits, which can help you release stress. Mindful eating. This way of eating involves focusing on the taste, texture, color, and smell of each bite of food. This slows down eating and helps you feel full sooner. For this reason, it can be an important part of a weight loss plan. Find a podcast or recording that provides guidance for breathing awareness, body scan, or meditation exercises. You can listen to these any time when you have a free moment to rest without  distractions. Follow your treatment plan as told by your health care provider. This may include taking regular medicines and making changes to your diet or lifestyle as recommended. Where to find more information You can find more information about MBSR from: Your health care provider. Community-based meditation centers or programs. Programs offered near you. Summary Mindfulness-based stress reduction (MBSR) is a program that teaches you how to consciously pay attention to the present moment. It is used to help you deal better with daily stress, feelings, and pain. MBSR focuses on developing self-awareness, which allows you to respond to life stress without judgment or negative feelings. MBSR programs may involve learning different mindfulness practices, such as breathing exercises, meditation, yoga, body scan, or mindful eating. Find a mindfulness practice that works best for you, and set aside time for it on a regular basis.   Steps to Quit Smoking Smoking tobacco is the leading cause of preventable death. It can affect almost every organ in the body. Smoking puts you and people around you at risk for many serious, long-lasting (chronic) diseases. Quitting smoking can be hard, but it is one of the best things that you can do for your health. It is never too late to  quit. How do I get ready to quit? When you decide to quit smoking, make a plan to help you succeed. Before you quit: Pick a date to quit. Set a date within the next 2 weeks to give you time to prepare. Write down the reasons why you are quitting. Keep this list in places where you will see it often. Tell your family, friends, and co-workers that you are quitting. Their support is important. Talk with your doctor about the choices that may help you quit. Find out if your health insurance will pay for these treatments. Know the people, places, things, and activities that make you want to smoke (triggers). Avoid them. What first  steps can I take to quit smoking? Throw away all cigarettes at home, at work, and in your car. Throw away the things that you use when you smoke, such as ashtrays and lighters. Clean your car. Make sure to empty the ashtray. Clean your home, including curtains and carpets. What can I do to help me quit smoking? Talk with your doctor about taking medicines and seeing a counselor at the same time. You are more likely to succeed when you do both. If you are pregnant or breastfeeding, talk with your doctor about counseling or other ways to quit smoking. Do not take medicine to help you quit smoking unless your doctor tells you to do so. To quit smoking: Quit right away Quit smoking totally, instead of slowly cutting back on how much you smoke over a period of time. Go to counseling. You are more likely to quit if you go to counseling sessions regularly. Take medicine You may take medicines to help you quit. Some medicines need a prescription, and some you can buy over-the-counter. Some medicines may contain a drug called nicotine to replace the nicotine in cigarettes. Medicines may: Help you to stop having the desire to smoke (cravings). Help to stop the problems that come when you stop smoking (withdrawal symptoms). Your doctor may ask you to use: Nicotine patches, gum, or lozenges. Nicotine inhalers or sprays. Non-nicotine medicine that is taken by mouth. Find resources Find resources and other ways to help you quit smoking and remain smoke-free after you quit. These resources are most helpful when you use them often. They include: Online chats with a Social worker. Phone quitlines. Printed Furniture conservator/restorer. Support groups or group counseling. Text messaging programs. Mobile phone apps. Use apps on your mobile phone or tablet that can help you stick to your quit plan. There are many free apps for mobile phones and tablets as well as websites. Examples include Quit Guide from the [employer] and  smokefree.gov  What things can I do to make it easier to quit?  Talk to your family and friends. Ask them to support and encourage you. Call a phone quitline (1-800-QUIT-NOW), reach out to support groups, or work with a Social worker. Ask people who smoke to not smoke around you. Avoid places that make you want to smoke, such as: Bars. Parties. Smoke-break areas at work. Spend time with people who do not smoke. Lower the stress in your life. Stress can make you want to smoke. Try these things to help your stress: Getting regular exercise. Doing deep-breathing exercises. Doing yoga. Meditating. Doing a body scan. To do this, close your eyes, focus on one area of your body at a time from head to toe. Notice which parts of your body are tense. Try to relax the muscles in those areas. How will I feel when  I quit smoking? Day 1 to 3 weeks Within the first 24 hours, you may start to have some problems that come from quitting tobacco. These problems are very bad 2-3 days after you quit, but they do not often last for more than 2-3 weeks. You may get these symptoms: Mood swings. Feeling restless, nervous, angry, or annoyed. Trouble concentrating. Dizziness. Strong desire for high-sugar foods and nicotine. Weight gain. Trouble pooping (constipation). Feeling like you may vomit (nausea). Coughing or a sore throat. Changes in how the medicines that you take for other issues work in your body. Depression. Trouble sleeping (insomnia). Week 3 and afterward After the first 2-3 weeks of quitting, you may start to notice more positive results, such as: Better sense of smell and taste. Less coughing and sore throat. Slower heart rate. Lower blood pressure. Clearer skin. Better breathing. Fewer sick days. Quitting smoking can be hard. Do not give up if you fail the first time. Some people need to try a few times before they succeed. Do your best to stick to your quit plan, and talk with your  doctor if you have any questions or concerns. Summary Smoking tobacco is the leading cause of preventable death. Quitting smoking can be hard, but it is one of the best things that you can do for your health. When you decide to quit smoking, make a plan to help you succeed. Quit smoking right away, not slowly over a period of time. When you start quitting, seek help from your doctor, family, or friends. This information is not intended to replace advice given to you by your health care provider. Make sure you discuss any questions you have with your health care provider. Document Revised: 12/08/2020 Document Reviewed: 06/20/2018 Elsevier Patient Education  [place].

## 2021-06-13 ENCOUNTER — Other Ambulatory Visit: Payer: Self-pay | Admitting: Cardiology

## 2021-07-31 ENCOUNTER — Other Ambulatory Visit: Payer: Self-pay | Admitting: Urology

## 2021-08-09 ENCOUNTER — Encounter (HOSPITAL_COMMUNITY): Payer: Self-pay

## 2021-08-09 NOTE — Patient Instructions (Addendum)
DUE TO COVID-19 ONLY TWO VISITORS  (aged 68 and older)  ARE ALLOWED TO COME WITH YOU AND STAY IN THE WAITING ROOM ONLY DURING PRE OP AND PROCEDURE.   ?**NO VISITORS ARE ALLOWED IN THE SHORT STAY AREA OR RECOVERY ROOM!!* ? ? ? Your procedure is scheduled on: 08-14-21 ? ? Report to [facility] Main Entrance ? ?  Report to admitting at     1000 AM ? ? Call this number if you have problems the morning of surgery [phone] ? ? Do not eat food :After Midnight. ? ? After Midnight you may have the following liquids until __1000____ AM DAY OF SURGERY ? ?Water ?Black Coffee (sugar ok, NO MILK/CREAM OR CREAMERS)  ?Tea (sugar ok, NO MILK/CREAM OR CREAMERS) regular and decaf                             ?Plain Jell-O (NO RED)                                           ?Fruit ices (not with fruit pulp, NO RED)                                     ?Popsicles (NO RED)                                                                  ?Juice: apple, WHITE grape, WHITE cranberry ?Sports drinks like Gatorade (NO RED) ?Clear broth(vegetable,chicken,beef) ? ?            ?       If you have questions, please contact your surgeon?s office. ? ? ?FOLLOW ANY ADDITIONAL PRE OP INSTRUCTIONS YOU RECEIVED FROM YOUR SURGEON'S OFFICE!!! ?  ?  ?Oral Hygiene is also important to reduce your risk of infection.                                    ?Remember - BRUSH YOUR TEETH THE MORNING OF SURGERY WITH YOUR REGULAR TOOTHPASTE ? ? Do NOT smoke after Midnight ? ? Take these medicines the morning of surgery with A SIP OF WATER: carvedilol ? ?              Hold Jardiance the day before your surgery and none day of surgery ?              Glimepiride do not take evening dose day before surgery and none day of surgery ? ?DO NOT TAKE ANY ORAL DIABETIC MEDICATIONS DAY OF YOUR SURGERY ? ?Bring CPAP mask and tubing day of surgery. ?                  ?           You may not have any metal on your body including hair pins, jewelry, and body piercing ? ?            Do not wear lotions,  powders, /cologne, or deodorant ? ? ?            Men may shave face and neck. ? ? Do not bring valuables to the hospital. [facility] NOT ?            RESPONSIBLE   FOR VALUABLES. ? ? Contacts, dentures or bridgework may not be worn into surgery. ? ? Bring small overnight bag day of surgery. ?  ? Patients discharged on the day of surgery will not be allowed to drive home.  Someone NEEDS to stay with you for the first 24 hours after anesthesia. ? ?  ?            Please read over the following fact sheets you were given: [employer] [phone] ? ? ?How to Manage Your Diabetes ?Before and After Surgery ? ?Why is it important to control my blood sugar before and after surgery? ?Improving blood sugar levels before and after surgery helps healing and can limit problems. ?A way of improving blood sugar control is eating a healthy diet by: ? Eating less sugar and carbohydrates ? Increasing activity/exercise ? Talking with your doctor about reaching your blood sugar goals ?High blood sugars (greater than 180 mg/dL) can raise your risk of infections and slow your recovery, so you will need to focus on controlling your diabetes during the weeks before surgery. ?Make sure that the doctor who takes care of your diabetes knows about your planned surgery including the date and location. ? ?How do I manage my blood sugar before surgery? ?Check your blood sugar at least 4 times a day, starting 2 days before surgery, to make sure that the level is not too high or low. ?Check your blood sugar the morning of your surgery when you wake up and every 2 hours until you get to the Short Stay unit. ?If your blood sugar is less than 70 mg/dL, you will need to treat for low blood sugar: ?Do not take insulin. ?Treat a low blood sugar (less than 70 mg/dL) with ? cup of clear juice (cranberry or apple), 4 glucose tablets, OR glucose gel. ?Recheck blood sugar in 15  minutes after treatment (to make sure it is greater than 70 mg/dL). If your blood sugar is not greater than 70 mg/dL on recheck, call [phone] for further instructions. ?Report your blood sugar to the short stay nurse when you get to Short Stay. ? ?If you are admitted to the hospital after surgery: ?Your blood sugar will be checked by the staff and you will probably be given insulin after surgery (instead of oral diabetes medicines) to make sure you have good blood sugar levels. ?The goal for blood sugar control after surgery is 80-180 mg/dL. ? ? ?WHAT DO I DO ABOUT MY DIABETES MEDICATION? ? ?Do not take oral diabetes medicines (pills) the morning of surgery. ? ? ?   [facility] - Preparing for Surgery ?Before surgery, you can play an important role.  Because skin is not sterile, your skin needs to be as free of germs as possible.  You can reduce the number of germs on your skin by washing with CHG (chlorahexidine gluconate) soap before surgery.  CHG is an antiseptic cleaner which kills germs and bonds with the skin to continue killing germs even after washing. ?Please DO NOT use if you have an allergy to CHG or antibacterial soaps.  If your skin becomes reddened/irritated stop using the  CHG and inform your nurse when you arrive at Short Stay. ?Do not shave (including legs and underarms) for at least 48 hours prior to the first CHG shower.  You may shave your face/neck. ?Please follow these instructions carefully: ? 1.  Shower with CHG Soap the night before surgery and the  morning of Surgery. ? 2.  If you choose to wash your hair, wash your hair first as usual with your  normal  shampoo. ? 3.  After you shampoo, rinse your hair and body thoroughly to remove the  shampoo.                           4.  Use CHG as you would any other liquid soap.  You can apply chg directly  to the skin and wash  ?                     Gently with a scrungie or clean washcloth. ? 5.  Apply the CHG Soap to your body ONLY FROM THE  NECK DOWN.   Do not use on face/ open      ?                     Wound or open sores. Avoid contact with eyes, ears mouth and genitals (private parts).  ?                     Production manager,  Genitals (private parts) with your normal soap. ?            6.  Wash thoroughly, paying special attention to the area where your surgery  will be performed. ? 7.  Thoroughly rinse your body with warm water from the neck down. ? 8.  DO NOT shower/wash with your normal soap after using and rinsing off  the CHG Soap. ?               9.  Pat yourself dry with a clean towel. ?           10.  Wear clean pajamas. ?           11.  Place clean sheets on your bed the night of your first shower and do not  sleep with pets. ?Day of Surgery : ?Do not apply any lotions/deodorants the morning of surgery.  Please wear clean clothes to the hospital/surgery center. ? ?FAILURE TO FOLLOW THESE INSTRUCTIONS MAY RESULT IN THE CANCELLATION OF YOUR SURGERY ?PATIENT SIGNATURE_________________________________ ? ?NURSE SIGNATURE__________________________________ ? ?________________________________________________________________________  ?

## 2021-08-09 NOTE — Progress Notes (Addendum)
PCP - Truddie Crumble MD [place] 06-08-21 epic  ?Cardiologist - LOV 04-27-21 Annamaria Boots Dr. Ellyn Hack epic cleared 02-12-21 for TURBT epic ? ?PPM/ICD -  ?Device Orders -  ?Rep Notified -  ? ?Chest x-ray -  ?EKG -  ?Stress Test -  ?ECHO - 2015 ?Cardiac Cath -  ? ?Sleep Study -  ?CPAP -  ? ?Fasting Blood Sugar -  ?Checks Blood Sugar _____ times a day ? ?Blood Thinner Instructions:Plavix  stop 5-7 days ?Aspirin Instructions:ASA 81 mg   ? ?ERAS Protcol - ?PRE-SURGERY Ensure or G2-  ? ? ?COVID vaccine - ? ?Activity--Able to walk a flight of stairs without SOB ?Anesthesia review: CHF, CABGx3, DMII, HTN, murmur   ? ?Patient denies shortness of breath, fever, cough and chest pain at PAT appointment ? ? ?All instructions explained to the patient, with a verbal understanding of the material. Patient agrees to go over the instructions while at home for a better understanding. Patient also instructed to self quarantine after being tested for COVID-19. The opportunity to ask questions was provided. ?  ?

## 2021-08-10 ENCOUNTER — Encounter (HOSPITAL_COMMUNITY)
Admission: RE | Admit: 2021-08-10 | Discharge: 2021-08-10 | Disposition: A | Discharge status: Home / Self Care | Payer: Medicare Other | Source: Ambulatory Visit | Attending: Urology | Admitting: Urology

## 2021-08-10 ENCOUNTER — Encounter (HOSPITAL_COMMUNITY): Payer: Self-pay

## 2021-08-10 ENCOUNTER — Other Ambulatory Visit: Payer: Self-pay

## 2021-08-10 DIAGNOSIS — I110 Hypertensive heart disease with heart failure: Secondary | ICD-10-CM | POA: Diagnosis not present

## 2021-08-10 DIAGNOSIS — E119 Type 2 diabetes mellitus without complications: Secondary | ICD-10-CM | POA: Diagnosis not present

## 2021-08-10 DIAGNOSIS — Z951 Presence of aortocoronary bypass graft: Secondary | ICD-10-CM | POA: Diagnosis not present

## 2021-08-10 DIAGNOSIS — F17210 Nicotine dependence, cigarettes, uncomplicated: Secondary | ICD-10-CM | POA: Diagnosis not present

## 2021-08-10 DIAGNOSIS — Z01818 Encounter for other preprocedural examination: Secondary | ICD-10-CM | POA: Diagnosis present

## 2021-08-10 DIAGNOSIS — C679 Malignant neoplasm of bladder, unspecified: Secondary | ICD-10-CM | POA: Insufficient documentation

## 2021-08-10 DIAGNOSIS — I5042 Chronic combined systolic (congestive) and diastolic (congestive) heart failure: Secondary | ICD-10-CM | POA: Insufficient documentation

## 2021-08-10 DIAGNOSIS — I255 Ischemic cardiomyopathy: Secondary | ICD-10-CM | POA: Insufficient documentation

## 2021-08-10 DIAGNOSIS — Z955 Presence of coronary angioplasty implant and graft: Secondary | ICD-10-CM | POA: Insufficient documentation

## 2021-08-10 DIAGNOSIS — Z9889 Other specified postprocedural states: Secondary | ICD-10-CM | POA: Insufficient documentation

## 2021-08-10 DIAGNOSIS — Z7984 Long term (current) use of oral hypoglycemic drugs: Secondary | ICD-10-CM | POA: Insufficient documentation

## 2021-08-10 DIAGNOSIS — I10 Essential (primary) hypertension: Secondary | ICD-10-CM

## 2021-08-10 DIAGNOSIS — I2510 Atherosclerotic heart disease of native coronary artery without angina pectoris: Secondary | ICD-10-CM | POA: Diagnosis not present

## 2021-08-10 HISTORY — DX: Malignant (primary) neoplasm, unspecified: C80.1

## 2021-08-10 LAB — CBC
HCT: 41.8 % (ref 39.0–52.0)
Hemoglobin: 14 g/dL (ref 13.0–17.0)
MCH: 31 pg (ref 26.0–34.0)
MCHC: 33.5 g/dL (ref 30.0–36.0)
MCV: 92.5 fL (ref 80.0–100.0)
Platelets: 165 10*3/uL (ref 150–400)
RBC: 4.52 MIL/uL (ref 4.22–5.81)
RDW: 13.4 % (ref 11.5–15.5)
WBC: 9.9 10*3/uL (ref 4.0–10.5)
nRBC: 0 % (ref 0.0–0.2)

## 2021-08-10 LAB — BASIC METABOLIC PANEL
Anion gap: 7 (ref 5–15)
BUN: 23 mg/dL (ref 8–23)
CO2: 23 mmol/L (ref 22–32)
Calcium: 8.8 mg/dL — ABNORMAL LOW (ref 8.9–10.3)
Chloride: 108 mmol/L (ref 98–111)
Creatinine, Ser: 0.61 mg/dL (ref 0.61–1.24)
GFR, Estimated: >60 mL/min (ref >60)
Glucose, Bld: 195 mg/dL — ABNORMAL HIGH (ref 70–99)
Potassium: 4.1 mmol/L (ref 3.5–5.1)
Sodium: 138 mmol/L (ref 135–145)

## 2021-08-10 LAB — GLUCOSE, CAPILLARY: Glucose-Capillary: 240 mg/dL — ABNORMAL HIGH (ref 70–99)

## 2021-08-10 LAB — HEMOGLOBIN A1C
Hgb A1c MFr Bld: 7.8 % — ABNORMAL HIGH (ref 4.8–5.6)
Mean Plasma Glucose: 177.16 mg/dL

## 2021-08-10 NOTE — H&P (Signed)
CC/HPI: cc: bladder cancer  ? ?Initial TURBT with gemcitabine 9/13  ?Restaging TURBT 10/18  ?Restaging TURBT 11/8  ?Clot evac 11/10  ?Induction BCG completed February 2023  ? ?03/01/21: 66 year old man with a history of high-grade T1 bladder cancer here for follow-up after second restaging TURBT followed by clot evacuation. His pathology report was reviewed by 2 different pathologist and no muscle invasive disease was identified. He is here today for voiding trial. He overall feels week following hospitalization and surgery last week.  ? ?07/24/21: 66 year old man with a history of high-grade T1 bladder cancer who recently completed induction BCG here for surveillance cystoscopy. Patient has put on 10 pounds in the last few months and overall feeling better. He denies any gross hematuria.  ? ?  ?ALLERGIES: Morphine Sulfate ?  ? ?MEDICATIONS: Aspirin 81 mg tablet,chewable  ?Plavix 75 mg tablet  ?Carvedilol  ?Glimepiride  ?Jardiance  ?Rosuvastatin Calcium  ?Valsartan  ?  ? ?GU PSH: Bladder Instill AntiCA Agent - 06/12/2021, 06/05/2021, 05/29/2021, 05/22/2021, 05/15/2021, 04/26/2021, 04/03/2021 ?Cystoscopy - 11/30/2020 ?Cystoscopy TURBT >5 cm - 02/20/2021 ?Cystoscopy TURBT 2-5 cm - 01/30/2021, 12/26/2020 ?Locm 300-'399Mg'$ /Ml Iodine,1Ml - 12/01/2020 ? ?  ?   ?PSH Notes: Bypass  ?  ? ?NON-GU PSH: Cardiac Stent Placement ? ?  ? ?GU PMH: Bladder Cancer overlapping sites - 06/12/2021, - 06/05/2021, - 05/22/2021, - 05/15/2021, - 04/26/2021, - 04/18/2021, - 04/03/2021, - 03/27/2021, - 03/01/2021, - 02/20/2021, - 02/12/2021, - 01/12/2021 ?Gross hematuria - 02/20/2021, - 12/01/2020, - 11/30/2020 ?Bladder tumor/neoplasm - 12/01/2020, - 11/30/2020 ?  ? ?NON-GU PMH: Cardiac murmur, unspecified ?Diabetes Type 2 ?Heart disease, unspecified ?Hypertension ?Myocardial Infarction ?Other heart failure ?  ? ?FAMILY HISTORY: 1 Daughter - Runs in Family ?1 son - Runs in Family ?Kidney Failure - Runs in Family  ? ?SOCIAL HISTORY: Marital Status: Unknown ?Current Smoking  Status: Patient does not smoke anymore. Has not smoked since 11/14/2010.  ? ?Tobacco Use Assessment Completed: Used Tobacco in last 30 days? ?Does drink.  ?Drinks 4+ caffeinated drinks per day. ?  ? ?REVIEW OF SYSTEMS:    ?GU Review Male:   Patient denies frequent urination, hard to postpone urination, burning/ pain with urination, get up at night to urinate, leakage of urine, stream starts and stops, trouble starting your stream, have to strain to urinate , erection problems, and penile pain.  ?Gastrointestinal (Upper):   Patient denies nausea, vomiting, and indigestion/ heartburn.  ?Gastrointestinal (Lower):   Patient denies diarrhea and constipation.  ?Constitutional:   Patient denies weight loss, fever, fatigue, and night sweats.  ?Skin:   Patient denies skin rash/ lesion and itching.  ?Eyes:   Patient denies blurred vision and double vision.  ?Ears/ Nose/ Throat:   Patient denies sore throat and sinus problems.  ?Hematologic/Lymphatic:   Patient denies swollen glands and easy bruising.  ?Cardiovascular:   Patient denies leg swelling and chest pains.  ?Respiratory:   Patient denies cough and shortness of breath.  ?Endocrine:   Patient denies excessive thirst.  ?Musculoskeletal:   Patient denies back pain and joint pain.  ?Neurological:   Patient denies headaches and dizziness.  ?Psychologic:   Patient denies depression and anxiety.  ? ?VITAL SIGNS: None  ? ?MULTI-SYSTEM PHYSICAL EXAMINATION:    ?Constitutional: Well-nourished. No physical deformities. Normally developed. Good grooming.  ?Neck: Neck symmetrical, not swollen. Normal tracheal position.  ?Respiratory: No labored breathing, no use of accessory muscles.   ?Skin: No paleness, no jaundice, no cyanosis. No lesion, no ulcer, no rash.  ?  Neurologic / Psychiatric: Oriented to time, oriented to place, oriented to person. No depression, no anxiety, no agitation.  ?Eyes: Normal conjunctivae. Normal eyelids.  ?Ears, Nose, Mouth, and Throat: Left ear no scars, no  lesions, no masses. Right ear no scars, no lesions, no masses. Nose no scars, no lesions, no masses. Normal hearing. Normal lips.  ?Musculoskeletal: Normal gait and station of head and neck.  ? ?  ?Complexity of Data:  ?Records Review:   Previous Patient Records, POC Tool  ?Urine Test Review:   Urinalysis  ? ?PROCEDURES:    ?     Flexible Cystoscopy - 52000  ?Risks, benefits, and some of the potential complications of the procedure were discussed at length with the patient including infection, bleeding, voiding discomfort, urinary retention, fever, chills, sepsis, and others. All questions were answered. Informed consent was obtained. Sterile technique and intraurethral analgesia were used.  ?Meatus:  Normal size. Normal location. Normal condition.  ?Urethra:  No strictures.  ?External Sphincter:  Normal.  ?Verumontanum:  Normal.  ?Prostate:  Non-obstructing. No hyperplasia.  ?Bladder Neck:  Non-obstructing.  ?Ureteral Orifices:  Normal location. Normal size. Normal shape. Effluxed clear urine.  ?Bladder:  Left lateral wall with early bladder tumor regrowth and some nonhealing areas concerning for bladder cancer  ?  ?  ?The lower urinary tract was carefully examined. The procedure was well-tolerated and without complications. Antibiotic instructions were given. Instructions were given to call the office immediately for bloody urine, difficulty urinating, urinary retention, painful or frequent urination, fever, chills, nausea, vomiting or other illness. The patient stated that he understood these instructions and would comply with them.  ? ? ?     Urinalysis w/Scope ?Dipstick Dipstick Cont'd Micro  ?Color: Yellow Bilirubin: Neg mg/dL WBC/hpf: 10 - 20/hpf  ?Appearance: Clear Ketones: Neg mg/dL RBC/hpf: 0 - 2/hpf  ?Specific Gravity: 1.020 Blood: Trace ery/uL Bacteria: NS (Not Seen)  ?pH: 6.5 Protein: Neg mg/dL Cystals: NS (Not Seen)  ?Glucose: 3+ mg/dL Urobilinogen: 0.2 mg/dL Casts: NS (Not Seen)  ?  Nitrites: Neg  Trichomonas: Not Present  ?  Leukocyte Esterase: Neg leu/uL Mucous: Not Present  ?    Epithelial Cells: NS (Not Seen)  ?    Yeast: NS (Not Seen)  ?    Sperm: Not Present  ? ? ?ASSESSMENT:  ?    ICD-10 Details  ?1 GU:   Bladder Cancer overlapping sites - C67.8 Chronic, Worsening  ? ?PLAN:    ? ?      Document ?Letter(s):  Created for Patient: Clinical Summary  ? ? ?     Notes:   Bladder cancer: Cystoscopy today with evidence of early recurrence/nonhealing areas. Discussed risk and benefits of cystoscopy with TURBT and intravesical gemcitabine with patient. Risks include but are not limited to pain, bleeding, infection, damage surrounding structures, need for future intervention, need for additional treatment, bladder perforation, need for indwelling Foley catheter. Patient has agreed and will be scheduled for next billable surgery date.  ?  ? ?

## 2021-08-13 NOTE — Anesthesia Preprocedure Evaluation (Addendum)
Anesthesia Evaluation  ?Patient identified by MRN, date of birth, ID band ?Patient awake ? ? ? ?Reviewed: ?Allergy & Precautions, NPO status , Patient's Chart, lab work & pertinent test results, reviewed documented beta blocker date and time  ? ?Airway ?Mallampati: III ? ?TM Distance: >3 FB ?Neck ROM: Full ? ? ? Dental ? ?(+) Edentulous Lower, Edentulous Upper ?  ?Pulmonary ?Current Smoker and Patient abstained from smoking.,  ?Current smoker, 25 pack year history, 1/2 ppd ?  ?Pulmonary exam normal ?breath sounds clear to auscultation ? ? ? ? ? ? Cardiovascular ?hypertension, Pt. on medications and Pt. on home beta blockers ?+ CAD, + Past MI, + Cardiac Stents (2011, 2013), + CABG (2011), + Peripheral Vascular Disease and +CHF (LVEF 35-40%)  ?Normal cardiovascular exam+ Valvular Problems/Murmurs (s/p cabg MVR 2011)  ?Rhythm:Regular Rate:Normal ? ?Most recent echo 2015? ?Echo 08/25/2013 ?Study Conclusions  ? ?- Left ventricle: Distal septal apical , mid and basal  ???inferior akinesis The cavity size was moderately dilated.  ???Wall thickness was normal. Systolic function was  ???moderately reduced. The estimated ejection fraction was in  ???the range of 35% to 40%.  ?- Mitral valve: S/P mitral valve ring? significant diastolic  ???gradient and mild residual MR Mild regurgitation. Valve  ???area by continuity equation (using LVOT flow): 1.41cm^2.  ?- Left atrium: The atrium was moderately dilated.  ?- Atrial septum: No defect or patent foramen ovale was  ???identified.  ?  ?Neuro/Psych ?negative neurological ROS ? negative psych ROS  ? GI/Hepatic ?negative GI ROS, Neg liver ROS,   ?Endo/Other  ?diabetes, Well Controlled, Type 2, Oral Hypoglycemic Agents ? Renal/GU ?negative Renal ROS  ? ?Bladder ca ? ?  ?Musculoskeletal ?negative musculoskeletal ROS ?(+)  ? Abdominal ?  ?Peds ? Hematology ?negative hematology ROS ?(+)   ?Anesthesia Other Findings ? ? Reproductive/Obstetrics ?negative  OB ROS ? ?  ? ? ? ? ? ? ? ? ? ? ? ? ? ?  ?  ? ? ? ? ? ? ? ?Anesthesia Physical ?Anesthesia Plan ? ?ASA: 3 ? ?Anesthesia Plan: General  ? ?Post-op Pain Management: Tylenol PO (pre-op)*  ? ?Induction: Intravenous ? ?PONV Risk Score and Plan: 2 and Ondansetron, Dexamethasone, Treatment may vary due to age or medical condition and Midazolam ? ?Airway Management Planned: Oral ETT ? ?Additional Equipment: None ? ?Intra-op Plan:  ? ?Post-operative Plan: Extubation in OR ? ?Informed Consent: I have reviewed the patients History and Physical, chart, labs and discussed the procedure including the risks, benefits and alternatives for the proposed anesthesia with the patient or authorized representative who has indicated his/her understanding and acceptance.  ? ? ? ?Dental advisory given ? ?Plan Discussed with: CRNA ? ?Anesthesia Plan Comments:   ? ? ? ? ? ?Anesthesia Quick Evaluation ? ?

## 2021-08-13 NOTE — Progress Notes (Signed)
Anesthesia Chart Review ? ? Case: [id number] Date/Time: 08/14/21 1200  ? Procedures:  ?    TRANSURETHRAL RESECTION OF BLADDER TUMOR WITH  GEMCITABINE - 75 MINS ?    CYSTOSCOPY  ? Anesthesia type: General  ? Pre-op diagnosis: BLADDER CANCER  ? Location: [facility]  ? Surgeons: Robley Fries, MD  ? ?  ? ? ?DISCUSSION:65 y.o. smoker with h/o CAD (CABG 2011, DES 2013), ischemic cardiomyopathy EF 35-40%, status post mitral valve repair, DM II, bladder cancer scheduled for above procedure 08/14/21 with Dr. Jacalyn Lefevre.  ? ?Pt last seen by cardiology. Stable at this visit.  ? ?Anticipate pt can proceed with planned procedure barring acute status change.   ?VS: BP 138/75   Pulse 85   Temp 36.9 ?C (Oral)   Resp 16   Ht '5\' 6"'$  (1.676 m)   Wt 68 kg   SpO2 98%   BMI 24.21 kg/m?  ? ?PROVIDERS: ?Jenel Lucks, PA-C is PCP  ? ?Primary Cardiologist:  Glenetta Hew, MD ? ?LABS: Labs reviewed: Acceptable for surgery. ?(all labs ordered are listed, but only abnormal results are displayed) ? ?Labs Reviewed  ?HEMOGLOBIN A1C - Abnormal; Notable for the following components:  ?    Result Value  ? Hgb A1c MFr Bld 7.8 (*)   ? All other components within normal limits  ?BASIC METABOLIC PANEL - Abnormal; Notable for the following components:  ? Glucose, Bld 195 (*)   ? Calcium 8.8 (*)   ? All other components within normal limits  ?GLUCOSE, CAPILLARY - Abnormal; Notable for the following components:  ? Glucose-Capillary 240 (*)   ? All other components within normal limits  ?CBC  ? ? ? ?IMAGES: ? ? ?EKG: ?08/10/2021 ?Rate 82 bpm  ?Normal sinus rhythm ?Non-specific intra-ventricular conduction delay ?Left ventricular hypertrophy with repolarization abnormality ( R in aVL , [employer] product ) ?Abnormal ECG ?When compared with ECG of 03-Aug-2011 04:48, ?PREVIOUS ECG IS PRESENT ?No significant change since last tracing ? ?CV: ?Echo 08/25/2013 ?Study Conclusions  ? ?- Left ventricle: Distal septal apical , mid  and basal  ?  inferior akinesis The cavity size was moderately dilated.  ?  Wall thickness was normal. Systolic function was  ?  moderately reduced. The estimated ejection fraction was in  ?  the range of 35% to 40%.  ?- Mitral valve: S/P mitral valve ring? significant diastolic  ?  gradient and mild residual MR Mild regurgitation. Valve  ?  area by continuity equation (using LVOT flow): 1.41cm^2.  ?- Left atrium: The atrium was moderately dilated.  ?- Atrial septum: No defect or patent foramen ovale was  ?  identified.  ?Past Medical History:  ?Diagnosis Date  ? CAD S/P percutaneous coronary angioplasty 03/2010 -- 3-07/2011  ? Proximal LAD ~80% (into D2) & mid - subtotal occlusion , RCA occlusion, LCx occlusion -- s/p CABG x 3  (LIMA-LAD, SVG-OM, SVG-RPDA); Cath 06/2011, patent grafts with severe proximal  LAD - D1 lesions -- 07/2011: PCI  proxLAD-into D2 with Promus Premier DES 2.5 mm x 38 mm (distal in D2 2.5 mm, @ bifurcation - 2.75 m, in prox LAD ~3.0 mm  ? [facility])   ? bladder  ? Carotid artery disease ([place])   ? moderate by 2017 duplex US  ? CHF (congestive heart failure) ([place])   ? Chronic combined systolic and diastolic HF (heart failure), NYHA class 2 ([place]) 03/2010; 08/2103  ? a) EF ~30-30%; Exertional Dyspnea;  No exaccerbations.;; b)EF 35-40%, Mod LA dilation  ? DM (diabetes mellitus) type II controlled peripheral vascular disorder 04/16/2011  ? & CAD  ? Heart murmur   ? History of kidney stones   ? HTN, goal below 130/80 03/15/2010  ? Hyperlipidemia LDL goal < 70   ? Ischemic cardiomyopathy 03/2010; May 2015  ? a) Echo - EF 30-40%; mild/mod anterior wall hypokinesis; doppler flow suggestive of impaired LV relaxation; LA moderately dilated; annuloplasty ring noted in mitral position; mild/mod mitral regurgitation; RVsystolic pressure elevated at 30-51mHg;;; b) Echo May 2015: EF 35-40%  ? NSTEMI (non-ST elevated myocardial infarction) [place] 03/15/2010  ? Complicated by Severe Acute Systolic CHF -- Class IV  CHF,  ? S/P CABG x 3 03/15/2010  ? LIMA-mLAD, SVG-OM, SVG-rPDA  ? 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 @ time of CABG for ischemic MR - 26 mm Edwards physio-2 annuloplasty ring  ? Tobacco dependence   ? Initially quit - restarted ~mid 2012; now essentially quit.  ? ? ?Past Surgical History:  ?Procedure Laterality Date  ? CORONARY ARTERY BYPASS GRAFT  03/2010  ? LIMA-LAD, SVG-OM, SVG-RPDA  ? FULGURATION OF BLADDER TUMOR N/A 02/22/2021  ? Procedure: CYSTOSCOPY/CLOT EVACTION/FULGURATION OF BLADDER TUMOR;  Surgeon: PRobley Fries MD;  Location: [facility];  Service: Urology;  Laterality: N/A;  ? Left Arm Surgery - NOS    ? LEFT HEART CATHETERIZATION WITH CORONARY/GRAFT ANGIOGRAM  07/03/2011  ? Procedure: LEFT HEART CATHETERIZATION WITH CBeatrix Fetters  Surgeon: DLeonie Man MD;  [facility];  Service: Cardiovascular;Patent LIMA-LAD, SVG-OM, SVG-RPDA; (native RCA & OM prox 100%, prox LAD into major D2 ~80% long lesion, midLAD 100%  ? MITRAL VALVULOPLASTY  03/2010  ? conjuntive with CABG for Severe MR; 26 mm Edwards physio-2 annuloplasty ring  ? NM MYOVIEW LTD  04/24/2011  ? R/L MV - EF 37%; mild perfusion defect due to attenuation w/mild to mod superimposed ischemia seen in apex, apical lateral and distal to mid ateroseptal region; LV systolic fcn moderately reduced; hypotensive BP response to stress w/o chest pain; high risk scan  ? PERCUTANEOUS CORONARY STENT INTERVENTION (PCI-S) N/A 08/02/2011  ? Procedure: PERCUTANEOUS CORONARY STENT INTERVENTION (PCI-S);  Surgeon: DLeonie Man MD;  [facility];  Service: Cardiovascular;  PCI proximal LAD-D2: Promus Premier DES 2.5 x 38 (tapered post-dilation from 3.0 mm in proximal LAD, 2.75 mm at bifurcation and 2.5 mm in D2)  ? TONSILLECTOMY    ? Childhood  ? TRANSTHORACIC ECHOCARDIOGRAM  03/23/2010   ? Pre-CABG/MVR: EF 25-30%; global HK; Mod-Severe MR  ? TRANSTHORACIC ECHOCARDIOGRAM  09/2011; 08/2013  ? a) EF up to 30-40% (post PCI);  mild-mod Ant wall HK; Gd 1 DD; MV Annuloplasty ring in place, mild-mod MR; mild PHTN (30-40 mmHg);; b)  EF 35-40% with moderately reduced function. Moderately dilated LV with distal septal-apical, mid and basal inferior akinesis; status post mitral valve ring. Significant diastolic gradient with mild MR. Moderate LA dilation.    ? TRANSURETHRAL RESECTION OF BLADDER TUMOR N/A 01/30/2021  ? Procedure: RESTAGING TRANSURETHRAL RESECTION OF BLADDER TUMOR (TURBT);  Surgeon: PRobley Fries MD;  Location: [facility];  Service: Urology;  Laterality: N/A;  1 HR  ? TRANSURETHRAL RESECTION OF BLADDER TUMOR N/A 02/20/2021  ? Procedure: TRANSURETHRAL RESECTION OF BLADDER TUMOR (TURBT), RESTAGING;  Surgeon: PRobley Fries MD;  Location: [facility];  Service: Urology;  Laterality: N/A;  GENERAL ANESTHESIA WITH PARALYSIS  ? TRANSURETHRAL RESECTION OF BLADDER TUMOR WITH MITOMYCIN-C N/A  12/26/2020  ? Procedure: TRANSURETHRAL RESECTION OF BLADDER TUMOR WITH GEMCITABINE;  Surgeon: Robley Fries, MD;  Location: [facility];  Service: Urology;  Laterality: N/A;  ? ? ?MEDICATIONS: ? Ascorbic Acid (VITAMIN C) 1000 MG tablet  ? aspirin EC 81 MG tablet  ? B Complex-C (SUPER B COMPLEX PO)  ? carvedilol (COREG) 12.5 MG tablet  ? Cholecalciferol (VITAMIN D-3) 125 MCG (5000 UT) TABS  ? clopidogrel (PLAVIX) 75 MG tablet  ? Echinacea 400 MG CAPS  ? ferrous sulfate 325 (65 FE) MG tablet  ? Garlic (GARLIQUE PO)  ? glimepiride (AMARYL) 4 MG tablet  ? ibuprofen (ADVIL) 200 MG tablet  ? JARDIANCE 25 MG TABS tablet  ? Magnesium 500 MG TABS  ? Multiple Vitamins-Minerals (MULTIVITAMINS THER. W/MINERALS) TABS  ? Omega-3 Fatty Acids (FISH OIL) 1000 MG CAPS  ? oxybutynin (DITROPAN) 5 MG tablet  ? rosuvastatin (CRESTOR) 40 MG tablet  ? Turmeric 500 MG CAPS  ? valsartan (DIOVAN) 80 MG tablet  ? vitamin E 1000 UNIT capsule  ? Zinc 50 MG TABS  ? ?No current facility-administered medications for this encounter.  ? ? ?Konrad Felix Ward, PA-C ?[facility] ?[phone] ? ? ? ? ? ? ?

## 2021-08-14 ENCOUNTER — Encounter (HOSPITAL_COMMUNITY)
Admission: RE | Disposition: A | Discharge status: Home / Self Care | Payer: Self-pay | Source: Ambulatory Visit | Attending: Urology

## 2021-08-14 ENCOUNTER — Ambulatory Visit (HOSPITAL_BASED_OUTPATIENT_CLINIC_OR_DEPARTMENT_OTHER): Payer: Medicare Other | Admitting: Certified Registered Nurse Anesthetist

## 2021-08-14 ENCOUNTER — Encounter (HOSPITAL_COMMUNITY): Payer: Self-pay | Admitting: Urology

## 2021-08-14 ENCOUNTER — Ambulatory Visit (HOSPITAL_COMMUNITY): Payer: Medicare Other | Admitting: Physician Assistant

## 2021-08-14 ENCOUNTER — Ambulatory Visit (HOSPITAL_COMMUNITY)
Admission: RE | Admit: 2021-08-14 | Discharge: 2021-08-14 | Disposition: A | Discharge status: Home / Self Care | Payer: Medicare Other | Source: Ambulatory Visit | Attending: Urology | Admitting: Urology

## 2021-08-14 DIAGNOSIS — E119 Type 2 diabetes mellitus without complications: Secondary | ICD-10-CM

## 2021-08-14 DIAGNOSIS — I2510 Atherosclerotic heart disease of native coronary artery without angina pectoris: Secondary | ICD-10-CM | POA: Insufficient documentation

## 2021-08-14 DIAGNOSIS — Z951 Presence of aortocoronary bypass graft: Secondary | ICD-10-CM | POA: Insufficient documentation

## 2021-08-14 DIAGNOSIS — E1151 Type 2 diabetes mellitus with diabetic peripheral angiopathy without gangrene: Secondary | ICD-10-CM | POA: Diagnosis not present

## 2021-08-14 DIAGNOSIS — D303 Benign neoplasm of bladder: Secondary | ICD-10-CM | POA: Insufficient documentation

## 2021-08-14 DIAGNOSIS — Z955 Presence of coronary angioplasty implant and graft: Secondary | ICD-10-CM | POA: Insufficient documentation

## 2021-08-14 DIAGNOSIS — Z79899 Other long term (current) drug therapy: Secondary | ICD-10-CM | POA: Insufficient documentation

## 2021-08-14 DIAGNOSIS — I10 Essential (primary) hypertension: Secondary | ICD-10-CM

## 2021-08-14 DIAGNOSIS — C679 Malignant neoplasm of bladder, unspecified: Secondary | ICD-10-CM | POA: Diagnosis not present

## 2021-08-14 DIAGNOSIS — Z8551 Personal history of malignant neoplasm of bladder: Secondary | ICD-10-CM | POA: Insufficient documentation

## 2021-08-14 DIAGNOSIS — Z7984 Long term (current) use of oral hypoglycemic drugs: Secondary | ICD-10-CM | POA: Insufficient documentation

## 2021-08-14 DIAGNOSIS — I110 Hypertensive heart disease with heart failure: Secondary | ICD-10-CM | POA: Diagnosis not present

## 2021-08-14 DIAGNOSIS — I509 Heart failure, unspecified: Secondary | ICD-10-CM | POA: Insufficient documentation

## 2021-08-14 DIAGNOSIS — I252 Old myocardial infarction: Secondary | ICD-10-CM | POA: Insufficient documentation

## 2021-08-14 DIAGNOSIS — D494 Neoplasm of unspecified behavior of bladder: Secondary | ICD-10-CM | POA: Diagnosis present

## 2021-08-14 HISTORY — PX: CYSTOSCOPY: SHX5120

## 2021-08-14 HISTORY — PX: TRANSURETHRAL RESECTION OF BLADDER TUMOR: SHX2575

## 2021-08-14 LAB — GLUCOSE, CAPILLARY
Glucose-Capillary: 183 mg/dL — ABNORMAL HIGH (ref 70–99)
Glucose-Capillary: 183 mg/dL — ABNORMAL HIGH (ref 70–99)

## 2021-08-14 SURGERY — TURBT (TRANSURETHRAL RESECTION OF BLADDER TUMOR)
Anesthesia: General | Site: Urethra

## 2021-08-14 MED ORDER — DEXAMETHASONE SODIUM PHOSPHATE 10 MG/ML IJ SOLN
INTRAMUSCULAR | Status: DC | PRN
  Administered 2021-08-14: 10 mg via INTRAVENOUS

## 2021-08-14 MED ORDER — FENTANYL CITRATE (PF) 100 MCG/2ML IJ SOLN
INTRAMUSCULAR | Status: AC
  Filled 2021-08-14: qty 2

## 2021-08-14 MED ORDER — ORAL CARE MOUTH RINSE: 15.0000 mL | Freq: Once | OROMUCOSAL | Status: AC

## 2021-08-14 MED ORDER — CEFAZOLIN SODIUM-DEXTROSE 2-4 GM/100ML-% IV SOLN
2.0000 g | INTRAVENOUS | Status: AC
  Administered 2021-08-14: 2 g via INTRAVENOUS
  Filled 2021-08-14: qty 100

## 2021-08-14 MED ORDER — HYDROCODONE-ACETAMINOPHEN 5-325 MG PO TABS: 1.0000 | ORAL_TABLET | Freq: Four times a day (QID) | ORAL | 0 refills | Status: DC | PRN

## 2021-08-14 MED ORDER — HYDROMORPHONE HCL 1 MG/ML IJ SOLN: 0.2500 mg | INTRAMUSCULAR | Status: DC | PRN

## 2021-08-14 MED ORDER — LACTATED RINGERS IV SOLN: INTRAVENOUS | Status: DC

## 2021-08-14 MED ORDER — ONDANSETRON HCL 4 MG/2ML IJ SOLN: 4.0000 mg | Freq: Once | INTRAMUSCULAR | Status: DC | PRN

## 2021-08-14 MED ORDER — ALBUTEROL SULFATE HFA 108 (90 BASE) MCG/ACT IN AERS
INHALATION_SPRAY | RESPIRATORY_TRACT | Status: AC
  Filled 2021-08-14: qty 6.7

## 2021-08-14 MED ORDER — SODIUM CHLORIDE 0.9 % IR SOLN
Status: DC | PRN
Start: 2021-08-14 — End: 2021-08-14
  Administered 2021-08-14: 3000 mL

## 2021-08-14 MED ORDER — LIDOCAINE HCL (PF) 2 % IJ SOLN
INTRAMUSCULAR | Status: AC
  Filled 2021-08-14: qty 5

## 2021-08-14 MED ORDER — CHLORHEXIDINE GLUCONATE 0.12 % MT SOLN
15.0000 mL | Freq: Once | OROMUCOSAL | Status: AC
  Administered 2021-08-14: 15 mL via OROMUCOSAL

## 2021-08-14 MED ORDER — SUGAMMADEX SODIUM 200 MG/2ML IV SOLN
INTRAVENOUS | Status: DC | PRN
  Administered 2021-08-14: 300 mg via INTRAVENOUS

## 2021-08-14 MED ORDER — ROCURONIUM BROMIDE 10 MG/ML (PF) SYRINGE
PREFILLED_SYRINGE | INTRAVENOUS | Status: DC | PRN
  Administered 2021-08-14: 70 mg via INTRAVENOUS

## 2021-08-14 MED ORDER — MIDAZOLAM HCL 2 MG/2ML IJ SOLN
INTRAMUSCULAR | Status: AC
  Filled 2021-08-14: qty 2

## 2021-08-14 MED ORDER — 0.9 % SODIUM CHLORIDE (POUR BTL) OPTIME
TOPICAL | Status: DC | PRN
  Administered 2021-08-14: 1000 mL

## 2021-08-14 MED ORDER — ALBUTEROL SULFATE HFA 108 (90 BASE) MCG/ACT IN AERS
INHALATION_SPRAY | RESPIRATORY_TRACT | Status: DC | PRN
  Administered 2021-08-14: 2 via RESPIRATORY_TRACT

## 2021-08-14 MED ORDER — PROPOFOL 10 MG/ML IV BOLUS
INTRAVENOUS | Status: AC
Start: 2021-08-14 — End: ?
  Filled 2021-08-14: qty 20

## 2021-08-14 MED ORDER — ROCURONIUM BROMIDE 10 MG/ML (PF) SYRINGE
PREFILLED_SYRINGE | INTRAVENOUS | Status: AC
  Filled 2021-08-14: qty 10

## 2021-08-14 MED ORDER — OXYCODONE HCL 5 MG/5ML PO SOLN: 5.0000 mg | Freq: Once | ORAL | Status: AC | PRN

## 2021-08-14 MED ORDER — MIDAZOLAM HCL 5 MG/5ML IJ SOLN
INTRAMUSCULAR | Status: DC | PRN
  Administered 2021-08-14: 2 mg via INTRAVENOUS

## 2021-08-14 MED ORDER — LIDOCAINE HCL (CARDIAC) PF 100 MG/5ML IV SOSY
PREFILLED_SYRINGE | INTRAVENOUS | Status: DC | PRN
  Administered 2021-08-14: 80 mg via INTRAVENOUS

## 2021-08-14 MED ORDER — PROPOFOL 10 MG/ML IV BOLUS
INTRAVENOUS | Status: DC | PRN
  Administered 2021-08-14: 150 mg via INTRAVENOUS

## 2021-08-14 MED ORDER — OXYCODONE HCL 5 MG PO TABS
ORAL_TABLET | ORAL | Status: AC
  Filled 2021-08-14: qty 1

## 2021-08-14 MED ORDER — ACETAMINOPHEN 500 MG PO TABS
1000.0000 mg | ORAL_TABLET | Freq: Once | ORAL | Status: AC
  Administered 2021-08-14: 1000 mg via ORAL
  Filled 2021-08-14: qty 2

## 2021-08-14 MED ORDER — FENTANYL CITRATE (PF) 100 MCG/2ML IJ SOLN
INTRAMUSCULAR | Status: DC | PRN
  Administered 2021-08-14 (×2): 50 ug via INTRAVENOUS

## 2021-08-14 MED ORDER — AMISULPRIDE (ANTIEMETIC) 5 MG/2ML IV SOLN: 10.0000 mg | Freq: Once | INTRAVENOUS | Status: DC | PRN

## 2021-08-14 MED ORDER — OXYCODONE HCL 5 MG PO TABS
5.0000 mg | ORAL_TABLET | Freq: Once | ORAL | Status: AC | PRN
  Administered 2021-08-14: 5 mg via ORAL

## 2021-08-14 MED ORDER — ONDANSETRON HCL 4 MG/2ML IJ SOLN
INTRAMUSCULAR | Status: DC | PRN
  Administered 2021-08-14: 4 mg via INTRAVENOUS

## 2021-08-14 SURGICAL SUPPLY — 19 items
BAG DRN RND TRDRP ANRFLXCHMBR (UROLOGICAL SUPPLIES)
BAG URINE DRAIN 2000ML AR STRL (UROLOGICAL SUPPLIES) IMPLANT
BAG URO CATCHER STRL LF (MISCELLANEOUS) ×4 IMPLANT
CATH URETL OPEN END 6FR 70 (CATHETERS) ×4 IMPLANT
CLOTH BEACON ORANGE TIMEOUT ST (SAFETY) ×4 IMPLANT
DRAPE FOOT SWITCH (DRAPES) ×4 IMPLANT
ELECT REM PT RETURN 15FT ADLT (MISCELLANEOUS) ×4 IMPLANT
GLOVE BIO SURGEON STRL SZ 6.5 (GLOVE) ×6 IMPLANT
GOWN STRL REUS W/ TWL LRG LVL3 (GOWN DISPOSABLE) ×4 IMPLANT
GOWN STRL REUS W/TWL LRG LVL3 (GOWN DISPOSABLE) ×6
GUIDEWIRE STR DUAL SENSOR (WIRE) ×4 IMPLANT
KIT TURNOVER KIT A (KITS) ×2 IMPLANT
LOOP CUT BIPOLAR 24F LRG (ELECTROSURGICAL) ×2 IMPLANT
MANIFOLD NEPTUNE II (INSTRUMENTS) ×4 IMPLANT
PACK CYSTO (CUSTOM PROCEDURE TRAY) ×4 IMPLANT
SYR TOOMEY IRRIG 70ML (MISCELLANEOUS)
SYRINGE TOOMEY IRRIG 70ML (MISCELLANEOUS) IMPLANT
TUBING CONNECTING 10 (TUBING) ×4 IMPLANT
TUBING UROLOGY SET (TUBING) ×4 IMPLANT

## 2021-08-14 NOTE — Discharge Instructions (Addendum)
Transurethral Resection of Bladder Tumor (TURBT) ? ? ?Definition: ? Transurethral Resection of the Bladder Tumor is a surgical procedure used to diagnose and remove tumors within the bladder. TURBT is the most common treatment for early stage bladder cancer. ? ?General instructions: ?   ? Your recent bladder surgery requires very little post hospital care but some definite precautions. ? ?Despite the fact that no skin incisions were used, the area around the bladder incisions are raw and covered with scabs to promote healing and prevent bleeding. Certain precautions are needed to insure that the scabs are not disturbed over the next 2-4 weeks while the healing proceeds. ? ?Because the raw surface inside your bladder and the irritating effects of urine you may expect frequency of urination and/or urgency (a stronger desire to urinate) and perhaps even getting up at night more often. This will usually resolve or improve slowly over the healing period. You may see some blood in your urine over the first 6 weeks. Do not be alarmed, even if the urine was clear for a while. Get off your feet and drink lots of fluids until clearing occurs. If you start to pass clots or don't improve call us. ? ?Catheter: (If you are discharged with a catheter.) ? ?1. Keep your catheter secured to your leg at all times with tape or the supplied strap. ?2. You may experience leakage of urine around your catheter- as long as the  ?catheter continues to drain, this is normal.  If your catheter stops draining  ?go to the ER. ?3. You may also have blood in your urine, even after it has been clear for  ?several days; you may even pass some small blood clots or other material.  This  ?is normal as well.  If this happens, sit down and drink plenty of water to help  ?make urine to flush out your bladder.  If the blood in your urine becomes worse  ?after doing this, contact our office or return to the ER. ?4. You may use the leg bag (small bag)  during the day, but use the large bag at  ?night. ? ?Diet: ? ?You may return to your normal diet immediately. Because of the raw surface of your bladder, alcohol, spicy foods, foods high in acid and drinks with caffeine may cause irritation or frequency and should be used in moderation. To keep your urine flowing freely and avoid constipation, drink plenty of fluids during the day (8-10 glasses). Tip: Avoid cranberry juice because it is very acidic. ? ?Activity: ? ?Your physical activity doesn't need to be restricted. However, if you are very active, you may see some blood in the urine. We suggest that you reduce your activity under the circumstances until the bleeding has stopped. ? ?Bowels: ? ?It is important to keep your bowels regular during the postoperative period. Straining with bowel movements can cause bleeding. A bowel movement every other day is reasonable. Use a mild laxative if needed, such as milk of magnesia 2-3 tablespoons, or 2 Dulcolax tablets. Call if you continue to have problems. If you had been taking narcotics for pain, before, during or after your surgery, you may be constipated. Take a laxative if necessary. ? ? ? ?Medication: ? ?You should resume your pre-surgery medications unless told not to. In addition you may be given an antibiotic to prevent or treat infection. Antibiotics are not always necessary. All medication should be taken as prescribed until the bottles are finished unless you are having  an unusual reaction to one of the drugs. ? ?Plavix - do not resume until 2 days after surgery (Thursday) as long as urine is clear. If urine is bloody please call the office. ?AZO - This can be used for burning with urination after surgery.  It is over the counter.  ?

## 2021-08-14 NOTE — Anesthesia Postprocedure Evaluation (Signed)
Anesthesia Post Note ? ?Patient: Eric Bradshaw ? ?Procedure(s) Performed: TRANSURETHRAL RESECTION OF BLADDER TUMOR (Urethra) ?CYSTOSCOPY (Urethra) ? ?  ? ?Patient location during evaluation: PACU ?Anesthesia Type: General ?Level of consciousness: awake and alert, oriented and patient cooperative ?Pain management: pain level controlled ?Vital Signs Assessment: post-procedure vital signs reviewed and stable ?Respiratory status: spontaneous breathing, nonlabored ventilation and respiratory function stable ?Cardiovascular status: blood pressure returned to baseline and stable ?Postop Assessment: no apparent nausea or vomiting ?Anesthetic complications: no ? ? ?No notable events documented. ? ?Last Vitals:  ?Vitals:  ? 08/14/21 1304 08/14/21 1318  ?BP:  (!) 146/72  ?Pulse: 87 83  ?Resp: 14 16  ?Temp:  36.6 ?C  ?SpO2: 94% 93%  ?  ?Last Pain:  ?Vitals:  ? 08/14/21 1318  ?TempSrc:   ?PainSc: 0-No pain  ? ? ?  ?  ?  ?  ?  ?  ? ?Jarome Matin Nils Thor ? ? ? ? ?

## 2021-08-14 NOTE — Interval H&P Note (Signed)
History and Physical Interval Note: ?Risks and benefits of intravesical gemcitabine discussed with pt. I will decided intraoperatively whether or not to proceed with this.  ? ?08/14/2021 ?11:14 AM ? ?Eric Bradshaw  has presented today for surgery, with the diagnosis of BLADDER CANCER.  The various methods of treatment have been discussed with the patient and family. After consideration of risks, benefits and other options for treatment, the patient has consented to  Procedure(s) with comments: ?TRANSURETHRAL RESECTION OF BLADDER TUMOR WITH  GEMCITABINE (N/A) - 75 MINS ?CYSTOSCOPY (N/A) as a surgical intervention.  The patient's history has been reviewed, patient examined, no change in status, stable for surgery.  I have reviewed the patient's chart and labs.  Questions were answered to the patient's satisfaction.   ? ? ?Jaylise Peek D Maram Bently ? ? ?

## 2021-08-14 NOTE — Op Note (Signed)
PATIENT:  Eric Bradshaw ? ?PRE-OPERATIVE DIAGNOSIS: Bladder cancer ? ?POST-OPERATIVE DIAGNOSIS: Same ? ?PROCEDURE:  Procedure(s): ?1. TRANSURETHRAL RESECTION OF BLADDER TUMOR (TURBT) (2cm.) ? ? ?SURGEON:  Jacalyn Lefevre, MD ? ?ANESTHESIA:   General ? ?EBL:  Minimal ? ?DRAINS: none ? ?SPECIMEN:  Left lateral wall bladder tumor ? ?Findings: ?Normal anterior urethra ?Prostatic lateral lobe hypertrophy ?Bilateral orthotopic ureteral orifices effluxing clear yellow urine ?Area of prior bladder tumor at left superior lateral wall with erythema and oozing concerning for recurrence; also with white exudate concerning for slowly healing resection site ?Hemostasis adequate with irrigant turned off ? ?DISPOSITION OF SPECIMEN:  PATHOLOGY ? ?Indication: 66 year old man with a history of high-grade T1 bladder cancer found to have concern for recurrence on surveillance cystoscopy. ? ?Description of operation: The patient was taken to the operating room and administered general anesthesia. They were then placed on the table and moved to the dorsal lithotomy position after which the genitalia was sterilely prepped and draped. An official timeout was then performed. ? ?The 66 French resectoscope with the 30? lens and visual obturator were then passed into the bladder under direct visualization. Urethra appeared normal. The visual obturator was then removed and the Gyrus resectoscope element with 30 ? lens was then inserted and the bladder was fully and systematically inspected. Ureteral orifices were noted to be in the normal anatomic positions.  ? ?The left lateral wall area of concern was then resected with bipolar electrocautery loop.  Fulguration then took place to achieve hemostasis. ? ?Reinspection of the bladder revealed all obvious tumor had been fully resected and there was no evidence of perforation. The Daylene Posey was then used to irrigate the bladder and remove all of the portions of bladder tumor which were sent to  pathology. I then removed the resectoscope. ? ?The patient emerged from anesthesia and sent to the PACU in stable condition.  ? ?PLAN OF CARE: Discharge to home after PACU.  Due to poorly healing mucosa and size of fulguration decision was made not to proceed with intravesical gemcitabine. ? ?PATIENT DISPOSITION:  PACU - hemodynamically stable.  ?

## 2021-08-14 NOTE — Transfer of Care (Signed)
Immediate Anesthesia Transfer of Care Note ? ?Patient: Eric Bradshaw ? ?Procedure(s) Performed: TRANSURETHRAL RESECTION OF BLADDER TUMOR WITH  GEMCITABINE (Urethra) ?CYSTOSCOPY (Urethra) ? ?Patient Location: PACU ? ?Anesthesia Type:General ? ?Level of Consciousness: awake, alert  and oriented ? ?Airway & Oxygen Therapy: Patient Spontanous Breathing and Patient connected to face mask ? ?Post-op Assessment: Report given to RN and Post -op Vital signs reviewed and stable ? ?Post vital signs: Reviewed and stable ? ?Last Vitals:  ?Vitals Value Taken Time  ?BP    ?Temp    ?Pulse 81 08/14/21 1234  ?Resp    ?SpO2 94 % 08/14/21 1234  ?Vitals shown include unvalidated device data. ? ?Last Pain:  ?Vitals:  ? 08/14/21 1020  ?TempSrc:   ?PainSc: 0-No pain  ?   ? ?  ? ?Complications: No notable events documented. ?

## 2021-08-14 NOTE — Anesthesia Procedure Notes (Signed)
Procedure Name: Intubation ?Date/Time: 08/14/2021 12:04 PM ?Performed by: Rosaland Lao, CRNA ?Pre-anesthesia Checklist: Patient identified, Emergency Drugs available, Suction available and Patient being monitored ?Patient Re-evaluated:Patient Re-evaluated prior to induction ?Oxygen Delivery Method: Circle system utilized ?Preoxygenation: Pre-oxygenation with 100% oxygen ?Induction Type: IV induction ?Ventilation: Oral airway inserted - appropriate to patient size ?Laryngoscope Size: Mac and 4 ?Grade View: Grade I ?Tube type: Oral ?Tube size: 7.5 mm ?Number of attempts: 1 ?Airway Equipment and Method: Stylet and Oral airway ?Placement Confirmation: ETT inserted through vocal cords under direct vision, positive ETCO2 and breath sounds checked- equal and bilateral ?Secured at: 22 cm ?Tube secured with: Tape ?Dental Injury: Teeth and Oropharynx as per pre-operative assessment  ? ? ? ? ?

## 2021-08-15 ENCOUNTER — Other Ambulatory Visit: Payer: Self-pay | Admitting: Cardiology

## 2021-08-15 ENCOUNTER — Encounter (HOSPITAL_COMMUNITY): Payer: Self-pay | Admitting: Urology

## 2021-08-16 LAB — SURGICAL PATHOLOGY

## 2022-01-30 ENCOUNTER — Other Ambulatory Visit: Payer: Self-pay | Admitting: Urology

## 2022-02-05 ENCOUNTER — Encounter (HOSPITAL_COMMUNITY): Payer: Self-pay

## 2022-02-05 NOTE — Patient Instructions (Signed)
SURGICAL WAITING ROOM VISITATION Patients having surgery or a procedure may have no more than 2 support people in the waiting area - these visitors may rotate.   Children under the age of 42 must have an adult with them who is not the patient. If the patient needs to stay at the hospital during part of their recovery, the visitor guidelines for inpatient rooms apply. Pre-op nurse will coordinate an appropriate time for 1 support person to accompany patient in pre-op.  This support person may not rotate.    Please refer to the [facility] website for the visitor guidelines for Inpatients (after your surgery is over and you are in a regular room).      Your procedure is scheduled on: 02-12-22   Report to [facility] Main Entrance    Report to admitting at 9:45 AM   Call this number if you have problems the morning of surgery [phone]   Do not eat food or drink liquids :After Midnight.          If you have questions, please contact your surgeon's office.   FOLLOW  ANY ADDITIONAL PRE OP INSTRUCTIONS YOU RECEIVED FROM YOUR SURGEON'S OFFICE!!!     Oral Hygiene is also important to reduce your risk of infection.                                    Remember - BRUSH YOUR TEETH THE MORNING OF SURGERY WITH YOUR REGULAR TOOTHPASTE   Do NOT smoke after Midnight   Take these medicines the morning of surgery with A SIP OF WATER:   Carvedilol  How to Manage Your Diabetes Before and After Surgery  Why is it important to control my blood sugar before and after surgery? Improving blood sugar levels before and after surgery helps healing and can limit problems. A way of improving blood sugar control is eating a healthy diet by:  Eating less sugar and carbohydrates  Increasing activity/exercise  Talking with your doctor about reaching your blood sugar goals High blood sugars (greater than 180 mg/dL) can raise your risk of infections and slow your recovery, so you will need to focus on  controlling your diabetes during the weeks before surgery. Make sure that the doctor who takes care of your diabetes knows about your planned surgery including the date and location.  How do I manage my blood sugar before surgery? Check your blood sugar at least 4 times a day, starting 2 days before surgery, to make sure that the level is not too high or low. Check your blood sugar the morning of your surgery when you wake up and every 2 hours until you get to the Short Stay unit. If your blood sugar is less than 70 mg/dL, you will need to treat for low blood sugar: Do not take insulin. Treat a low blood sugar (less than 70 mg/dL) with  cup of clear juice (cranberry or apple), 4 glucose tablets, OR glucose gel. Recheck blood sugar in 15 minutes after treatment (to make sure it is greater than 70 mg/dL). If your blood sugar is not greater than 70 mg/dL on recheck, call [phone] for further instructions. Report your blood sugar to the short stay nurse when you get to Short Stay.  If you are admitted to the hospital after surgery: Your blood sugar will be checked by the staff and you will probably be given insulin after  surgery (instead of oral diabetes medicines) to make sure you have good blood sugar levels. The goal for blood sugar control after surgery is 80-180 mg/dL.   WHAT DO I DO ABOUT MY DIABETES MEDICATION?  Do not take oral diabetes medicines (pills) the morning of surgery.  Hold Jardiance 3 days before surgery (Do not take Jardiance after 02/08/22)      DO NOT TAKE THE FOLLOWING 7 DAYS PRIOR TO SURGERY: Ozempic, Wegovy, Rybelsus (Semaglutide), Byetta (exenatide), Bydureon (exenatide ER), Victoza, Saxenda (liraglutide), or Trulicity (dulaglutide) Mounjaro (Tirzepatide) Adlyxin (Lixisenatide), Polyethylene Glycol Loxenatide.  Reviewed and Endorsed by [facility] Patient Education Committee, August 2015                              You may not have any metal on your body  including  jewelry, and body piercing             Do not wear lotions, powders, cologne, or deodorant              Men may shave face and neck.   Do not bring valuables to the hospital. [place].   Contacts, dentures or bridgework may not be worn into surgery.  DO NOT [place]. PHARMACY WILL DISPENSE MEDICATIONS LISTED ON YOUR MEDICATION LIST TO YOU DURING YOUR ADMISSION [place]!    Patients discharged on the day of surgery will not be allowed to drive home.  Someone NEEDS to stay with you for the first 24 hours after anesthesia.               Please read over the following fact sheets you were given: IF [place]  If you received a COVID test during your pre-op visit  it is requested that you wear a mask when out in public, stay away from anyone that may not be feeling well and notify your surgeon if you develop symptoms. If you test positive for Covid or have been in contact with anyone that has tested positive in the last 10 days please notify you surgeon.  [place] - Preparing for Surgery Before surgery, you can play an important role.  Because skin is not sterile, your skin needs to be as free of germs as possible.  You can reduce the number of germs on your skin by washing with CHG (chlorahexidine gluconate) soap before surgery.  CHG is an antiseptic cleaner which kills germs and bonds with the skin to continue killing germs even after washing. Please DO NOT use if you have an allergy to CHG or antibacterial soaps.  If your skin becomes reddened/irritated stop using the CHG and inform your nurse when you arrive at Short Stay. Do not shave (including legs and underarms) for at least 48 hours prior to the first CHG shower.  You may shave your face/neck.  Please follow these instructions carefully:  1.  Shower with CHG Soap the night  before surgery and the  morning of surgery.  2.  If you choose to wash your hair, wash your hair first as usual with your normal  shampoo.  3.  After you shampoo, rinse your hair and body thoroughly to remove the shampoo.  4.  Use CHG as you would any other liquid soap.  You can apply chg directly to the skin and wash.  Gently with a scrungie or clean washcloth.  5.  Apply the CHG Soap to your body ONLY FROM THE NECK DOWN.   Do   not use on face/ open                           Wound or open sores. Avoid contact with eyes, ears mouth and   genitals (private parts).                       Wash face,  Genitals (private parts) with your normal soap.             6.  Wash thoroughly, paying special attention to the area where your    surgery  will be performed.  7.  Thoroughly rinse your body with warm water from the neck down.  8.  DO NOT shower/wash with your normal soap after using and rinsing off the CHG Soap.                9.  Pat yourself dry with a clean towel.            10.  Wear clean pajamas.            11.  Place clean sheets on your bed the night of your first shower and do not  sleep with pets. Day of Surgery : Do not apply any lotions/deodorants the morning of surgery.  Please wear clean clothes to the hospital/surgery center.  FAILURE TO FOLLOW THESE INSTRUCTIONS MAY RESULT IN THE CANCELLATION OF YOUR SURGERY  PATIENT SIGNATURE_________________________________  NURSE SIGNATURE__________________________________  ________________________________________________________________________

## 2022-02-05 NOTE — Progress Notes (Signed)
COVID Vaccine Completed:  Yes  Date of COVID positive in last 90 days:  PCP - Cathi Roan, PA-C Cardiologist - [place] Hew  Chest x-ray -  EKG - 08-10-21 Epic Stress Test - 2013 Epic ECHO - 2015 Epic Cardiac Cath -  Pacemaker/ICD device last checked: Spinal Cord Stimulator:  Bowel Prep -   Sleep Study -  CPAP -   Fasting Blood Sugar -  Checks Blood Sugar _____ times a day  Blood Thinner Instructions: Plavix Aspirin Instructions:  ASA 81  Last Dose:  Activity level:  Can go up a flight of stairs and perform activities of daily living without stopping and without symptoms of chest pain or shortness of breath.  Able to exercise without symptoms  Unable to go up a flight of stairs without symptoms of     Anesthesia review:  Cardiomyopathy, CAD, Hx of MI, CHF, murmur, HTN, DM.  S/P CABG and MVR  Patient denies shortness of breath, fever, cough and chest pain at PAT appointment  Patient verbalized understanding of instructions that were given to them at the PAT appointment. Patient was also instructed that they will need to review over the PAT instructions again at home before surgery.

## 2022-02-06 NOTE — H&P (Signed)
/HPI: cc: bladder cancer   Initial TURBT with gemcitabine 9/13  Restaging TURBT 10/18  Restaging TURBT 11/8  Clot evac 11/10  Induction BCG completed February 2023  Maintenance BCG June 2023   03/01/21: 66 year old man with a history of high-grade T1 bladder cancer here for follow-up after second restaging TURBT followed by clot evacuation. His pathology report was reviewed by 2 different pathologist and no muscle invasive disease was identified. He is here today for voiding trial. He overall feels week following hospitalization and surgery last week.   07/24/21: 66 year old man with a history of high-grade T1 bladder cancer who recently completed induction BCG here for surveillance cystoscopy. Patient has put on 10 pounds in the last few months and overall feeling better. He denies any gross hematuria.   08/22/21: 66 year old man with a history of high-grade T1 bladder cancer s/p TURBT for concern for recurrence. Pathology benign. Patient is still voiding light pink urine but no clots.   12/07/21: 66 year old man with a history of high-grade T1 bladder cancer here for surveillance. His last maintenance BCG was completed in June 2023. He is working on cutting back on smoking but when he is anxious he does smoke more. No gross hematuria.   01/25/2022: 66 year old man with a history of high-grade T1 bladder cancer here for surveillance. He just finished maintenance BCG. He is overall feeling good. At last cystoscopy in August 2023 there was a poorly healed area on the left posterior bladder wall. We discussed biopsying it versus repeat surveillance cystoscopy in a month.     ALLERGIES: Morphine Sulfate    MEDICATIONS: Aspirin 81 mg tablet,chewable  Plavix 75 mg tablet  Carvedilol  Glimepiride  Jardiance  Rosuvastatin Calcium  Valsartan     GU PSH: Bladder Instill AntiCA Agent - 01/11/2022, 01/01/2022, 12/24/2021, 10/03/2021, 09/25/2021, 09/11/2021, 06/12/2021, 06/05/2021, 05/29/2021, 05/22/2021,  05/15/2021, 04/26/2021, 04/03/2021 Cystoscopy - 12/07/2021, 07/24/2021, 11/30/2020 Cystoscopy TURBT <2 cm - 08/14/2021 Cystoscopy TURBT >5 cm - 02/20/2021 Cystoscopy TURBT 2-5 cm - 01/30/2021, 12/26/2020 Locm 300-'399Mg'$ /Ml Iodine,1Ml - 12/01/2020       PSH Notes: Bypass     NON-GU PSH: Cardiac Stent Placement     GU PMH: Bladder Cancer overlapping sites - 01/11/2022, - 01/01/2022, - 12/24/2021, - 12/07/2021, - 10/03/2021, - 09/25/2021, - 09/18/2021, - 09/11/2021, - 08/22/2021, - 07/24/2021, - 06/12/2021, - 06/05/2021, - 05/22/2021, - 05/15/2021, - 04/26/2021, - 04/18/2021, - 04/03/2021, - 03/27/2021, - 03/01/2021, - 02/20/2021, - 02/12/2021, - 01/12/2021 Gross hematuria - 02/20/2021, - 12/01/2020, - 11/30/2020 Bladder tumor/neoplasm - 12/01/2020, - 11/30/2020    NON-GU PMH: Tobacco abuse counseling - 08/22/2021 Cardiac murmur, unspecified Diabetes Type 2 Heart disease, unspecified Hypertension Myocardial Infarction Other heart failure    FAMILY HISTORY: 1 Daughter - Runs in Family 1 son - Runs in Family Kidney Failure - Runs in Family   SOCIAL HISTORY: Marital Status: Unknown Current Smoking Status: Patient does not smoke anymore. Has not smoked since 11/14/2010.   Tobacco Use Assessment Completed: Used Tobacco in last 30 days? Does drink.  Drinks 4+ caffeinated drinks per day.    REVIEW OF SYSTEMS:    GU Review Male:   Patient denies frequent urination, hard to postpone urination, burning/ pain with urination, get up at night to urinate, leakage of urine, stream starts and stops, trouble starting your stream, have to strain to urinate , erection problems, and penile pain.  Gastrointestinal (Upper):   Patient denies nausea, vomiting, and indigestion/ heartburn.  Gastrointestinal (Lower):   Patient denies diarrhea and constipation.  Constitutional:   Patient denies fever, night sweats, weight loss, and fatigue.  Skin:   Patient denies skin rash/ lesion and itching.  Eyes:   Patient denies blurred vision and  double vision.  Ears/ Nose/ Throat:   Patient denies sore throat and sinus problems.  Hematologic/Lymphatic:   Patient denies swollen glands and easy bruising.  Cardiovascular:   Patient denies leg swelling and chest pains.  Respiratory:   Patient denies cough and shortness of breath.  Endocrine:   Patient denies excessive thirst.  Musculoskeletal:   Patient denies back pain and joint pain.  Neurological:   Patient denies dizziness and headaches.  Psychologic:   Patient denies depression and anxiety.   VITAL SIGNS:      01/25/2022 10:48 AM  Weight 157 lb / 71.21 kg  BP 172/77 mmHg  Pulse 91 /min  Temperature 97.8 F / 36.5 C   MULTI-SYSTEM PHYSICAL EXAMINATION:    Constitutional: Well-nourished. No physical deformities. Normally developed. Good grooming.  Neck: Neck symmetrical, not swollen. Normal tracheal position.  Respiratory: No labored breathing, no use of accessory muscles.   Skin: No paleness, no jaundice, no cyanosis. No lesion, no ulcer, no rash.  Neurologic / Psychiatric: Oriented to time, oriented to place, oriented to person. No depression, no anxiety, no agitation.  Eyes: Normal conjunctivae. Normal eyelids.  Ears, Nose, Mouth, and Throat: Left ear no scars, no lesions, no masses. Right ear no scars, no lesions, no masses. Nose no scars, no lesions, no masses. Normal hearing. Normal lips.  Musculoskeletal: Normal gait and station of head and neck.     Complexity of Data:  Records Review:   Previous Patient Records, POC Tool  Urine Test Review:   Urinalysis   PROCEDURES:         Flexible Cystoscopy - 52000  Risks, benefits, and some of the potential complications of the procedure were discussed at length with the patient including infection, bleeding, voiding discomfort, urinary retention, fever, chills, sepsis, and others. All questions were answered. Informed consent was obtained. Sterile technique and intraurethral analgesia were used.  Meatus:  Normal size. Normal  location. Normal condition.  Urethra:  No strictures.  External Sphincter:  Normal.  Verumontanum:  Normal.  Prostate:  Obstructing lateral lobes  Bladder Neck:  Non-obstructing.  Ureteral Orifices:  Normal location. Normal size. Normal shape. Effluxed clear urine.  Bladder:  Poorly healing area with erythema on left posterior bladder wall approximately 2cm in diameter and superior to prior TUR scar concerning for recurrence      The lower urinary tract was carefully examined. The procedure was well-tolerated and without complications. Antibiotic instructions were given. Instructions were given to call the office immediately for bloody urine, difficulty urinating, urinary retention, painful or frequent urination, fever, chills, nausea, vomiting or other illness. The patient stated that he understood these instructions and would comply with them.         Urinalysis w/Scope - 81001 Dipstick Dipstick Cont'd Micro  Specimen: Voided Bilirubin: Neg WBC/hpf: 0 - 5/hpf  Color: Straw Ketones: Trace RBC/hpf: 10 - 20/hpf  Appearance: Cloudy Blood: 3+ Bacteria: NS (Not Seen)  Specific Gravity: 1.020 Protein: Neg Cystals: NS (Not Seen)  pH: 5.0 Urobilinogen: 0.2 Casts: NS (Not Seen)  Glucose: 2+ Nitrites: Neg Trichomonas: Not Present    Leukocyte Esterase: 1+ Mucous: Not Present      Epithelial Cells: NS (Not Seen)      Yeast: NS (Not Seen)      Sperm: Not Present  ASSESSMENT:      ICD-10 Details  1 GU:   Bladder Cancer overlapping sites - C67.8 Chronic, Worsening   PLAN:           Orders Labs Cytology w/reflex FISH          Document Letter(s):  Created for Patient: Clinical Summary         Notes:   Bladder cancer:  -Area of concern is still present and I discussed with patient repeat cystoscopy with TURBT  -We will send bladder washing today from cystoscopy  -Risks and benefits of the procedure discussed the patient in detail including but not limited to pain, bleeding, infection,  bladder perforation, damage to surrounding structures, need for additional treatment  -Schedule next available surgery date

## 2022-02-07 ENCOUNTER — Encounter (HOSPITAL_COMMUNITY)
Admission: RE | Admit: 2022-02-07 | Discharge: 2022-02-07 | Disposition: A | Discharge status: Home / Self Care | Payer: Medicare Other | Source: Ambulatory Visit | Attending: Urology | Admitting: Urology

## 2022-02-07 ENCOUNTER — Other Ambulatory Visit: Payer: Self-pay

## 2022-02-07 ENCOUNTER — Encounter (HOSPITAL_COMMUNITY): Payer: Self-pay

## 2022-02-07 VITALS — BP 149/78 | HR 91 | Temp 98.3°F | Resp 16 | Ht 67.0 in | Wt 163.2 lb

## 2022-02-07 DIAGNOSIS — E119 Type 2 diabetes mellitus without complications: Secondary | ICD-10-CM | POA: Diagnosis not present

## 2022-02-07 DIAGNOSIS — D649 Anemia, unspecified: Secondary | ICD-10-CM | POA: Insufficient documentation

## 2022-02-07 DIAGNOSIS — Z01812 Encounter for preprocedural laboratory examination: Secondary | ICD-10-CM | POA: Diagnosis present

## 2022-02-07 HISTORY — DX: Anemia, unspecified: D64.9

## 2022-02-07 HISTORY — DX: Depression, unspecified: F32.A

## 2022-02-07 LAB — BASIC METABOLIC PANEL
Anion gap: 8 (ref 5–15)
BUN: 24 mg/dL — ABNORMAL HIGH (ref 8–23)
CO2: 23 mmol/L (ref 22–32)
Calcium: 8.8 mg/dL — ABNORMAL LOW (ref 8.9–10.3)
Chloride: 106 mmol/L (ref 98–111)
Creatinine, Ser: 0.79 mg/dL (ref 0.61–1.24)
GFR, Estimated: >60 mL/min (ref >60)
Glucose, Bld: 201 mg/dL — ABNORMAL HIGH (ref 70–99)
Potassium: 4.4 mmol/L (ref 3.5–5.1)
Sodium: 137 mmol/L (ref 135–145)

## 2022-02-07 LAB — CBC
HCT: 41.1 % (ref 39.0–52.0)
Hemoglobin: 13.8 g/dL (ref 13.0–17.0)
MCH: 31.4 pg (ref 26.0–34.0)
MCHC: 33.6 g/dL (ref 30.0–36.0)
MCV: 93.6 fL (ref 80.0–100.0)
Platelets: 168 10*3/uL (ref 150–400)
RBC: 4.39 MIL/uL (ref 4.22–5.81)
RDW: 12.4 % (ref 11.5–15.5)
WBC: 10.2 10*3/uL (ref 4.0–10.5)
nRBC: 0 % (ref 0.0–0.2)

## 2022-02-07 LAB — GLUCOSE, CAPILLARY: Glucose-Capillary: 182 mg/dL — ABNORMAL HIGH (ref 70–99)

## 2022-02-12 ENCOUNTER — Encounter (HOSPITAL_COMMUNITY): Payer: Self-pay | Admitting: Urology

## 2022-02-12 ENCOUNTER — Ambulatory Visit (HOSPITAL_BASED_OUTPATIENT_CLINIC_OR_DEPARTMENT_OTHER): Payer: Medicare Other | Admitting: Anesthesiology

## 2022-02-12 ENCOUNTER — Encounter (HOSPITAL_COMMUNITY)
Admission: RE | Disposition: A | Discharge status: Home / Self Care | Payer: Self-pay | Source: Home / Self Care | Attending: Urology

## 2022-02-12 ENCOUNTER — Ambulatory Visit (HOSPITAL_COMMUNITY)
Admission: RE | Admit: 2022-02-12 | Discharge: 2022-02-12 | Disposition: A | Discharge status: Home / Self Care | Payer: Medicare Other | Attending: Urology | Admitting: Urology

## 2022-02-12 ENCOUNTER — Ambulatory Visit (HOSPITAL_COMMUNITY): Payer: Medicare Other | Admitting: Physician Assistant

## 2022-02-12 DIAGNOSIS — C679 Malignant neoplasm of bladder, unspecified: Secondary | ICD-10-CM | POA: Diagnosis not present

## 2022-02-12 DIAGNOSIS — I252 Old myocardial infarction: Secondary | ICD-10-CM | POA: Diagnosis not present

## 2022-02-12 DIAGNOSIS — N3020 Other chronic cystitis without hematuria: Secondary | ICD-10-CM | POA: Insufficient documentation

## 2022-02-12 DIAGNOSIS — N400 Enlarged prostate without lower urinary tract symptoms: Secondary | ICD-10-CM | POA: Diagnosis not present

## 2022-02-12 DIAGNOSIS — Z7902 Long term (current) use of antithrombotics/antiplatelets: Secondary | ICD-10-CM | POA: Insufficient documentation

## 2022-02-12 DIAGNOSIS — F17200 Nicotine dependence, unspecified, uncomplicated: Secondary | ICD-10-CM | POA: Insufficient documentation

## 2022-02-12 DIAGNOSIS — I509 Heart failure, unspecified: Secondary | ICD-10-CM

## 2022-02-12 DIAGNOSIS — C678 Malignant neoplasm of overlapping sites of bladder: Secondary | ICD-10-CM | POA: Diagnosis present

## 2022-02-12 DIAGNOSIS — Z951 Presence of aortocoronary bypass graft: Secondary | ICD-10-CM | POA: Diagnosis not present

## 2022-02-12 DIAGNOSIS — Z79899 Other long term (current) drug therapy: Secondary | ICD-10-CM | POA: Insufficient documentation

## 2022-02-12 DIAGNOSIS — F17210 Nicotine dependence, cigarettes, uncomplicated: Secondary | ICD-10-CM

## 2022-02-12 DIAGNOSIS — Z8551 Personal history of malignant neoplasm of bladder: Secondary | ICD-10-CM | POA: Diagnosis not present

## 2022-02-12 DIAGNOSIS — E1151 Type 2 diabetes mellitus with diabetic peripheral angiopathy without gangrene: Secondary | ICD-10-CM | POA: Insufficient documentation

## 2022-02-12 DIAGNOSIS — I110 Hypertensive heart disease with heart failure: Secondary | ICD-10-CM | POA: Insufficient documentation

## 2022-02-12 DIAGNOSIS — I739 Peripheral vascular disease, unspecified: Secondary | ICD-10-CM | POA: Insufficient documentation

## 2022-02-12 DIAGNOSIS — Z955 Presence of coronary angioplasty implant and graft: Secondary | ICD-10-CM | POA: Diagnosis not present

## 2022-02-12 DIAGNOSIS — I2510 Atherosclerotic heart disease of native coronary artery without angina pectoris: Secondary | ICD-10-CM | POA: Insufficient documentation

## 2022-02-12 DIAGNOSIS — E119 Type 2 diabetes mellitus without complications: Secondary | ICD-10-CM

## 2022-02-12 DIAGNOSIS — Z7984 Long term (current) use of oral hypoglycemic drugs: Secondary | ICD-10-CM

## 2022-02-12 HISTORY — PX: TRANSURETHRAL RESECTION OF BLADDER TUMOR: SHX2575

## 2022-02-12 LAB — GLUCOSE, CAPILLARY
Glucose-Capillary: 272 mg/dL — ABNORMAL HIGH (ref 70–99)
Glucose-Capillary: 193 mg/dL — ABNORMAL HIGH (ref 70–99)

## 2022-02-12 SURGERY — TURBT (TRANSURETHRAL RESECTION OF BLADDER TUMOR)
Anesthesia: General | Site: Penis

## 2022-02-12 MED ORDER — ROCURONIUM BROMIDE 10 MG/ML (PF) SYRINGE
PREFILLED_SYRINGE | INTRAVENOUS | Status: AC
  Filled 2022-02-12: qty 10

## 2022-02-12 MED ORDER — FENTANYL CITRATE (PF) 100 MCG/2ML IJ SOLN
INTRAMUSCULAR | Status: DC | PRN
  Administered 2022-02-12 (×4): 25 ug via INTRAVENOUS

## 2022-02-12 MED ORDER — SODIUM CHLORIDE 0.9 % IR SOLN
Status: DC | PRN
  Administered 2022-02-12: 9000 mL via INTRAVESICAL

## 2022-02-12 MED ORDER — METHYLENE BLUE 1 % INJ SOLN
INTRAVENOUS | Status: DC | PRN
  Administered 2022-02-12: 50 mg via INTRAVENOUS

## 2022-02-12 MED ORDER — PHENYLEPHRINE 80 MCG/ML (10ML) SYRINGE FOR IV PUSH (FOR BLOOD PRESSURE SUPPORT)
PREFILLED_SYRINGE | INTRAVENOUS | Status: DC | PRN
  Administered 2022-02-12: 160 ug via INTRAVENOUS
  Administered 2022-02-12: 80 ug via INTRAVENOUS

## 2022-02-12 MED ORDER — FENTANYL CITRATE PF 50 MCG/ML IJ SOSY
PREFILLED_SYRINGE | INTRAMUSCULAR | Status: AC
  Filled 2022-02-12: qty 1

## 2022-02-12 MED ORDER — PROPOFOL 10 MG/ML IV BOLUS
INTRAVENOUS | Status: AC
  Filled 2022-02-12: qty 20

## 2022-02-12 MED ORDER — 0.9 % SODIUM CHLORIDE (POUR BTL) OPTIME
TOPICAL | Status: DC | PRN
  Administered 2022-02-12: 1000 mL

## 2022-02-12 MED ORDER — PHENYLEPHRINE HCL-NACL 20-0.9 MG/250ML-% IV SOLN
INTRAVENOUS | Status: DC | PRN
  Administered 2022-02-12: 20 ug/min via INTRAVENOUS

## 2022-02-12 MED ORDER — TRAMADOL HCL 50 MG PO TABS: 50.0000 mg | ORAL_TABLET | Freq: Four times a day (QID) | ORAL | 0 refills | Status: DC | PRN

## 2022-02-12 MED ORDER — SUGAMMADEX SODIUM 200 MG/2ML IV SOLN
INTRAVENOUS | Status: DC | PRN
  Administered 2022-02-12: 200 mg via INTRAVENOUS

## 2022-02-12 MED ORDER — STERILE WATER FOR IRRIGATION IR SOLN
Status: DC | PRN
  Administered 2022-02-12: 1000 mL

## 2022-02-12 MED ORDER — CEPHALEXIN 250 MG PO CAPS: 250.0000 mg | ORAL_CAPSULE | Freq: Every day | ORAL | 0 refills | Status: AC

## 2022-02-12 MED ORDER — DEXAMETHASONE SODIUM PHOSPHATE 10 MG/ML IJ SOLN
INTRAMUSCULAR | Status: AC
  Filled 2022-02-12: qty 1

## 2022-02-12 MED ORDER — ONDANSETRON HCL 4 MG/2ML IJ SOLN: 4.0000 mg | Freq: Once | INTRAMUSCULAR | Status: DC | PRN

## 2022-02-12 MED ORDER — METHYLENE BLUE 1 % INJ SOLN
INTRAVENOUS | Status: AC
  Filled 2022-02-12: qty 10

## 2022-02-12 MED ORDER — ONDANSETRON HCL 4 MG/2ML IJ SOLN
INTRAMUSCULAR | Status: DC | PRN
  Administered 2022-02-12: 4 mg via INTRAVENOUS

## 2022-02-12 MED ORDER — FENTANYL CITRATE PF 50 MCG/ML IJ SOSY
25.0000 ug | PREFILLED_SYRINGE | INTRAMUSCULAR | Status: DC | PRN
  Administered 2022-02-12: 50 ug via INTRAVENOUS

## 2022-02-12 MED ORDER — ONDANSETRON HCL 4 MG/2ML IJ SOLN
INTRAMUSCULAR | Status: AC
  Filled 2022-02-12: qty 2

## 2022-02-12 MED ORDER — LIDOCAINE HCL (PF) 2 % IJ SOLN
INTRAMUSCULAR | Status: AC
  Filled 2022-02-12: qty 5

## 2022-02-12 MED ORDER — MIDAZOLAM HCL 5 MG/5ML IJ SOLN
INTRAMUSCULAR | Status: DC | PRN
  Administered 2022-02-12: 2 mg via INTRAVENOUS

## 2022-02-12 MED ORDER — MIDAZOLAM HCL 2 MG/2ML IJ SOLN
INTRAMUSCULAR | Status: AC
  Filled 2022-02-12: qty 2

## 2022-02-12 MED ORDER — CHLORHEXIDINE GLUCONATE 0.12 % MT SOLN
15.0000 mL | Freq: Once | OROMUCOSAL | Status: AC
  Administered 2022-02-12: 15 mL via OROMUCOSAL

## 2022-02-12 MED ORDER — DEXAMETHASONE SODIUM PHOSPHATE 10 MG/ML IJ SOLN
INTRAMUSCULAR | Status: DC | PRN
  Administered 2022-02-12: 8 mg via INTRAVENOUS

## 2022-02-12 MED ORDER — ROCURONIUM BROMIDE 10 MG/ML (PF) SYRINGE
PREFILLED_SYRINGE | INTRAVENOUS | Status: DC | PRN
  Administered 2022-02-12: 50 mg via INTRAVENOUS

## 2022-02-12 MED ORDER — CEFAZOLIN SODIUM-DEXTROSE 2-4 GM/100ML-% IV SOLN
2.0000 g | INTRAVENOUS | Status: AC
  Administered 2022-02-12: 2 g via INTRAVENOUS
  Filled 2022-02-12: qty 100

## 2022-02-12 MED ORDER — PROPOFOL 10 MG/ML IV BOLUS
INTRAVENOUS | Status: DC | PRN
  Administered 2022-02-12: 100 mg via INTRAVENOUS

## 2022-02-12 MED ORDER — ORAL CARE MOUTH RINSE: 15.0000 mL | Freq: Once | OROMUCOSAL | Status: AC

## 2022-02-12 MED ORDER — ACETAMINOPHEN 500 MG PO TABS
1000.0000 mg | ORAL_TABLET | Freq: Once | ORAL | Status: AC
  Administered 2022-02-12: 1000 mg via ORAL
  Filled 2022-02-12: qty 2

## 2022-02-12 MED ORDER — FENTANYL CITRATE (PF) 100 MCG/2ML IJ SOLN
INTRAMUSCULAR | Status: AC
  Filled 2022-02-12: qty 2

## 2022-02-12 MED ORDER — LIDOCAINE 2% (20 MG/ML) 5 ML SYRINGE
INTRAMUSCULAR | Status: DC | PRN
  Administered 2022-02-12: 80 mg via INTRAVENOUS

## 2022-02-12 MED ORDER — LACTATED RINGERS IV SOLN: INTRAVENOUS | Status: DC

## 2022-02-12 SURGICAL SUPPLY — 19 items
BAG DRN RND TRDRP ANRFLXCHMBR (UROLOGICAL SUPPLIES)
BAG URINE DRAIN 2000ML AR STRL (UROLOGICAL SUPPLIES) IMPLANT
BAG URO CATCHER STRL LF (MISCELLANEOUS) ×2 IMPLANT
CATH FOLEY 2WAY SLVR  5CC 18FR (CATHETERS) ×1
CATH FOLEY 2WAY SLVR 5CC 18FR (CATHETERS) IMPLANT
CLOTH BEACON ORANGE TIMEOUT ST (SAFETY) ×2 IMPLANT
DRAPE FOOT SWITCH (DRAPES) ×2 IMPLANT
ELECT REM PT RETURN 15FT ADLT (MISCELLANEOUS) IMPLANT
GLOVE BIO SURGEON STRL SZ 6.5 (GLOVE) ×2 IMPLANT
GOWN STRL REUS W/ TWL LRG LVL3 (GOWN DISPOSABLE) ×2 IMPLANT
GOWN STRL REUS W/TWL LRG LVL3 (GOWN DISPOSABLE) ×1
KIT TURNOVER KIT A (KITS) IMPLANT
LOOP CUT BIPOLAR 24F LRG (ELECTROSURGICAL) IMPLANT
MANIFOLD NEPTUNE II (INSTRUMENTS) ×2 IMPLANT
PACK CYSTO (CUSTOM PROCEDURE TRAY) ×2 IMPLANT
SYR TOOMEY IRRIG 70ML (MISCELLANEOUS) ×1
SYRINGE TOOMEY IRRIG 70ML (MISCELLANEOUS) IMPLANT
TUBING CONNECTING 10 (TUBING) ×2 IMPLANT
TUBING UROLOGY SET (TUBING) ×2 IMPLANT

## 2022-02-12 NOTE — Interval H&P Note (Signed)
History and Physical Interval Note:  02/12/2022 10:00 AM  Eric Bradshaw  has presented today for surgery, with the diagnosis of BLADDER CANCER.  The various methods of treatment have been discussed with the patient and family. After consideration of risks, benefits and other options for treatment, the patient has consented to  Procedure(s) with comments: TRANSURETHRAL RESECTION OF BLADDER TUMOR (TURBT) (N/A) - 1 HR as a surgical intervention.  The patient's history has been reviewed, patient examined, no change in status, stable for surgery.  I have reviewed the patient's chart and labs.  Questions were answered to the patient's satisfaction.     [name] [name]

## 2022-02-12 NOTE — Op Note (Signed)
PATIENT:  Eric Bradshaw  PRE-OPERATIVE DIAGNOSIS: Bladder tumor  POST-OPERATIVE DIAGNOSIS: Same  PROCEDURE:  Procedure(s): 1. TRANSURETHRAL RESECTION OF BLADDER TUMOR (TURBT) (2cm.) 2.  Urethral dilation  SURGEON:  Jacalyn Lefevre, MD  ANESTHESIA:   General  EBL:  Minimal  DRAINS: Urethral catheter (18 Fr. Foley)   SPECIMEN:  Bladder tumor  DISPOSITION OF SPECIMEN:  PATHOLOGY  FINDINGS: Normal anterior urethra Lateral lobe prostatic hyperplasia, no intravesical median lobe Trigone somewhat distorted to left of bladder Prior TUR scar with erythema and irregular mucosa - posterior slightly left bladder wall also involving right UO Clear efflux seen bilaterally at start and end of case. Right UO resected.   Indication:    Description of operation: The patient was taken to the operating room and administered general anesthesia. They were then placed on the table and moved to the dorsal lithotomy position after which the genitalia was sterilely prepped and draped. An official timeout was then performed.  The urethral meatus was dilated from 22 [place] to 93 [place] with urethral sounds.  The 4 French resectoscope with the 30 lens and visual obturator were then passed into the bladder under direct visualization. Urethra appeared normal. The visual obturator was then removed and the Gyrus resectoscope element with 30  lens was then inserted and the bladder was fully and systematically inspected.   I first began by resecting the irregular mucosa on the posterior bladder wall at prior TUR site.  After resection was complete hemostasis was achieved with bipolar electrocautery.  Reinspection of the bladder revealed all obvious tumor had been fully resected and there was no evidence of perforation. The [place] as then used to irrigate the bladder and remove all of the portions of bladder tumor which were sent to pathology.  Resection included the right ureteral orifice.  Clear efflux  was seen from from both UOs at the conclusion of resection and hemostasis.   I then removed the resectoscope.  A 18 French Foley catheter was then inserted in the bladder and irrigated. The patient was awakened and taken to the recovery room.   PLAN OF CARE: Discharge to home after PACU with foley in place.  PATIENT DISPOSITION:  PACU - hemodynamically stable.

## 2022-02-12 NOTE — Anesthesia Procedure Notes (Signed)
Procedure Name: Intubation Date/Time: 02/12/2022 12:12 PM  Performed by: Victoriano Lain, CRNAPre-anesthesia Checklist: Patient identified, Emergency Drugs available, Patient being monitored, Suction available and Timeout performed Patient Re-evaluated:Patient Re-evaluated prior to induction Oxygen Delivery Method: Circle system utilized Preoxygenation: Pre-oxygenation with 100% oxygen Induction Type: IV induction Ventilation: Mask ventilation without difficulty Laryngoscope Size: Mac and 4 Grade View: Grade I Tube type: Oral Tube size: 7.5 mm Number of attempts: 1 Airway Equipment and Method: Stylet Placement Confirmation: ETT inserted through vocal cords under direct vision, positive ETCO2 and breath sounds checked- equal and bilateral Secured at: 22 cm Tube secured with: Tape Dental Injury: Teeth and Oropharynx as per pre-operative assessment

## 2022-02-12 NOTE — Anesthesia Preprocedure Evaluation (Addendum)
Anesthesia Evaluation  Patient identified by MRN, date of birth, ID band Patient awake    Reviewed: Allergy & Precautions, NPO status , Patient's Chart, lab work & pertinent test results, reviewed documented beta blocker date and time   Airway Mallampati: III  TM Distance: >3 FB Neck ROM: Full    Dental  (+) Dental Advisory Given, Edentulous Upper, Edentulous Lower   Pulmonary Current Smoker and Patient abstained from smoking.,    Pulmonary exam normal breath sounds clear to auscultation       Cardiovascular hypertension, Pt. on home beta blockers and Pt. on medications + CAD, + Past MI, + Cardiac Stents, + CABG, + Peripheral Vascular Disease and +CHF  Normal cardiovascular exam+ Valvular Problems/Murmurs (s/p MV ring)  Rhythm:Regular Rate:Normal  LVEF 35-40% on 2015 Echo   Neuro/Psych PSYCHIATRIC DISORDERS Depression negative neurological ROS     GI/Hepatic negative GI ROS, Neg liver ROS,   Endo/Other  diabetes, Type 2, Oral Hypoglycemic Agents  Renal/GU negative Renal ROS   Bladder cancer     Musculoskeletal negative musculoskeletal ROS (+)   Abdominal   Peds  Hematology  (+) Blood dyscrasia (Plavix), ,   Anesthesia Other Findings Day of surgery medications reviewed with the patient.  Reproductive/Obstetrics                            Anesthesia Physical Anesthesia Plan  ASA: 4  Anesthesia Plan: General   Post-op Pain Management: Tylenol PO (pre-op)*   Induction: Intravenous  PONV Risk Score and Plan: 2 and Dexamethasone and Ondansetron  Airway Management Planned: Oral ETT  Additional Equipment:   Intra-op Plan:   Post-operative Plan: Extubation in OR  Informed Consent: I have reviewed the patients History and Physical, chart, labs and discussed the procedure including the risks, benefits and alternatives for the proposed anesthesia with the patient or authorized  representative who has indicated his/her understanding and acceptance.     Dental advisory given  Plan Discussed with: CRNA  Anesthesia Plan Comments:         Anesthesia Quick Evaluation

## 2022-02-12 NOTE — Discharge Instructions (Signed)

## 2022-02-12 NOTE — Transfer of Care (Signed)
Immediate Anesthesia Transfer of Care Note  Patient: Eric Bradshaw  Procedure(s) Performed: TRANSURETHRAL RESECTION OF BLADDER TUMOR (TURBT) (Penis)  Patient Location: PACU  Anesthesia Type:General  Level of Consciousness: awake, alert , oriented and patient cooperative  Airway & Oxygen Therapy: Patient Spontanous Breathing and Patient connected to face mask oxygen  Post-op Assessment: Report given to RN, Post -op Vital signs reviewed and stable and Patient moving all extremities  Post vital signs: Reviewed and stable  Last Vitals:  Vitals Value Taken Time  BP 154/79 02/12/22 1305  Temp 36.6 C 02/12/22 1305  Pulse 91 02/12/22 1307  Resp 26 02/12/22 1307  SpO2 91 % 02/12/22 1307  Vitals shown include unvalidated device data.  Last Pain:  Vitals:   02/12/22 0959  TempSrc:   PainSc: 0-No pain         Complications: No notable events documented.

## 2022-02-13 ENCOUNTER — Encounter (HOSPITAL_COMMUNITY): Payer: Self-pay | Admitting: Urology

## 2022-02-13 LAB — SURGICAL PATHOLOGY

## 2022-02-13 NOTE — Anesthesia Postprocedure Evaluation (Signed)
Anesthesia Post Note  Patient: Eric Bradshaw  Procedure(s) Performed: TRANSURETHRAL RESECTION OF BLADDER TUMOR (TURBT) (Penis)     Patient location during evaluation: PACU Anesthesia Type: General Level of consciousness: awake and alert Pain management: pain level controlled Vital Signs Assessment: post-procedure vital signs reviewed and stable Respiratory status: spontaneous breathing, nonlabored ventilation, respiratory function stable and patient connected to nasal cannula oxygen Cardiovascular status: blood pressure returned to baseline and stable Postop Assessment: no apparent nausea or vomiting Anesthetic complications: no   No notable events documented.  Last Vitals:  Vitals:   02/12/22 1400 02/12/22 1430  BP: (!) 147/72   Pulse: 86 85  Resp: 15   Temp: 36.7 C   SpO2: 92% 96%    Last Pain:  Vitals:   02/12/22 1430  TempSrc:   PainSc: [place]

## 2022-03-01 ENCOUNTER — Encounter: Payer: Self-pay | Admitting: Cardiology

## 2022-03-01 ENCOUNTER — Ambulatory Visit: Payer: Medicare Other | Attending: Cardiology | Admitting: Cardiology

## 2022-03-01 VITALS — BP 136/68 | HR 84 | Ht 67.0 in | Wt 158.4 lb

## 2022-03-01 DIAGNOSIS — F17200 Nicotine dependence, unspecified, uncomplicated: Secondary | ICD-10-CM | POA: Insufficient documentation

## 2022-03-01 DIAGNOSIS — E1169 Type 2 diabetes mellitus with other specified complication: Secondary | ICD-10-CM | POA: Insufficient documentation

## 2022-03-01 DIAGNOSIS — I255 Ischemic cardiomyopathy: Secondary | ICD-10-CM | POA: Diagnosis not present

## 2022-03-01 DIAGNOSIS — I10 Essential (primary) hypertension: Secondary | ICD-10-CM | POA: Diagnosis not present

## 2022-03-01 DIAGNOSIS — R0989 Other specified symptoms and signs involving the circulatory and respiratory systems: Secondary | ICD-10-CM | POA: Diagnosis present

## 2022-03-01 DIAGNOSIS — Z951 Presence of aortocoronary bypass graft: Secondary | ICD-10-CM | POA: Insufficient documentation

## 2022-03-01 DIAGNOSIS — I214 Non-ST elevation (NSTEMI) myocardial infarction: Secondary | ICD-10-CM | POA: Insufficient documentation

## 2022-03-01 DIAGNOSIS — Z9861 Coronary angioplasty status: Secondary | ICD-10-CM | POA: Diagnosis not present

## 2022-03-01 DIAGNOSIS — E785 Hyperlipidemia, unspecified: Secondary | ICD-10-CM | POA: Insufficient documentation

## 2022-03-01 DIAGNOSIS — Z9889 Other specified postprocedural states: Secondary | ICD-10-CM | POA: Insufficient documentation

## 2022-03-01 DIAGNOSIS — I2510 Atherosclerotic heart disease of native coronary artery without angina pectoris: Secondary | ICD-10-CM | POA: Insufficient documentation

## 2022-03-01 DIAGNOSIS — Z955 Presence of coronary angioplasty implant and graft: Secondary | ICD-10-CM | POA: Diagnosis present

## 2022-03-01 NOTE — Progress Notes (Unsigned)
Primary Care Provider: [facility] Cardiologist: Glenetta Hew, MD Electrophysiologist: None Urology: Dr. Claudia Desanctis  Clinic Note: No chief complaint on file.   ===================================  ASSESSMENT/PLAN   Problem List Items Addressed This Visit       Cardiology Problems   CAD - s/p CABG x 17 Mar 2010. Abnormal Myoview-LAD & D1 disease 06/2011 Rx'd with Promus DES - Primary (Chronic)   Ischemic Cardiomyopathy - EF ~35 -40%; Class I-II CHF (Chronic)   Essential hypertension (Chronic)   Hyperlipidemia associated with type 2 diabetes mellitus (HCC) (Chronic)   NSTEMI (non-ST elevated myocardial infarction) (HCC) (Chronic)     Other   Right carotid bruit   Tobacco dependence (Chronic)    Started back smoking b/c of stress with Cancer - no > 1/2 ppd.       Presence of drug coated stent in LAD coronary artery (Chronic)   S/P CABG x 3 (Chronic)   S/P MVR (mitral valve repair) (Chronic)    ===================================  HPI:    Eric Bradshaw is a 66 y.o. male with a PMH below who presents today for ***.  EF 35-40%, coronary artery disease status post CABG 2011, abnormal Myoview-LAD and D1 disease 3/13 with PCI and DES, essential hypertension, NSTEMI, hyperlipidemia, diabetes mellitus, tobacco dependence, overweight, status post mitral valve repair, and right carotid bruit.   Eric Bradshaw was last seen on ***  Recent Hospitalizations:  10/31/2023TUR[facility]  Reviewed  CV studies:    The following studies were reviewed today: (if available, images/films reviewed: From Epic Chart or Care Everywhere) ***:  Interval History:   Eric Bradshaw   CV Review of Symptoms (Summary): Cardiovascular ROS: {roscv:310661}  REVIEWED OF SYSTEMS   ROS  I have reviewed and (if needed) personally updated the patient's problem list, medications, allergies, past medical and surgical history, social and family history.   PAST  MEDICAL HISTORY   Past Medical History:  Diagnosis Date   Anemia    CAD S/P percutaneous coronary angioplasty 03/2010 -- 3-07/2011   Proximal LAD ~80% (into D2) & mid - subtotal occlusion , RCA occlusion, LCx occlusion -- s/p CABG x 3  (LIMA-LAD, SVG-OM, SVG-RPDA); Cath 06/2011, patent grafts with severe proximal  LAD - D1 lesions -- 07/2011: PCI  proxLAD-into D2 with Promus Premier DES 2.5 mm x 38 mm (distal in D2 2.5 mm, @ bifurcation - 2.75 m, in prox LAD ~3.0 mm   Cancer (HCC)    bladder   Carotid artery disease ([place])    moderate by 2017 duplex US   CHF (congestive heart failure) ([place])    Chronic combined systolic and diastolic HF (heart failure), NYHA class 2 ([place]) 03/2010; 08/2103   a) EF ~30-30%; Exertional Dyspnea; No exaccerbations.;; b)EF 35-40%, Mod LA dilation   Depression    DM (diabetes mellitus) type II controlled peripheral vascular disorder 04/16/2011   & CAD   Heart murmur    History of kidney stones    HTN, goal below 130/80 03/15/2010   Hyperlipidemia LDL goal < 70    Ischemic cardiomyopathy 03/2010; May 2015   a) Echo - EF 30-40%; mild/mod anterior wall hypokinesis; doppler flow suggestive of impaired LV relaxation; LA moderately dilated; annuloplasty ring noted in mitral position; mild/mod mitral regurgitation; RVsystolic pressure elevated at 30-94mHg;;; b) Echo May 2015: EF 35-40%   NSTEMI (non-ST elevated myocardial infarction) ([place] 116/01/9603  Complicated by Severe Acute Systolic CHF -- Class IV CHF,   S/P CABG  x 3 03/15/2010   LIMA-mLAD, SVG-OM, SVG-rPDA   S/P MVR (mitral valve repair) 03/15/2010   @ time of CABG for ischemic MR - 26 mm Edwards physio-2 annuloplasty ring   Tobacco dependence    Initially quit - restarted ~mid 2012; now essentially quit.    PAST SURGICAL HISTORY   Past Surgical History:  Procedure Laterality Date   CATARACT EXTRACTION W/ INTRAOCULAR LENS IMPLANT Right    CORONARY ARTERY BYPASS GRAFT  03/15/2010   LIMA-LAD, SVG-OM,  SVG-RPDA   CYSTOSCOPY N/A 08/14/2021   Procedure: CYSTOSCOPY;  Surgeon: Robley Fries, MD;  Location: [facility];  Service: Urology;  Laterality: N/A;   FULGURATION OF BLADDER TUMOR N/A 02/22/2021   Procedure: CYSTOSCOPY/CLOT EVACTION/FULGURATION OF BLADDER TUMOR;  Surgeon: Robley Fries, MD;  Location: [facility];  Service: Urology;  Laterality: N/A;   Left Arm Surgery - NOS     LEFT HEART CATHETERIZATION WITH CORONARY/GRAFT ANGIOGRAM  07/03/2011   Procedure: LEFT HEART CATHETERIZATION WITH Beatrix Fetters;  Surgeon: Leonie Man, MD;  Location: [facility] CATH LAB;  Service: Cardiovascular;Patent LIMA-LAD, SVG-OM, SVG-RPDA; (native RCA & OM prox 100%, prox LAD into major D2 ~80% long lesion, midLAD 100%   MITRAL VALVULOPLASTY  03/15/2010   conjuntive with CABG for Severe MR; 26 mm Edwards physio-2 annuloplasty ring   NM MYOVIEW LTD  04/24/2011   R/L MV - EF 37%; mild perfusion defect due to attenuation w/mild to mod superimposed ischemia seen in apex, apical lateral and distal to mid ateroseptal region; LV systolic fcn moderately reduced; hypotensive BP response to stress w/o chest pain; high risk scan   PERCUTANEOUS CORONARY STENT INTERVENTION (PCI-S) N/A 08/02/2011   Procedure: PERCUTANEOUS CORONARY STENT INTERVENTION (PCI-S);  Surgeon: Leonie Man, MD;  Location: [facility] CATH LAB;  Service: Cardiovascular;  PCI proximal LAD-D2: Promus Premier DES 2.5 x 38 (tapered post-dilation from 3.0 mm in proximal LAD, 2.75 mm at bifurcation and 2.5 mm in D2)   TONSILLECTOMY     Childhood   TRANSTHORACIC ECHOCARDIOGRAM  03/23/2010   Pre-CABG/MVR: EF 25-30%; global HK; Mod-Severe MR   TRANSTHORACIC ECHOCARDIOGRAM  09/2011; 08/2013   a) EF up to 30-40% (post PCI); mild-mod Ant wall HK; Gd 1 DD; MV Annuloplasty ring in place, mild-mod MR; mild PHTN (30-40 mmHg);; b)  EF 35-40% with moderately reduced function. Moderately dilated LV with distal septal-apical, mid and basal inferior akinesis; status post  mitral valve ring. Significant diastolic gradient with mild MR. Moderate LA dilation.     TRANSURETHRAL RESECTION OF BLADDER TUMOR N/A 01/30/2021   Procedure: RESTAGING TRANSURETHRAL RESECTION OF BLADDER TUMOR (TURBT);  Surgeon: Robley Fries, MD;  Location: [facility];  Service: Urology;  Laterality: N/A;  1 HR   TRANSURETHRAL RESECTION OF BLADDER TUMOR N/A 02/20/2021   Procedure: TRANSURETHRAL RESECTION OF BLADDER TUMOR (TURBT), RESTAGING;  Surgeon: Robley Fries, MD;  Location: [facility];  Service: Urology;  Laterality: N/A;  GENERAL ANESTHESIA WITH PARALYSIS   TRANSURETHRAL RESECTION OF BLADDER TUMOR N/A 08/14/2021   Procedure: TRANSURETHRAL RESECTION OF BLADDER TUMOR;  Surgeon: Robley Fries, MD;  Location: [facility];  Service: Urology;  Laterality: N/A;  75 MINS   TRANSURETHRAL RESECTION OF BLADDER TUMOR N/A 02/12/2022   Procedure: TRANSURETHRAL RESECTION OF BLADDER TUMOR (TURBT);  Surgeon: Robley Fries, MD;  Location: [facility];  Service: Urology;  Laterality: N/A;  1 HR   TRANSURETHRAL RESECTION OF BLADDER TUMOR WITH MITOMYCIN-C N/A 12/26/2020   Procedure: TRANSURETHRAL RESECTION OF BLADDER TUMOR WITH GEMCITABINE;  Surgeon: Robley Fries, MD;  Location: [facility];  Service: Urology;  Laterality: N/A;    Immunization History  Administered Date(s) Administered   PFIZER(Purple Top)SARS-COV-2 Vaccination 05/04/2019, 05/25/2019    MEDICATIONS/ALLERGIES   Current Meds  Medication Sig   Ascorbic Acid (VITAMIN C) 1000 MG tablet Take 1,000 mg by mouth in the morning.   aspirin EC 81 MG tablet Take 81 mg by mouth daily. Swallow whole.   B Complex-C (SUPER B COMPLEX PO) Take 1 capsule by mouth daily.   carvedilol (COREG) 12.5 MG tablet TAKE 1/2 TABLET BY MOUTH EVERY MORNING AND 1 TABLET AT NIGHT   Cholecalciferol (VITAMIN D-3) 125 MCG (5000 UT) TABS Take 5,000 Units by mouth in the morning.   clopidogrel (PLAVIX) 75 MG tablet Take 75 mg by mouth daily.   Echinacea 400 MG CAPS Take 400  mg by mouth in the morning.   Garlic (GARLIQUE PO) Take 1 tablet by mouth in the morning.   glimepiride (AMARYL) 4 MG tablet Take 4 mg by mouth 2 (two) times daily.   ibuprofen (ADVIL) 200 MG tablet Take 200-400 mg by mouth every 6 (six) hours as needed for moderate pain.   JARDIANCE 25 MG TABS tablet Take 25 mg by mouth in the morning.   Magnesium 500 MG TABS Take 500 mg by mouth in the morning.   Multiple Vitamins-Minerals (MULTIVITAMINS THER. W/MINERALS) TABS Take 1 tablet by mouth daily.   Omega-3 Fatty Acids (FISH OIL) 1000 MG CAPS Take 1,000 mg by mouth in the morning.   rosuvastatin (CRESTOR) 40 MG tablet Take 1 tablet (40 mg total) by mouth daily. (Patient taking differently: Take 40 mg by mouth every evening.)   Turmeric 500 MG CAPS Take 500 mg by mouth daily.   valsartan (DIOVAN) 80 MG tablet TAKE 1 TABLET(80 MG) BY MOUTH DAILY   vitamin E 1000 UNIT capsule Take 1,000 Units by mouth daily.   Zinc 50 MG TABS Take 50 mg by mouth in the morning.    Allergies  Allergen Reactions   Morphine And Related Other (See Comments)    HEADACHE    SOCIAL HISTORY/FAMILY HISTORY   Reviewed in Epic:  Pertinent findings:  Social History   Tobacco Use   Smoking status: Every Day    Packs/day: 0.50    Years: 50.00    Total pack years: 25.00    Types: Cigarettes   Smokeless tobacco: Former    Types: Chew   Tobacco comments:    Quit in 2011 but restarted mid 2012,uses E-cigarettes; Not actually smoking, but does have 2nd hand exposure.    11/17 patient states he has started back smoking once diagnosed with cancer  Vaping Use   Vaping Use: Former   Start date: 02/28/2016  Substance Use Topics   Alcohol use: Yes    Alcohol/week: 14.0 standard drinks of alcohol    Types: 7 Cans of beer, 7 Shots of liquor per week   Drug use: No   Social History   Social History Narrative   He currently lives with girlfriend. He has 2 children from his ex-wife.   Still smokes less than a quarter  pack a day. He goes back and forth between having quit in smoking again.   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 He is hoping that he can increase his hours at least one of those jobs and up to to get him back  on an insurance plan.    OBJCTIVE -PE, EKG, labs   Wt Readings from Last 3 Encounters:  03/01/22 158 lb 6.4 oz (71.8 kg)  02/12/22 157 lb (71.2 kg)  02/07/22 163 lb 3.2 oz (74 kg)    Physical Exam: BP 136/68 (BP Location: Left Arm, Patient Position: Sitting, Cuff Size: Normal)   Pulse 84   Ht '5\' 7"'$  (1.702 m)   Wt 158 lb 6.4 oz (71.8 kg)   SpO2 98%   BMI 24.81 kg/m  Physical Exam   Adult ECG Report  Rate: *** ;  Rhythm: {rhythm:17366};   Narrative Interpretation: ***  Recent Labs:  ***  Lab Results  Component Value Date   CHOL 143 04/05/2019   HDL 42 04/05/2019   LDLCALC 70 04/05/2019   TRIG 183 (H) 04/05/2019   CHOLHDL 3.4 04/05/2019   Lab Results  Component Value Date   CREATININE 0.79 02/07/2022   BUN 24 (H) 02/07/2022   NA 137 02/07/2022   K 4.4 02/07/2022   CL 106 02/07/2022   CO2 23 02/07/2022      Latest Ref Rng & Units 02/07/2022    9:08 AM 08/10/2021    2:47 PM 02/23/2021    7:34 AM  CBC  WBC 4.0 - 10.5 K/uL 10.2  9.9  11.6   Hemoglobin 13.0 - 17.0 g/dL 13.8  14.0  8.2   Hematocrit 39.0 - 52.0 % 41.1  41.8  25.5   Platelets 150 - 400 K/uL 168  165  172     Lab Results  Component Value Date   HGBA1C 7.8 (H) 08/10/2021   Lab Results  Component Value Date   TSH 0.959 04/05/2019    ================================================== I spent a total of ***minutes with the patient spent in direct patient consultation.  Additional time spent with chart review  / charting (studies, outside notes, etc): *** min Total Time: *** min  Current medicines are reviewed at length with the patient today.  (+/- concerns) ***  Notice: This dictation was prepared with Dragon dictation  along with smart phrase technology. Any transcriptional errors that result from this process are unintentional and may not be corrected upon review.  Studies Ordered:   No orders of the defined types were placed in this encounter.  No orders of the defined types were placed in this encounter.   Patient Instructions / Medication Changes & Studies & Tests Ordered   There are no Patient Instructions on file for this visit.     Leonie Man, MD, MS Glenetta Hew, M.D., M.S. Interventional Cardiologist  [place]  Pager # [phone] Phone # [phone] [address] [place], [place] [address]   Thank you for choosing [facility]!!

## 2022-03-01 NOTE — Assessment & Plan Note (Signed)
Started back smoking b/c of stress with Cancer - no > 1/2 ppd.   He knows he needs to quit, but says that the stress of the cancer is just has him freaked out.  He needs it to calm himself down.  We will continue to address.

## 2022-03-01 NOTE — Patient Instructions (Signed)
Medication Instructions:   No changes  *If you need a refill on your cardiac medications before your next appointment, please call your pharmacy*   Lab Work: LIPID CMP  If you have labs (blood work) drawn today and your tests are completely normal, you will receive your results only by: [place] (if you have MyChart) OR A paper copy in the mail If you have any lab test that is abnormal or we need to change your treatment, we will call you to review the results.   Testing/Procedures:  Will be schedule at [place] has requested that you have an echocardiogram. Echocardiography is a painless test that uses sound waves to create images of your heart. It provides your doctor with information about the size and shape of your heart and how well your heart's chambers and valves are working. This procedure takes approximately one hour. There are no restrictions for this procedure. Please do NOT wear cologne, perfume, aftershave, or lotions (deodorant is allowed). Please arrive 15 minutes prior to your appointment time.  And  [place] has requested that you have a carotid duplex. This test is an ultrasound of the carotid arteries in your neck. It looks at blood flow through these arteries that supply the brain with blood. Allow one hour for this exam. There are no restrictions or special instructions.   Follow-Up: At [facility], you and your health needs are our priority.  As part of our continuing mission to provide you with exceptional heart care, we have created designated Provider Care Teams.  These Care Teams include your primary Cardiologist (physician) and Advanced Practice Providers (APPs -  Physician Assistants and Nurse Practitioners) who all work together to provide you with the care you need, when you need it.     Your next appointment:   12 month(s)  The format for your next appointment:    In Person  Provider:   Dr Glenetta Hew  or Coletta Memos NP    Other Instructions

## 2022-03-02 ENCOUNTER — Encounter: Payer: Self-pay | Admitting: Cardiology

## 2022-03-02 LAB — COMPREHENSIVE METABOLIC PANEL
ALT: 13 IU/L (ref 0–44)
AST: 12 IU/L (ref 0–40)
Albumin/Globulin Ratio: 2.2 (ref 1.2–2.2)
Albumin: 4.8 g/dL (ref 3.9–4.9)
Alkaline Phosphatase: 119 IU/L (ref 44–121)
BUN/Creatinine Ratio: 23 (ref 10–24)
BUN: 19 mg/dL (ref 8–27)
Bilirubin Total: 0.3 mg/dL (ref 0.0–1.2)
CO2: 24 mmol/L (ref 20–29)
Calcium: 9.2 mg/dL (ref 8.6–10.2)
Chloride: 104 mmol/L (ref 96–106)
Creatinine, Ser: 0.82 mg/dL (ref 0.76–1.27)
Globulin, Total: 2.2 g/dL (ref 1.5–4.5)
Glucose: 184 mg/dL — ABNORMAL HIGH (ref 70–99)
Potassium: 5 mmol/L (ref 3.5–5.2)
Sodium: 141 mmol/L (ref 134–144)
Total Protein: 7 g/dL (ref 6.0–8.5)
eGFR: 96 mL/min/{1.73_m2} (ref >59)

## 2022-03-02 LAB — LIPID PANEL
Chol/HDL Ratio: 2.8 ratio (ref 0.0–5.0)
Cholesterol, Total: 121 mg/dL (ref 100–199)
HDL: 43 mg/dL (ref >39)
LDL Chol Calc (NIH): 55 mg/dL (ref 0–99)
Triglycerides: 131 mg/dL (ref 0–149)
VLDL Cholesterol Cal: 23 mg/dL (ref 5–40)

## 2022-03-02 NOTE — Assessment & Plan Note (Signed)
Taking rosuvastatin 40 mg daily along with Jardiance 25 M and glimepiride.  Not on metformin (unable to tolerate.)  Has not had any labs checked for lipids in a while.  He says his endocrinologist checks his A1c and sugars routinely, but I do not have those labs.  His A1c has been around 7-7.5, but he does not really want to take too much more medications.  Plan: Continue rosuvastatin 40 mg and Jardiance 25 mg daily,  check fasting lipid panel, and CMP

## 2022-03-02 NOTE — Assessment & Plan Note (Signed)
He is now 12 years out from his MI presentation.  Has not had angina or heart failure symptoms since the PCI to the LAD-diagonal.  On stable regimen.  Staying active.  No change

## 2022-03-02 NOTE — Assessment & Plan Note (Signed)
Multivessel CAD with CABG then PCI of the LAD into diagonal branch to complete revascularization.  He had improvement of his EF up to 35 to 40% but unfortunate not over 40%. Continues to do well with no active anginal symptoms.  He is now finally noticing some fatigue which is probably more related to his cancer treatments.  He is far enough out now this point to stop aspirin.  Plan: Continue current dose of carvedilol (splitting the morning dose in half to allow for more energy during the day; 6.25 mg in the a.m., 12.5 mg p.m.) Continue current dose of valsartan 80 mg along with Jardiance 25 mg (dosed for diabetes). Continue 40 mg rosuvastatin-needs lipid check. DC aspirin and continue Plavix 75 mg daily monotherapy. Okay to hold Plavix 5 to 7 days preop for surgeries or procedures.

## 2022-03-02 NOTE — Assessment & Plan Note (Signed)
Okay to discontinue aspirin and continue Plavix.  Okay to hold Plavix 5 to 7 days preop for surgeries or procedures.

## 2022-03-02 NOTE — Assessment & Plan Note (Signed)
EF 35 to 40%, but NYHA class I CHF symptoms.  Only noting fatigue now because of chemotherapy.  Still working 2 jobs.  Although he does say he is starting to "feel his age ".  He is on valsartan and-not able to afford Entresto, along with carvedilol staggered doses noted. Euvolemic taking only Jardiance (diabetes dosing)

## 2022-03-02 NOTE — Assessment & Plan Note (Signed)
Due for carotid Dopplers.  Noted moderate disease in the past.

## 2022-03-02 NOTE — Assessment & Plan Note (Signed)
BP is a little bit up for him today, but he has had a couple cups of coffee in couple cigarettes on his way and he is quite stressed out not mostly.  Overall looks pretty good.  When I increase his blood pressure medications last time he had worsening fatigue, therefore I will just simply continue current meds.

## 2022-03-02 NOTE — Assessment & Plan Note (Signed)
Has not had a Myoview or any Ischemic evaluation since 2015.  He is very active and not having symptoms and is very reluctant to undergo any testing they cost him more money than necessary.  As such, we will simply monitor for symptoms and evaluate if necessary.

## 2022-03-02 NOTE — Assessment & Plan Note (Signed)
Has not had an echo evaluation since 2015.  I do not hear a murmur but I do hear possible S4 gallop.  We will recheck 2D echo.

## 2022-03-15 ENCOUNTER — Ambulatory Visit (HOSPITAL_COMMUNITY)
Admission: RE | Admit: 2022-03-15 | Discharge: 2022-03-15 | Disposition: A | Discharge status: Home / Self Care | Payer: Medicare Other | Source: Ambulatory Visit | Attending: Cardiovascular Disease | Admitting: Cardiovascular Disease

## 2022-03-15 DIAGNOSIS — Z951 Presence of aortocoronary bypass graft: Secondary | ICD-10-CM | POA: Diagnosis present

## 2022-03-15 DIAGNOSIS — Z9889 Other specified postprocedural states: Secondary | ICD-10-CM | POA: Insufficient documentation

## 2022-03-15 DIAGNOSIS — R0989 Other specified symptoms and signs involving the circulatory and respiratory systems: Secondary | ICD-10-CM | POA: Diagnosis present

## 2022-04-12 ENCOUNTER — Ambulatory Visit (HOSPITAL_COMMUNITY): Payer: Medicare Other | Attending: Internal Medicine

## 2022-04-12 DIAGNOSIS — I255 Ischemic cardiomyopathy: Secondary | ICD-10-CM | POA: Insufficient documentation

## 2022-04-12 DIAGNOSIS — I2510 Atherosclerotic heart disease of native coronary artery without angina pectoris: Secondary | ICD-10-CM | POA: Insufficient documentation

## 2022-04-12 DIAGNOSIS — Z951 Presence of aortocoronary bypass graft: Secondary | ICD-10-CM | POA: Diagnosis present

## 2022-04-12 DIAGNOSIS — Z9861 Coronary angioplasty status: Secondary | ICD-10-CM | POA: Insufficient documentation

## 2022-04-12 DIAGNOSIS — Z9889 Other specified postprocedural states: Secondary | ICD-10-CM | POA: Diagnosis present

## 2022-04-12 LAB — ECHOCARDIOGRAM COMPLETE
Area-P 1/2: 2.69 cm2
MV M vel: 4.71 m/s
MV Peak grad: 88.7 mmHg
MV VTI: 0.85 cm2
P 1/2 time: 484 msec
S' Lateral: 4.3 cm

## 2022-04-16 ENCOUNTER — Other Ambulatory Visit: Payer: Self-pay | Admitting: Cardiology

## 2022-06-19 ENCOUNTER — Telehealth: Payer: Self-pay | Admitting: Cardiology

## 2022-06-19 NOTE — Telephone Encounter (Signed)
   Name: Eric Bradshaw  DOB: October 27, 1955  MRN: [id number]  Primary Cardiologist: Glenetta Hew, MD   Preoperative team, please contact this patient and set up a phone call appointment for further preoperative risk assessment. Please obtain consent and complete medication review. Thank you for your help.  I confirm that guidance regarding antiplatelet and oral anticoagulation therapy has been completed and, if necessary, noted below.  Patient's aspirin was discontinued on 03/01/2022.  His Plavix may be held for 5 to 7 days prior to his procedure.  Please resume as soon as hemostasis is achieved   Deberah Pelton, NP 06/19/2022, 4:17 PM [place]

## 2022-06-19 NOTE — Telephone Encounter (Signed)
   Pre-operative Risk Assessment    Patient Name: Eric Bradshaw  DOB: [birth year] MRN: [id number]      Request for Surgical Clearance    Procedure:   TURBT  Date of Surgery:  Clearance 07/02/22                                 Surgeon:  Dr. Jonette Mate Group or Practice Name:  [facility] Phone number:  [phone] Ext. [phone] Fax number:  [phone]   Type of Clearance Requested:   - Medical  - Pharmacy:  Hold Aspirin and Clopidogrel (Plavix) TBD by Cardiologist   Type of Anesthesia:  General    Additional requests/questions:  Please fax a copy of medical clearance to the surgeon's office.  Romilda Garret   06/19/2022, 3:40 PM

## 2022-06-20 NOTE — Telephone Encounter (Signed)
Left message for patient to call back to schedule a tele visit for pre-op.

## 2022-06-21 ENCOUNTER — Other Ambulatory Visit: Payer: Self-pay | Admitting: Urology

## 2022-06-21 ENCOUNTER — Telehealth: Payer: Self-pay | Admitting: *Deleted

## 2022-06-21 NOTE — Telephone Encounter (Signed)
I s.w the pt who has been scheduled for tele pre op appt 06/28/22 '@2'$ :40. Med rec and consent are done. Pt tells me that his procedure has been moved out to April 2/24.

## 2022-06-21 NOTE — Telephone Encounter (Signed)
I s.w the pt who has been scheduled for tele pre op appt 06/28/22 '@2'$ :40. Med rec and consent are done. Pt tells me that his procedure has been moved out to April 2/24.     Patient Consent for Virtual Visit        Eric Bradshaw has provided verbal consent on 06/21/2022 for a virtual visit (video or telephone).   CONSENT FOR VIRTUAL VISIT FOR:  Eric Bradshaw  By participating in this virtual visit I agree to the following:  I hereby voluntarily request, consent and authorize [facility] and its employed or contracted physicians, physician assistants, nurse practitioners or other licensed health care professionals (the Practitioner), to provide me with telemedicine health care services (the "Services") as deemed necessary by the treating Practitioner. I acknowledge and consent to receive the Services by the Practitioner via telemedicine. I understand that the telemedicine visit will involve communicating with the Practitioner through live audiovisual communication technology and the disclosure of certain medical information by electronic transmission. I acknowledge that I have been given the opportunity to request an in-person assessment or other available alternative prior to the telemedicine visit and am voluntarily participating in the telemedicine visit.  I understand that I have the right to withhold or withdraw my consent to the use of telemedicine in the course of my care at any time, without affecting my right to future care or treatment, and that the Practitioner or I may terminate the telemedicine visit at any time. I understand that I have the right to inspect all information obtained and/or recorded in the course of the telemedicine visit and may receive copies of available information for a reasonable fee.  I understand that some of the potential risks of receiving the Services via telemedicine include:  Delay or interruption in medical evaluation due to technological equipment  failure or disruption; Information transmitted may not be sufficient (e.g. poor resolution of images) to allow for appropriate medical decision making by the Practitioner; and/or  In rare instances, security protocols could fail, causing a breach of personal health information.  Furthermore, I acknowledge that it is my responsibility to provide information about my medical history, conditions and care that is complete and accurate to the best of my ability. I acknowledge that Practitioner's advice, recommendations, and/or decision may be based on factors not within their control, such as incomplete or inaccurate data provided by me or distortions of diagnostic images or specimens that may result from electronic transmissions. I understand that the practice of medicine is not an exact science and that Practitioner makes no warranties or guarantees regarding treatment outcomes. I acknowledge that a copy of this consent can be made available to me via my patient portal ([facility]), or I can request a printed copy by calling the office of [facility].    I understand that my insurance will be billed for this visit.   I have read or had this consent read to me. I understand the contents of this consent, which adequately explains the benefits and risks of the Services being provided via telemedicine.  I have been provided ample opportunity to ask questions regarding this consent and the Services and have had my questions answered to my satisfaction. I give my informed consent for the services to be provided through the use of telemedicine in my medical care

## 2022-06-28 ENCOUNTER — Ambulatory Visit: Payer: Medicare Other | Attending: Cardiovascular Disease | Admitting: Nurse Practitioner

## 2022-06-28 DIAGNOSIS — Z01810 Encounter for preprocedural cardiovascular examination: Secondary | ICD-10-CM

## 2022-06-28 NOTE — Progress Notes (Signed)
Virtual Visit via Telephone Note   Because of Eric Bradshaw's co-morbid illnesses, he is at least at moderate risk for complications without adequate follow up.  This format is felt to be most appropriate for this patient at this time.  The patient did not have access to video technology/had technical difficulties with video requiring transitioning to audio format only (telephone).  All issues noted in this document were discussed and addressed.  No physical exam could be performed with this format.  Please refer to the patient's chart for his consent to telehealth for [facility].  Evaluation Performed:  Preoperative cardiovascular risk assessment _____________   Date:  06/28/2022   Patient ID:  Eric Bradshaw, DOB 06-Jun-1955, MRN [id number] Patient Location:  Home Provider location:   Office  Primary Care Provider:  Jenel Lucks, PA-C Primary Cardiologist:  Eric Hew, MD  Chief Complaint / Patient Profile   67 y.o. y/o male with a h/o CAD s/p CABG x 3 in December 2011, DES-LAD and D1 in 2013, [place], hypertension, hyperlipidemia, type 2 diabetes, and bladder cancer who is pending TURBT on 07/16/2022 with Eric Bradshaw of [facility] and presents today for telephonic preoperative cardiovascular risk assessment.  History of Present Illness    Eric Bradshaw is a 67 y.o. male who presents via audio/video conferencing for a telehealth visit today.  Pt was last seen in cardiology clinic on 03/02/2031 by Dr. Ellyn Bradshaw.  At that time Eric Bradshaw was doing well.  The patient is now pending procedure as outlined above. Since his last visit, he has done well from a cardiac standpoint.   He denies chest pain, palpitations, dyspnea, pnd, orthopnea, n, v, dizziness, syncope, edema, weight gain, or early satiety. All other systems reviewed and are otherwise negative except as noted above.   Past Medical History    Past Medical History:  Diagnosis Date   Anemia     CAD S/P percutaneous coronary angioplasty 03/2010 -- 3-07/2011   Proximal LAD ~80% (into D2) & mid - subtotal occlusion , RCA occlusion, LCx occlusion -- s/p CABG x 3  (LIMA-LAD, SVG-OM, SVG-RPDA); Cath 06/2011, patent grafts with severe proximal  LAD - D1 lesions -- 07/2011: PCI  proxLAD-into D2 with Promus Premier DES 2.5 mm x 38 mm (distal in D2 2.5 mm, @ bifurcation - 2.75 m, in prox LAD ~3.0 mm   Cancer (HCC)    bladder   Carotid artery disease ([place])    moderate by 2017 duplex US   CHF (congestive heart failure) ([place])    Chronic combined systolic and diastolic HF (heart failure), NYHA class 2 ([place]) 03/2010; 08/2103   a) EF ~30-30%; Exertional Dyspnea; No exaccerbations.;; b)EF 35-40%, Mod LA dilation   Depression    DM (diabetes mellitus) type II controlled peripheral vascular disorder 04/16/2011   & CAD   Heart murmur    History of kidney stones    HTN, goal below 130/80 03/15/2010   Hyperlipidemia LDL goal < 70    Ischemic cardiomyopathy 03/2010; May 2015   a) Echo - EF 30-40%; mild/mod anterior wall hypokinesis; doppler flow suggestive of impaired LV relaxation; LA moderately dilated; annuloplasty ring noted in mitral position; mild/mod mitral regurgitation; RVsystolic pressure elevated at 30-7mmHg;;; b) Echo May 2015: EF 35-40%   NSTEMI (non-ST elevated myocardial infarction) ([place]) [id number]   Complicated by Severe Acute Systolic CHF -- Class IV CHF,   S/P CABG x 3 03/15/2010   LIMA-mLAD, SVG-OM, SVG-rPDA   S/P  MVR (mitral valve repair) 03/15/2010   @ time of CABG for ischemic MR - 26 mm Edwards physio-2 annuloplasty ring   Tobacco dependence    Initially quit - restarted ~mid 2012; now essentially quit.   Past Surgical History:  Procedure Laterality Date   CATARACT EXTRACTION W/ INTRAOCULAR LENS IMPLANT Right    CORONARY ARTERY BYPASS GRAFT  03/15/2010   LIMA-LAD, SVG-OM, SVG-RPDA   CYSTOSCOPY N/A 08/14/2021   Procedure: CYSTOSCOPY;  Surgeon: Eric Fries, MD;  Location:  [facility];  Service: Urology;  Laterality: N/A;   FULGURATION OF BLADDER TUMOR N/A 02/22/2021   Procedure: CYSTOSCOPY/CLOT EVACTION/FULGURATION OF BLADDER TUMOR;  Surgeon: Eric Fries, MD;  Location: [facility];  Service: Urology;  Laterality: N/A;   Left Arm Surgery - NOS     LEFT HEART CATHETERIZATION WITH CORONARY/GRAFT ANGIOGRAM  07/03/2011   Procedure: LEFT HEART CATHETERIZATION WITH Beatrix Fetters;  Surgeon: Eric Man, MD;  Location[facility] C[facility] LAB;  Service: Cardiovascular;Patent LIMA-LAD, SVG-OM, SVG-RPDA; (native RCA & OM prox 100%, prox LAD into major D2 ~80% long lesion, midLAD 100%   MITRAL VALVULOPLASTY  03/15/2010   conjuntive with CABG for Severe MR; 26 mm Edwards physio-2 annuloplasty ring   NM MYOVIEW LTD  04/24/2011   R/L MV - EF 37%; mild perfusion defect due to attenuation w/mild to mod superimposed ischemia seen in apex, apical lateral and distal to mid ateroseptal region; LV systolic fcn moderately reduced; hypotensive BP response to stress w/o chest pain; high risk scan   PERCUTANEOUS CORONARY STENT INTERVENTION (PCI-S) N/A 08/02/2011   Procedure: PERCUTANEOUS CORONARY STENT INTERVENTION (PCI-S);  Surgeon: Eric Man, MD;  Location: [facility] C[facility] LAB;  Service: Cardiovascular;  PCI proximal LAD-D2: Promus Premier DES 2.5 x 38 (tapered post-dilation from 3.0 mm in proximal LAD, 2.75 mm at bifurcation and 2.5 mm in D2)   TONSILLECTOMY     Childhood   TRANSTHORACIC ECHOCARDIOGRAM  03/23/2010   Pre-CABG/MVR: EF 25-30%; global HK; Mod-Severe MR   TRANSTHORACIC ECHOCARDIOGRAM  09/2011; 08/2013   a) EF up to 30-40% (post PCI); mild-mod Ant wall HK; Gd 1 DD; MV Annuloplasty ring in place, mild-mod MR; mild PHTN (30-40 mmHg);; b)  EF 35-40% with moderately reduced function. Moderately dilated LV with distal septal-apical, mid and basal inferior akinesis; status post mitral valve ring. Significant diastolic gradient with mild MR. Moderate LA dilation.     TRANSURETHRAL  RESECTION OF BLADDER TUMOR N/A 01/30/2021   Procedure: RESTAGING TRANSURETHRAL RESECTION OF BLADDER TUMOR (TURBT);  Surgeon: Eric Fries, MD;  Location: [facility];  Service: Urology;  Laterality: N/A;  1 HR   TRANSURETHRAL RESECTION OF BLADDER TUMOR N/A 02/20/2021   Procedure: TRANSURETHRAL RESECTION OF BLADDER TUMOR (TURBT), RESTAGING;  Surgeon: Eric Fries, MD;  Location: [facility];  Service: Urology;  Laterality: N/A;  GENERAL ANESTHESIA WITH PARALYSIS   TRANSURETHRAL RESECTION OF BLADDER TUMOR N/A 08/14/2021   Procedure: TRANSURETHRAL RESECTION OF BLADDER TUMOR;  Surgeon: Eric Fries, MD;  Location: [facility];  Service: Urology;  Laterality: N/A;  75 MINS   TRANSURETHRAL RESECTION OF BLADDER TUMOR N/A 02/12/2022   Procedure: TRANSURETHRAL RESECTION OF BLADDER TUMOR (TURBT);  Surgeon: Eric Fries, MD;  Location: [facility];  Service: Urology;  Laterality: N/A;  1 HR   TRANSURETHRAL RESECTION OF BLADDER TUMOR WITH MITOMYCIN-C N/A 12/26/2020   Procedure: TRANSURETHRAL RESECTION OF BLADDER TUMOR WITH GEMCITABINE;  Surgeon: Eric Fries, MD;  Location: [facility];  Service: Urology;  Laterality: N/A;  Allergies  Allergies  Allergen Reactions   Morphine And Related Other (See Comments)    HEADACHE    Home Medications    Prior to Admission medications   Medication Sig Start Date End Date Taking? Authorizing Provider  Ascorbic Acid (VITAMIN C) 1000 MG tablet Take 1,000 mg by mouth in the morning.    [provider]  aspirin EC 81 MG tablet Take 81 mg by mouth daily. Swallow whole.    [provider]  B Complex-C (SUPER B COMPLEX PO) Take 1 capsule by mouth daily.    [provider]  carvedilol (COREG) 12.5 MG tablet TAKE 1/2 TABLET BY MOUTH EVERY MORNING AND 1 TABLET AT NIGHT 08/15/21   Eric Man, MD  Cholecalciferol (VITAMIN D-3) 125 MCG (5000 UT) TABS Take 5,000 Units by mouth in the morning.    [provider]  clopidogrel (PLAVIX) 75  MG tablet Take 75 mg by mouth daily. 06/29/21   [provider]  Echinacea 400 MG CAPS Take 400 mg by mouth in the morning.    [provider]  Garlic (GARLIQUE PO) Take 1 tablet by mouth in the morning.    [provider]  glimepiride (AMARYL) 4 MG tablet Take 4 mg by mouth 2 (two) times daily. 01/21/20   [provider]  HYDROcodone-acetaminophen (NORCO/VICODIN) 5-325 MG tablet Take 1 tablet by mouth every 6 (six) hours as needed for moderate pain. Patient not taking: Reported on 03/01/2022 08/14/21 08/14/22  Jacalyn Lefevre D, MD  ibuprofen (ADVIL) 200 MG tablet Take 200-400 mg by mouth every 6 (six) hours as needed for moderate pain.    [provider]  JARDIANCE 25 MG TABS tablet Take 25 mg by mouth in the morning. 03/23/20   [provider]  Magnesium 500 MG TABS Take 500 mg by mouth in the morning.    [provider]  Multiple Vitamins-Minerals (MULTIVITAMINS THER. W/MINERALS) TABS Take 1 tablet by mouth daily.    [provider]  Omega-3 Fatty Acids (FISH OIL) 1000 MG CAPS Take 1,000 mg by mouth in the morning.    [provider]  rosuvastatin (CRESTOR) 40 MG tablet Take 1 tablet (40 mg total) by mouth daily. Patient taking differently: Take 40 mg by mouth every evening. 03/18/19 12/23/22  Eric Man, MD  RYBELSUS 7 MG TABS Take 1 tablet by mouth daily. 05/03/22   [provider]  SPIRIVA HANDIHALER 18 MCG inhalation capsule 1 capsule daily.    [provider]  traMADol (ULTRAM) 50 MG tablet Take 1 tablet (50 mg total) by mouth every 6 (six) hours as needed. Patient not taking: Reported on 03/01/2022 02/12/22 02/12/23  Eric Fries, MD  Turmeric 500 MG CAPS Take 500 mg by mouth daily.    [provider]  valsartan (DIOVAN) 80 MG tablet TAKE 1 TABLET(80 MG) BY MOUTH DAILY 04/16/22   Eric Man, MD  vitamin E 1000 UNIT capsule Take 1,000 Units by mouth daily.    [provider]  Zinc 50 MG TABS Take 50 mg by mouth in the morning.    [provider]    Physical Exam    Vital Signs:  Eric Bradshaw does not have vital signs available for review today.  Given telephonic nature of communication, physical exam is limited. AAOx3. NAD. Normal affect.  Speech and respirations are unlabored.  Accessory Clinical Findings    None  Assessment & Plan    1.  Preoperative Cardiovascular Risk Assessment:  According to the Revised Cardiac Risk Index (RCRI), his Perioperative Risk of Major Cardiac Event is (%): 6.6. His Functional Capacity in METs is: 8.97 according to the [facility] Activity Status Index (DASI). Therefore, based on ACC/AHA guidelines, patient would be at acceptable risk for the planned procedure without further cardiovascular testing.  The patient was advised that if he develops new symptoms prior to surgery to contact our office to arrange for a follow-up visit, and he verbalized understanding.  His Plavix and Aspirin may be held for 5 to 7 days prior to his procedure. Please resume as soon as hemostasis is achieved.   A copy of this note will be routed to requesting surgeon.  Time:   Today, I have spent 5 minutes with the patient with telehealth technology discussing medical history, symptoms, and management plan.     Lenna Sciara, NP  06/28/2022, 2:48 PM

## 2022-07-09 NOTE — Progress Notes (Addendum)
Reviewing pt chart for pre-op interview.  Noted pt has extensive cardiac medical history including ef 35%. Per anesthesia guideline for ambulatory surgery center this pt is not a candidate for [facility].  Called and left message for Janett Billow, [place] scheduler for Dr Claudia Desanctis, informed her that this needs to be done at main OR.

## 2022-07-11 NOTE — Patient Instructions (Addendum)
SURGICAL WAITING ROOM VISITATION  Patients having surgery or a procedure may have no more than 2 support people in the waiting area - these visitors may rotate.    Children under the age of 2 must have an adult with them who is not the patient.  Due to an increase in RSV and influenza rates and associated hospitalizations, children ages 70 and under may not visit patients in [place].  If the patient needs to stay at the hospital during part of their recovery, the visitor guidelines for inpatient rooms apply. Pre-op nurse will coordinate an appropriate time for 1 support person to accompany patient in pre-op.  This support person may not rotate.    Please refer to the [facility] website for the visitor guidelines for Inpatients (after your surgery is over and you are in a regular room).       Your procedure is scheduled on: 07-16-22   Report to [facility] Main Entrance    Report to admitting at      [place]  AM   Call this number if you have problems the morning of surgery [phone]   Do not eat food  or drink liquids :After Midnight.          If you have questions, please contact your surgeon's office.   FOLLOW  ANY ADDITIONAL PRE OP INSTRUCTIONS YOU RECEIVED FROM YOUR SURGEON'S OFFICE!!!     Oral Hygiene is also important to reduce your risk of infection.                                    Remember - BRUSH YOUR TEETH THE MORNING OF SURGERY WITH YOUR REGULAR TOOTHPASTE  DENTURES WILL BE REMOVED PRIOR TO SURGERY PLEASE DO NOT APPLY "Poly grip" OR ADHESIVES!!!   Do NOT smoke after Midnight   Take these medicines the morning of surgery with A SIP OF WATER: inhaler , carvedilol  DO NOT TAKE ANY ORAL DIABETIC MEDICATIONS DAY OF YOUR SURGERY  How to Manage Your Diabetes Before and After Surgery  Why is it important to control my blood sugar before and after surgery? Improving blood sugar levels before and after surgery helps healing and can limit  problems. A way of improving blood sugar control is eating a healthy diet by:  Eating less sugar and carbohydrates  Increasing activity/exercise  Talking with your doctor about reaching your blood sugar goals High blood sugars (greater than 180 mg/dL) can raise your risk of infections and slow your recovery, so you will need to focus on controlling your diabetes during the weeks before surgery. Make sure that the doctor who takes care of your diabetes knows about your planned surgery including the date and location.  How do I manage my blood sugar before surgery? Check your blood sugar at least 4 times a day, starting 2 days before surgery, to make sure that the level is not too high or low. Check your blood sugar the morning of your surgery when you wake up and every 2 hours until you get to the Short Stay unit. If your blood sugar is less than 70 mg/dL, you will need to treat for low blood sugar: Do not take insulin. Treat a low blood sugar (less than 70 mg/dL) with  cup of clear juice (cranberry or apple), 4 glucose tablets, OR glucose gel. Recheck blood sugar in 15 minutes after treatment (to make sure  it is greater than 70 mg/dL). If your blood sugar is not greater than 70 mg/dL on recheck, call [phone] for further instructions. Report your blood sugar to the short stay nurse when you get to Short Stay.  If you are admitted to the hospital after surgery: Your blood sugar will be checked by the staff and you will probably be given insulin after surgery (instead of oral diabetes medicines) to make sure you have good blood sugar levels. The goal for blood sugar control after surgery is 80-180 mg/dL.   WHAT DO I DO ABOUT MY DIABETES MEDICATION?  Do not take oral diabetes medicines (pills) the morning of surgery.        GLIMEPIRIDE TAKE ONLY MORNING/LUNCH DOSE DAY BEFORE SURGERY NO EVENING DOSE AND NONE DAY OF SURGERY  HOLD JARDIANCE 72 HOURS PRIOR TO SURGERY  LAST DOSE   07-12-22  DO NOT TAKE THE FOLLOWING 24 HOURS PRIOR TO SURGERY: , Rybelsus (Semaglutide), LAST DOSE 07-14-22  RESTART THE FOLLOWING DAY AFTER PROCEDURE                              You may not have any metal on your body including hair pins, jewelry, and body piercing             Do not wear , lotions, powders, /cologne, or deodorant                Men may shave face and neck.   Do not bring valuables to the hospital. [place].   Contacts, glasses, dentures or bridgework may not be worn into surgery.   Bring small overnight bag day of surgery.   DO NOT [place]. PHARMACY WILL DISPENSE MEDICATIONS LISTED ON YOUR MEDICATION LIST TO YOU DURING YOUR ADMISSION [place]!    Patients discharged on the day of surgery will not be allowed to drive home.  Someone NEEDS to stay with you for the first 24 hours after anesthesia.   Special Instructions: Bring a copy of your healthcare power of attorney and living will documents the day of surgery if you haven't scanned them before.              Please read over the following fact sheets you were given: IF [place] [phone]    If you test positive for Covid or have been in contact with anyone that has tested positive in the last 10 days please notify you surgeon.    [place] - Preparing for Surgery Before surgery, you can play an important role.  Because skin is not sterile, your skin needs to be as free of germs as possible.  You can reduce the number of germs on your skin by washing with CHG (chlorahexidine gluconate) soap before surgery.  CHG is an antiseptic cleaner which kills germs and bonds with the skin to continue killing germs even after washing. Please DO NOT use if you have an allergy to CHG or antibacterial soaps.  If your skin becomes reddened/irritated stop using the CHG and inform your  nurse when you arrive at Short Stay. Do not shave (including legs and underarms) for at least 48 hours prior to the first CHG shower.  You may shave your face/neck. Please follow these instructions  carefully:  1.  Shower with CHG Soap the night before surgery and the  morning of Surgery.  2.  If you choose to wash your hair, wash your hair first as usual with your  normal  shampoo.  3.  After you shampoo, rinse your hair and body thoroughly to remove the  shampoo.                           4.  Use CHG as you would any other liquid soap.  You can apply chg directly  to the skin and wash                       Gently with a scrungie or clean washcloth.  5.  Apply the CHG Soap to your body ONLY FROM THE NECK DOWN.   Do not use on face/ open                           Wound or open sores. Avoid contact with eyes, ears mouth and genitals (private parts).                       Wash face,  Genitals (private parts) with your normal soap.             6.  Wash thoroughly, paying special attention to the area where your surgery  will be performed.  7.  Thoroughly rinse your body with warm water from the neck down.  8.  DO NOT shower/wash with your normal soap after using and rinsing off  the CHG Soap.                9.  Pat yourself dry with a clean towel.            10.  Wear clean pajamas.            11.  Place clean sheets on your bed the night of your first shower and do not  sleep with pets. Day of Surgery : Do not apply any lotions/deodorants the morning of surgery.  Please wear clean clothes to the hospital/surgery center.  FAILURE TO FOLLOW THESE INSTRUCTIONS MAY RESULT IN THE CANCELLATION OF YOUR SURGERY PATIENT SIGNATURE_________________________________  NURSE SIGNATURE__________________________________  ________________________________________________________________________

## 2022-07-11 NOTE — Progress Notes (Addendum)
PCP - Jenel Lucks. PA-C Cardiologist - 06-28-22 epic clearance  Diona Browner, NP  Glenetta Hew  PPM/ICD -  Device Orders -  Rep Notified -   Chest x-ray -  EKG - 08-10-21 epic Stress Test - 2013 ECHO - 04-12-22 epic Cardiac Cath -   Sleep Study -  CPAP -   Fasting Blood Sugar - 160 Checks Blood Sugar _sometimes____   Blood Thinner Instructions:Plavix and ASA hold 5-to 7 days preop  last dose 07-02-22 stopped plavix stopped asa 07-06-22 Aspirin Instructions:   Has not take any diabetic medicine since 07-11-22  ERAS Protcol - PRE-SURGERY   COVID vaccine -yes  Activity--Able to climb a flight of stairs without SOB or CP Anesthesia review: CHF, CABG 2011, MI, CAD,HTN, PVD, Smoker  Patient denies shortness of breath, fever, cough and chest pain at PAT appointment   All instructions explained to the patient, with a verbal understanding of the material. Patient agrees to go over the instructions while at home for a better understanding. Patient also instructed to self quarantine after being tested for COVID-19. The opportunity to ask questions was provided.

## 2022-07-15 ENCOUNTER — Encounter (HOSPITAL_COMMUNITY): Payer: Self-pay

## 2022-07-15 ENCOUNTER — Encounter (HOSPITAL_COMMUNITY)
Admission: RE | Admit: 2022-07-15 | Discharge: 2022-07-15 | Disposition: A | Discharge status: Home / Self Care | Payer: Medicare Other | Source: Ambulatory Visit | Attending: Urology | Admitting: Urology

## 2022-07-15 ENCOUNTER — Other Ambulatory Visit: Payer: Self-pay

## 2022-07-15 VITALS — BP 140/75 | HR 81 | Temp 98.6°F | Resp 16 | Ht 67.0 in | Wt 160.0 lb

## 2022-07-15 DIAGNOSIS — I255 Ischemic cardiomyopathy: Secondary | ICD-10-CM | POA: Diagnosis not present

## 2022-07-15 DIAGNOSIS — Z01812 Encounter for preprocedural laboratory examination: Secondary | ICD-10-CM | POA: Insufficient documentation

## 2022-07-15 DIAGNOSIS — C679 Malignant neoplasm of bladder, unspecified: Secondary | ICD-10-CM | POA: Insufficient documentation

## 2022-07-15 DIAGNOSIS — E119 Type 2 diabetes mellitus without complications: Secondary | ICD-10-CM | POA: Insufficient documentation

## 2022-07-15 DIAGNOSIS — Z951 Presence of aortocoronary bypass graft: Secondary | ICD-10-CM | POA: Diagnosis not present

## 2022-07-15 DIAGNOSIS — Z952 Presence of prosthetic heart valve: Secondary | ICD-10-CM | POA: Insufficient documentation

## 2022-07-15 DIAGNOSIS — Z7984 Long term (current) use of oral hypoglycemic drugs: Secondary | ICD-10-CM | POA: Insufficient documentation

## 2022-07-15 DIAGNOSIS — I2510 Atherosclerotic heart disease of native coronary artery without angina pectoris: Secondary | ICD-10-CM | POA: Insufficient documentation

## 2022-07-15 HISTORY — DX: Gastro-esophageal reflux disease without esophagitis: K21.9

## 2022-07-15 LAB — CBC
HCT: 44.3 % (ref 39.0–52.0)
Hemoglobin: 14.7 g/dL (ref 13.0–17.0)
MCH: 30.9 pg (ref 26.0–34.0)
MCHC: 33.2 g/dL (ref 30.0–36.0)
MCV: 93.1 fL (ref 80.0–100.0)
Platelets: 170 10*3/uL (ref 150–400)
RBC: 4.76 MIL/uL (ref 4.22–5.81)
RDW: 12.7 % (ref 11.5–15.5)
WBC: 8.8 10*3/uL (ref 4.0–10.5)
nRBC: 0 % (ref 0.0–0.2)

## 2022-07-15 LAB — BASIC METABOLIC PANEL
Anion gap: 8 (ref 5–15)
BUN: 26 mg/dL — ABNORMAL HIGH (ref 8–23)
CO2: 23 mmol/L (ref 22–32)
Calcium: 9 mg/dL (ref 8.9–10.3)
Chloride: 103 mmol/L (ref 98–111)
Creatinine, Ser: 0.7 mg/dL (ref 0.61–1.24)
GFR, Estimated: >60 mL/min (ref >60)
Glucose, Bld: 251 mg/dL — ABNORMAL HIGH (ref 70–99)
Potassium: 4.4 mmol/L (ref 3.5–5.1)
Sodium: 134 mmol/L — ABNORMAL LOW (ref 135–145)

## 2022-07-15 LAB — GLUCOSE, CAPILLARY: Glucose-Capillary: 296 mg/dL — ABNORMAL HIGH (ref 70–99)

## 2022-07-15 NOTE — Progress Notes (Signed)
Anesthesia Chart Review   Case: [id number] Date/Time: 07/16/22 0819   Procedure: TRANSURETHRAL RESECTION OF BLADDER TUMOR (TURBT) - 39 MINUTES   Anesthesia type: General   Pre-op diagnosis: BLADDER CANCER   Location: [facility] 03 / [facility]   Surgeons: Robley Fries, MD       DISCUSSION:66 y.o. smoker with h/o CAD (s/p CABG x 3 in December 2011, DES-LAD and D1 in 2013), ischemic cardiomyopathy, s/p MVR 2011, DM II, bladder cancer scheduled for above procedure 07/16/22 with Dr. Jacalyn Lefevre.   Pt last seen by cardiology 06/28/2022. Per OV note, "According to the Revised Cardiac Risk Index (RCRI), his Perioperative Risk of Major Cardiac Event is (%): 6.6. His Functional Capacity in METs is: 8.97 according to the Duke Activity Status Index (DASI). Therefore, based on ACC/AHA guidelines, patient would be at acceptable risk for the planned procedure without further cardiovascular testing.   The patient was advised that if he develops new symptoms prior to surgery to contact our office to arrange for a follow-up visit, and he verbalized understanding.   His Plavix and Aspirin may be held for 5 to 7 days prior to his procedure. Please resume as soon as hemostasis is achieved."  Last dose of Plavix 07/06/2022.   Elevated glucose at PAT visit, pt reports only have coffee with creamer prior to coming in.  He reports he has been holding diabetes medications in prep for surgery.  Nurse reviewed appropriate management of DM medications.  Evaluate DOS.  Last A1C 8.0 on 05/03/2022.  VS: BP (!) 140/75   Pulse 81   Temp 37 C (Oral)   Resp 16   Ht 5\' 7"  (1.702 m)   Wt 72.6 kg   SpO2 99%   BMI 25.06 kg/m   PROVIDERS: Jenel Lucks, PA-C is PCP   Primary Cardiologist:  Glenetta Hew, MD  LABS: Labs reviewed: Acceptable for surgery. (all labs ordered are listed, but only abnormal results are displayed)  Labs Reviewed  BASIC METABOLIC PANEL - Abnormal; Notable for the following  components:      Result Value   Sodium 134 (*)    Glucose, Bld 251 (*)    BUN 26 (*)    All other components within normal limits  GLUCOSE, CAPILLARY - Abnormal; Notable for the following components:   Glucose-Capillary 296 (*)    All other components within normal limits  CBC  HEMOGLOBIN A1C     IMAGES:   EKG:   CV: Echo 04/12/2022 1. Left ventricular ejection fraction, by estimation, is 35 to 40%. Left  ventricular ejection fraction by 3D volume is 37 %. The left ventricle has  moderately decreased function. The left ventricle demonstrates regional  wall motion abnormalities (see  scoring diagram/findings for description). Left ventricular diastolic  function could not be evaluated. There is severe akinesis of the left  ventricular, basal-mid inferoseptal wall, inferior wall and inferolateral  wall.   2. Right ventricular systolic function is moderately reduced. The right  ventricular size is mildly enlarged. There is moderately elevated  pulmonary artery systolic pressure. The estimated right ventricular  systolic pressure is [id number] mmHg.   3. The mitral valve has been repaired/replaced. Mild mitral valve  regurgitation. Moderate to severe mitral stenosis. The mean mitral valve  gradient is 14.0 mmHg. There is a Mitral valvuloplasty present in the  mitral position. Procedure Date: 03/15/2010.   4. The aortic valve is tricuspid. Aortic valve regurgitation is trivial.  Aortic valve sclerosis/calcification is present, without  any evidence of  aortic stenosis. Aortic regurgitation PHT measures 484 msec.   5. The inferior vena cava is normal in size with greater than 50%  respiratory variability, suggesting right atrial pressure of 3 mmHg.  Past Medical History:  Diagnosis Date   Anemia    CAD S/P percutaneous coronary angioplasty 03/2010 -- 3-07/2011   Proximal LAD ~80% (into D2) & mid - subtotal occlusion , RCA occlusion, LCx occlusion -- s/p CABG x 3  (LIMA-LAD, SVG-OM,  SVG-RPDA); Cath 06/2011, patent grafts with severe proximal  LAD - D1 lesions -- 07/2011: PCI  proxLAD-into D2 with Promus Premier DES 2.5 mm x 38 mm (distal in D2 2.5 mm, @ bifurcation - 2.75 m, in prox LAD ~3.0 mm   Cancer    bladder   Carotid artery disease    moderate by 2017 duplex US   CHF (congestive heart failure)    Chronic combined systolic and diastolic HF (heart failure), NYHA class 2 03/2010; 08/2103   a) EF ~30-30%; Exertional Dyspnea; No exaccerbations.;; b)EF 35-40%, Mod LA dilation   Depression    DM (diabetes mellitus) type II controlled peripheral vascular disorder 04/16/2011   & CAD   pt. denies   GERD (gastroesophageal reflux disease)    History of kidney stones    HTN, goal below 130/80 03/15/2010   Hyperlipidemia LDL goal < 70    Ischemic cardiomyopathy 03/2010; May 2015   a) Echo - EF 30-40%; mild/mod anterior wall hypokinesis; doppler flow suggestive of impaired LV relaxation; LA moderately dilated; annuloplasty ring noted in mitral position; mild/mod mitral regurgitation; RVsystolic pressure elevated at 30-55mmHg;;; b) Echo May 2015: EF 35-40%   NSTEMI (non-ST elevated myocardial infarction) [id number]   Complicated by Severe Acute Systolic CHF -- Class IV CHF,   S/P CABG x 3 03/15/2010   LIMA-mLAD, SVG-OM, SVG-rPDA   S/P MVR (mitral valve repair) 03/15/2010   @ time of CABG for ischemic MR - 26 mm Edwards physio-2 annuloplasty ring   Tobacco dependence    Initially quit - restarted ~mid 2012;    Past Surgical History:  Procedure Laterality Date   CATARACT EXTRACTION W/ INTRAOCULAR LENS IMPLANT Right    CORONARY ARTERY BYPASS GRAFT  03/15/2010   LIMA-LAD, SVG-OM, SVG-RPDA   CYSTOSCOPY N/A 08/14/2021   Procedure: CYSTOSCOPY;  Surgeon: Robley Fries, MD;  Location: [facility];  Service: Urology;  Laterality: N/A;   EYE SURGERY     cataract   FULGURATION OF BLADDER TUMOR N/A 02/22/2021   Procedure: CYSTOSCOPY/CLOT EVACTION/FULGURATION OF BLADDER TUMOR;   Surgeon: Robley Fries, MD;  Location: [facility];  Service: Urology;  Laterality: N/A;   Left Arm Surgery - NOS     LEFT HEART CATHETERIZATION WITH CORONARY/GRAFT ANGIOGRAM  07/03/2011   Procedure: LEFT HEART CATHETERIZATION WITH Beatrix Fetters;  Surgeon: Leonie Man, MD;  Location: [facility] CATH LAB;  Service: Cardiovascular;Patent LIMA-LAD, SVG-OM, SVG-RPDA; (native RCA & OM prox 100%, prox LAD into major D2 ~80% long lesion, midLAD 100%   MITRAL VALVULOPLASTY  03/15/2010   conjuntive with CABG for Severe MR; 26 mm Edwards physio-2 annuloplasty ring   NM MYOVIEW LTD  04/24/2011   R/L MV - EF 37%; mild perfusion defect due to attenuation w/mild to mod superimposed ischemia seen in apex, apical lateral and distal to mid ateroseptal region; LV systolic fcn moderately reduced; hypotensive BP response to stress w/o chest pain; high risk scan   PERCUTANEOUS CORONARY STENT INTERVENTION (PCI-S) N/A 08/02/2011   Procedure: PERCUTANEOUS CORONARY  STENT INTERVENTION (PCI-S);  Surgeon: Leonie Man, MD;  Location: [facility] CATH LAB;  Service: Cardiovascular;  PCI proximal LAD-D2: Promus Premier DES 2.5 x 38 (tapered post-dilation from 3.0 mm in proximal LAD, 2.75 mm at bifurcation and 2.5 mm in D2)   personal history of chemo     TONSILLECTOMY     Childhood   TRANSTHORACIC ECHOCARDIOGRAM  03/23/2010   Pre-CABG/MVR: EF 25-30%; global HK; Mod-Severe MR   TRANSTHORACIC ECHOCARDIOGRAM  09/2011; 08/2013   a) EF up to 30-40% (post PCI); mild-mod Ant wall HK; Gd 1 DD; MV Annuloplasty ring in place, mild-mod MR; mild PHTN (30-40 mmHg);; b)  EF 35-40% with moderately reduced function. Moderately dilated LV with distal septal-apical, mid and basal inferior akinesis; status post mitral valve ring. Significant diastolic gradient with mild MR. Moderate LA dilation.     TRANSURETHRAL RESECTION OF BLADDER TUMOR N/A 01/30/2021   Procedure: RESTAGING TRANSURETHRAL RESECTION OF BLADDER TUMOR (TURBT);  Surgeon: Robley Fries, MD;  Location: [facility];  Service: Urology;  Laterality: N/A;  1 HR   TRANSURETHRAL RESECTION OF BLADDER TUMOR N/A 02/20/2021   Procedure: TRANSURETHRAL RESECTION OF BLADDER TUMOR (TURBT), RESTAGING;  Surgeon: Robley Fries, MD;  Location: [facility];  Service: Urology;  Laterality: N/A;  GENERAL ANESTHESIA WITH PARALYSIS   TRANSURETHRAL RESECTION OF BLADDER TUMOR N/A 08/14/2021   Procedure: TRANSURETHRAL RESECTION OF BLADDER TUMOR;  Surgeon: Robley Fries, MD;  Location: [facility];  Service: Urology;  Laterality: N/A;  75 MINS   TRANSURETHRAL RESECTION OF BLADDER TUMOR N/A 02/12/2022   Procedure: TRANSURETHRAL RESECTION OF BLADDER TUMOR (TURBT);  Surgeon: Robley Fries, MD;  Location: [facility];  Service: Urology;  Laterality: N/A;  1 HR   TRANSURETHRAL RESECTION OF BLADDER TUMOR WITH MITOMYCIN-C N/A 12/26/2020   Procedure: TRANSURETHRAL RESECTION OF BLADDER TUMOR WITH GEMCITABINE;  Surgeon: Robley Fries, MD;  Location: [facility];  Service: Urology;  Laterality: N/A;    MEDICATIONS:  Ascorbic Acid (VITAMIN C) 1000 MG tablet   aspirin EC 81 MG tablet   B Complex-C (SUPER B COMPLEX PO)   carvedilol (COREG) 12.5 MG tablet   Cholecalciferol (VITAMIN D-3) 125 MCG (5000 UT) TABS   clopidogrel (PLAVIX) 75 MG tablet   Echinacea [id number] MG CAPS   Garlic (GARLIQUE PO)   glimepiride (AMARYL) 4 MG tablet   ibuprofen (ADVIL) 200 MG tablet   JARDIANCE 25 MG TABS tablet   Magnesium 500 MG TABS   Multiple Vitamins-Minerals (MULTIVITAMINS THER. W/MINERALS) TABS   Omega-3 Fatty Acids (FISH OIL) 1000 MG CAPS   rosuvastatin (CRESTOR) 40 MG tablet   RYBELSUS 7 MG TABS   SPIRIVA HANDIHALER 18 MCG inhalation capsule   Turmeric 500 MG CAPS   valsartan (DIOVAN) 80 MG tablet   vitamin E 1000 UNIT capsule   Zinc 50 MG TABS   No current facility-administered medications for this encounter.     Konrad Felix Ward, PA-C [facility] [phone]

## 2022-07-15 NOTE — H&P (Signed)
CC/HPI: cc: bladder cancer   Initial TURBT with gemcitabine 12/26/20  Restaging TURBT 01/30/21  Restaging TURBT 02/20/21  Clot evac 02/22/21  November 2023 repeat TURBT - path benign  Induction BCG completed February 2023  Maintenance BCG June 2023, December 2023   03/01/21: 67 year old man with a history of high-grade T1 bladder cancer here for follow-up after second restaging TURBT followed by clot evacuation. His pathology report was reviewed by 2 different pathologist and no muscle invasive disease was identified. He is here today for voiding trial. He overall feels week following hospitalization and surgery last week.   07/24/21: 67 year old man with a history of high-grade T1 bladder cancer who recently completed induction BCG here for surveillance cystoscopy. Patient has put on 10 pounds in the last few months and overall feeling better. He denies any gross hematuria.   08/22/21: 67 year old man with a history of high-grade T1 bladder cancer s/p TURBT for concern for recurrence. Pathology benign. Patient is still voiding light pink urine but no clots.   12/07/21: 67 year old man with a history of high-grade T1 bladder cancer here for surveillance. His last maintenance BCG was completed in June 2023. He is working on cutting back on smoking but when he is anxious he does smoke more. No gross hematuria.   01/25/2022: 67 year old man with a history of high-grade T1 bladder cancer here for surveillance. He just finished maintenance BCG. He is overall feeling good. At last cystoscopy in August 2023 there was a poorly healed area on the left posterior bladder wall. We discussed biopsying it versus repeat surveillance cystoscopy in a month.   02/22/2022: 67 year old man with a history of high-grade T1 bladder cancer currently on maintenance BCG with concern for recurrence on last surveillance cystoscopy. He underwent cystoscopy with repeat TURBT. Pathology did not show recurrence.   06/07/2022:  67 year old man with a history of high-grade T1 bladder cancer currently on maintenance BCG here for surveillance cystoscopy. Last maintenance BCG was completed December 2023. Patient had episode of gross hematuria at the end of January 2024. He using tobacco/EtOH daily.     ALLERGIES: Morphine Sulfate    MEDICATIONS: Aspirin 81 mg tablet,chewable  Plavix 75 mg tablet  Carvedilol  Glimepiride  Jardiance  Rosuvastatin Calcium  Valsartan     GU PSH: Bladder Instill AntiCA Agent - 04/12/2022, 04/05/2022, 03/29/2022, 01/11/2022, 01/01/2022, 12/24/2021, 10/03/2021, 09/25/2021, 09/11/2021, 06/12/2021, 06/05/2021, 05/29/2021, 05/22/2021, 05/15/2021, 04/26/2021, 04/03/2021 Cystoscopy - 01/25/2022, 12/07/2021, 07/24/2021, 11/30/2020 Cystoscopy TURBT <2 cm - 02/12/2022, 08/14/2021 Cystoscopy TURBT >5 cm - 02/20/2021 Cystoscopy TURBT 2-5 cm - 01/30/2021, 12/26/2020 Locm 300-399Mg /Ml Iodine,1Ml - 12/01/2020       PSH Notes: Bypass     NON-GU PSH: Cardiac Stent Placement     GU PMH: Bladder Cancer overlapping sites - 04/12/2022, - 04/05/2022, - 03/29/2022, - 03/22/2022, - 02/22/2022, - 01/25/2022, - 01/11/2022, - 01/01/2022, - 12/24/2021, - 12/07/2021, - 10/03/2021, - 09/25/2021, - 09/18/2021, - 09/11/2021, - 08/22/2021, - 07/24/2021, - 06/12/2021, - 06/05/2021, - 05/22/2021, - 05/15/2021, - 04/26/2021, - 04/18/2021, - 04/03/2021, - 03/27/2021, - 03/01/2021, - 02/20/2021, - 02/12/2021, - 01/12/2021 Gross hematuria - 02/20/2021, - 12/01/2020, - 11/30/2020 Bladder tumor/neoplasm - 12/01/2020, - 11/30/2020    NON-GU PMH: Tobacco abuse counseling - 08/22/2021 Cardiac murmur, unspecified Diabetes Type 2 Heart disease, unspecified Hypertension Myocardial Infarction Other heart failure    FAMILY HISTORY: 1 Daughter - Runs in Family 1 son - Runs in Family Kidney Failure - Runs in Family   SOCIAL HISTORY: Marital Status: Unknown Current Smoking Status:  Patient does not smoke anymore. Has not smoked since 11/14/2010.   Tobacco Use  Assessment Completed: Used Tobacco in last 30 days? Does drink.  Drinks 4+ caffeinated drinks per day.    REVIEW OF SYSTEMS:    GU Review Male:   Patient denies frequent urination, hard to postpone urination, burning/ pain with urination, get up at night to urinate, leakage of urine, stream starts and stops, trouble starting your stream, have to strain to urinate , erection problems, and penile pain.  Gastrointestinal (Upper):   Patient denies nausea, vomiting, and indigestion/ heartburn.  Gastrointestinal (Lower):   Patient denies diarrhea and constipation.  Constitutional:   Patient denies fever, night sweats, weight loss, and fatigue.  Skin:   Patient denies skin rash/ lesion and itching.  Eyes:   Patient denies blurred vision and double vision.  Ears/ Nose/ Throat:   Patient denies sinus problems and sore throat.  Hematologic/Lymphatic:   Patient denies swollen glands and easy bruising.  Cardiovascular:   Patient denies leg swelling and chest pains.  Respiratory:   Patient denies cough and shortness of breath.  Endocrine:   Patient denies excessive thirst.  Musculoskeletal:   Patient denies back pain and joint pain.  Neurological:   Patient denies headaches and dizziness.  Psychologic:   Patient denies depression and anxiety.   VITAL SIGNS: None   MULTI-SYSTEM PHYSICAL EXAMINATION:    Constitutional: Well-nourished. No physical deformities. Normally developed. Good grooming.  Neck: Neck symmetrical, not swollen. Normal tracheal position.  Respiratory: No labored breathing, no use of accessory muscles.   Skin: No paleness, no jaundice, no cyanosis. No lesion, no ulcer, no rash.  Neurologic / Psychiatric: Oriented to time, oriented to place, oriented to person. No depression, no anxiety, no agitation.  Eyes: Normal conjunctivae. Normal eyelids.  Ears, Nose, Mouth, and Throat: Left ear no scars, no lesions, no masses. Right ear no scars, no lesions, no masses. Nose no scars, no  lesions, no masses. Normal hearing. Normal lips.  Musculoskeletal: Normal gait and station of head and neck.     Complexity of Data:  Records Review:   Previous Patient Records, POC Tool  Urine Test Review:   Urinalysis   PROCEDURES:         Flexible Cystoscopy - 52000  Risks, benefits, and some of the potential complications of the procedure were discussed at length with the patient including infection, bleeding, voiding discomfort, urinary retention, fever, chills, sepsis, and others. All questions were answered. Informed consent was obtained. Sterile technique and intraurethral analgesia were used.  Meatus:  Normal size. Normal location. Normal condition.  Urethra:  No strictures.  External Sphincter:  Normal.  Verumontanum:  Normal.  Prostate:  Obstructing lateral lobes  Bladder Neck:  Non-obstructing.  Ureteral Orifices:  Slightly deviated due to prior surgery but both visible and effluxing clear urine. Appear uninvolved by affected area.   Bladder:  Prior area of concern on left posterior wall still with poorly healing mucosa, erythematous with clot adherent, concerning for recurrence       The lower urinary tract was carefully examined. The procedure was well-tolerated and without complications. Antibiotic instructions were given. Instructions were given to call the office immediately for bloody urine, difficulty urinating, urinary retention, painful or frequent urination, fever, chills, nausea, vomiting or other illness. The patient stated that he understood these instructions and would comply with them.         Urinalysis w/Scope Dipstick Dipstick Cont'd Micro  Color: Yellow Bilirubin: Neg mg/dL WBC/hpf:  0 - 5/hpf  Appearance: Clear Ketones: Neg mg/dL RBC/hpf: 3 - 10/hpf  Specific Gravity: 1.020 Blood: Trace ery/uL Bacteria: Few (10-25/hpf)  pH: 6.5 Protein: Neg mg/dL Cystals: NS (Not Seen)  Glucose: 3+ mg/dL Urobilinogen: 0.2 mg/dL Casts: NS (Not Seen)    Nitrites: Neg  Trichomonas: Not Present    Leukocyte Esterase: Neg leu/uL Mucous: Not Present      Epithelial Cells: 0 - 5/hpf      Yeast: NS (Not Seen)      Sperm: Not Present    ASSESSMENT:      ICD-10 Details  1 GU:   Bladder Cancer overlapping sites - C67.8 Chronic, Stable  2   Gross hematuria - R31.0 Chronic, Stable   PLAN:           Document Letter(s):  Created for Patient: Clinical Summary         Notes:   1. Bladder cancer:  -Cystoscopy today again shows 2 areas of poorly hearing mucosa on the posterior wall at prior TUR site. There is irregular mucosa and clot here concerning for recurrence. Patient continues to use tobacco and alcohol daily. I discussed with him that I am worried at increased risk of recurrence and progression based on the appearance of cystoscopy. I recommended cystoscopy with repeat TURBT. Patient would like to think about this and would like to send urine cytology today first. Will call pt with results but urged him to think about repeat resection.

## 2022-07-15 NOTE — Anesthesia Preprocedure Evaluation (Addendum)
Anesthesia Evaluation  Patient identified by MRN, date of birth, ID band Patient awake    Reviewed: Allergy & Precautions, NPO status , Patient's Chart, lab work & pertinent test results  Airway Mallampati: III  TM Distance: >3 FB Neck ROM: Full    Dental  (+) Edentulous Upper, Edentulous Lower   Pulmonary Current Smoker and Patient abstained from smoking. 25 pack year history, current smoker 1ppd Spiriva last used this AM   Pulmonary exam normal breath sounds clear to auscultation       Cardiovascular hypertension, Pt. on medications pulmonary hypertension (mod pHTN on ehco)+ CAD, + Past MI, + CABG (2011), + Peripheral Vascular Disease and +CHF (LVEF 35-40%, moderate RV failure)  Normal cardiovascular exam+ Valvular Problems/Murmurs (s/p MVR 2011- last echo 2023 mild MR and mod-severe MS) MR  Rhythm:Regular Rate:Normal  Last echo 03/2022:  1. Left ventricular ejection fraction, by estimation, is 35 to 40%. Left  ventricular ejection fraction by 3D volume is 37 %. The left ventricle has  moderately decreased function. The left ventricle demonstrates regional  wall motion abnormalities (see  scoring diagram/findings for description). Left ventricular diastolic  function could not be evaluated. There is severe akinesis of the left  ventricular, basal-mid inferoseptal wall, inferior wall and inferolateral  wall.   2. Right ventricular systolic function is moderately reduced. The right  ventricular size is mildly enlarged. There is moderately elevated  pulmonary artery systolic pressure. The estimated right ventricular  systolic pressure is [id number] mmHg.   3. The mitral valve has been repaired/replaced. Mild mitral valve  regurgitation. Moderate to severe mitral stenosis. The mean mitral valve  gradient is 14.0 mmHg. There is a Mitral valvuloplasty present in the  mitral position. Procedure Date: 03/15/2010.   4. The aortic valve is  tricuspid. Aortic valve regurgitation is trivial.  Aortic valve sclerosis/calcification is present, without any evidence of  aortic stenosis. Aortic regurgitation PHT measures 484 msec.   5. The inferior vena cava is normal in size with greater than 50%  respiratory variability, suggesting right atrial pressure of 3 mmHg.     Neuro/Psych  PSYCHIATRIC DISORDERS  Depression    Carotid US 2023: right ICA 40-59% stenosis Left ICA 40-59% stenosis.   negative neurological ROS     GI/Hepatic ,GERD  Controlled,,(+)       alcohol use14 drinks/wk   Endo/Other  diabetes, Well Controlled, Type 2, Oral Hypoglycemic Agents  A1c 2023 7.8  Renal/GU negative Renal ROS Bladder dysfunction (bladder ca)      Musculoskeletal negative musculoskeletal ROS (+)    Abdominal   Peds  Hematology negative hematology ROS (+)   Anesthesia Other Findings   Reproductive/Obstetrics negative OB ROS                             Anesthesia Physical Anesthesia Plan  ASA: 4  Anesthesia Plan: General   Post-op Pain Management: Tylenol PO (pre-op)*   Induction: Intravenous  PONV Risk Score and Plan: 2 and Ondansetron, Dexamethasone, Treatment may vary due to age or medical condition and Midazolam  Airway Management Planned: Oral ETT  Additional Equipment: None  Intra-op Plan:   Post-operative Plan: Extubation in OR  Informed Consent: I have reviewed the patients History and Physical, chart, labs and discussed the procedure including the risks, benefits and alternatives for the proposed anesthesia with the patient or authorized representative who has indicated his/her understanding and acceptance.     Dental advisory given  Plan Discussed with: CRNA  Anesthesia Plan Comments: (Last TURBT 01/2022 airway: Ventilation: Mask ventilation without difficulty Laryngoscope Size: Mac and 4 Grade View: Grade I Tube type: Oral Tube size: 7.5 mm Number of attempts:  1  See PAT note 07/15/2022)        Anesthesia Quick Evaluation

## 2022-07-16 ENCOUNTER — Encounter (HOSPITAL_COMMUNITY): Payer: Self-pay | Admitting: Urology

## 2022-07-16 ENCOUNTER — Ambulatory Visit (HOSPITAL_COMMUNITY): Payer: Medicare Other | Admitting: Anesthesiology

## 2022-07-16 ENCOUNTER — Ambulatory Visit (HOSPITAL_COMMUNITY)
Admission: RE | Admit: 2022-07-16 | Discharge: 2022-07-16 | Disposition: A | Discharge status: Home / Self Care | Payer: Medicare Other | Source: Ambulatory Visit | Attending: Urology | Admitting: Urology

## 2022-07-16 ENCOUNTER — Ambulatory Visit (HOSPITAL_BASED_OUTPATIENT_CLINIC_OR_DEPARTMENT_OTHER): Payer: Medicare Other | Admitting: Anesthesiology

## 2022-07-16 ENCOUNTER — Encounter (HOSPITAL_COMMUNITY)
Admission: RE | Disposition: A | Discharge status: Home / Self Care | Payer: Self-pay | Source: Ambulatory Visit | Attending: Urology

## 2022-07-16 DIAGNOSIS — I2510 Atherosclerotic heart disease of native coronary artery without angina pectoris: Secondary | ICD-10-CM

## 2022-07-16 DIAGNOSIS — E119 Type 2 diabetes mellitus without complications: Secondary | ICD-10-CM

## 2022-07-16 DIAGNOSIS — F32A Depression, unspecified: Secondary | ICD-10-CM | POA: Diagnosis not present

## 2022-07-16 DIAGNOSIS — Z7984 Long term (current) use of oral hypoglycemic drugs: Secondary | ICD-10-CM | POA: Diagnosis not present

## 2022-07-16 DIAGNOSIS — D303 Benign neoplasm of bladder: Secondary | ICD-10-CM | POA: Insufficient documentation

## 2022-07-16 DIAGNOSIS — I272 Pulmonary hypertension, unspecified: Secondary | ICD-10-CM | POA: Diagnosis not present

## 2022-07-16 DIAGNOSIS — F17200 Nicotine dependence, unspecified, uncomplicated: Secondary | ICD-10-CM | POA: Insufficient documentation

## 2022-07-16 DIAGNOSIS — Z951 Presence of aortocoronary bypass graft: Secondary | ICD-10-CM | POA: Diagnosis not present

## 2022-07-16 DIAGNOSIS — D090 Carcinoma in situ of bladder: Secondary | ICD-10-CM | POA: Diagnosis not present

## 2022-07-16 DIAGNOSIS — I110 Hypertensive heart disease with heart failure: Secondary | ICD-10-CM | POA: Insufficient documentation

## 2022-07-16 DIAGNOSIS — I509 Heart failure, unspecified: Secondary | ICD-10-CM | POA: Diagnosis not present

## 2022-07-16 DIAGNOSIS — F17210 Nicotine dependence, cigarettes, uncomplicated: Secondary | ICD-10-CM

## 2022-07-16 DIAGNOSIS — K219 Gastro-esophageal reflux disease without esophagitis: Secondary | ICD-10-CM | POA: Diagnosis not present

## 2022-07-16 DIAGNOSIS — I252 Old myocardial infarction: Secondary | ICD-10-CM | POA: Diagnosis not present

## 2022-07-16 HISTORY — PX: TRANSURETHRAL RESECTION OF BLADDER TUMOR: SHX2575

## 2022-07-16 LAB — GLUCOSE, CAPILLARY
Glucose-Capillary: 202 mg/dL — ABNORMAL HIGH (ref 70–99)
Glucose-Capillary: 185 mg/dL — ABNORMAL HIGH (ref 70–99)

## 2022-07-16 LAB — HEMOGLOBIN A1C
Hgb A1c MFr Bld: 8 % — ABNORMAL HIGH (ref 4.8–5.6)
Mean Plasma Glucose: 183 mg/dL

## 2022-07-16 SURGERY — TURBT (TRANSURETHRAL RESECTION OF BLADDER TUMOR)
Anesthesia: General

## 2022-07-16 MED ORDER — ORAL CARE MOUTH RINSE: 15.0000 mL | Freq: Once | OROMUCOSAL | Status: AC

## 2022-07-16 MED ORDER — IPRATROPIUM-ALBUTEROL 0.5-2.5 (3) MG/3ML IN SOLN
3.0000 mL | Freq: Once | RESPIRATORY_TRACT | Status: AC
  Administered 2022-07-16: 3 mL via RESPIRATORY_TRACT

## 2022-07-16 MED ORDER — ONDANSETRON HCL 4 MG/2ML IJ SOLN: 4.0000 mg | Freq: Once | INTRAMUSCULAR | Status: DC | PRN

## 2022-07-16 MED ORDER — FENTANYL CITRATE (PF) 100 MCG/2ML IJ SOLN
INTRAMUSCULAR | Status: DC | PRN
  Administered 2022-07-16: 100 ug via INTRAVENOUS

## 2022-07-16 MED ORDER — STERILE WATER FOR IRRIGATION IR SOLN
Status: DC | PRN
  Administered 2022-07-16: 1000 mL

## 2022-07-16 MED ORDER — CEFAZOLIN SODIUM-DEXTROSE 2-4 GM/100ML-% IV SOLN
2.0000 g | INTRAVENOUS | Status: AC
  Administered 2022-07-16: 2 g via INTRAVENOUS
  Filled 2022-07-16: qty 100

## 2022-07-16 MED ORDER — LACTATED RINGERS IV SOLN: INTRAVENOUS | Status: DC

## 2022-07-16 MED ORDER — CHLORHEXIDINE GLUCONATE 0.12 % MT SOLN
15.0000 mL | Freq: Once | OROMUCOSAL | Status: AC
  Administered 2022-07-16: 15 mL via OROMUCOSAL

## 2022-07-16 MED ORDER — MIDAZOLAM HCL 2 MG/2ML IJ SOLN
INTRAMUSCULAR | Status: AC
  Filled 2022-07-16: qty 2

## 2022-07-16 MED ORDER — AMISULPRIDE (ANTIEMETIC) 5 MG/2ML IV SOLN: 10.0000 mg | Freq: Once | INTRAVENOUS | Status: DC | PRN

## 2022-07-16 MED ORDER — IPRATROPIUM-ALBUTEROL 0.5-2.5 (3) MG/3ML IN SOLN
RESPIRATORY_TRACT | Status: AC
  Filled 2022-07-16: qty 3

## 2022-07-16 MED ORDER — OXYCODONE HCL 5 MG/5ML PO SOLN: 5.0000 mg | Freq: Once | ORAL | Status: AC | PRN

## 2022-07-16 MED ORDER — ACETAMINOPHEN 500 MG PO TABS
1000.0000 mg | ORAL_TABLET | Freq: Once | ORAL | Status: AC
  Administered 2022-07-16: 1000 mg via ORAL
  Filled 2022-07-16: qty 2

## 2022-07-16 MED ORDER — FENTANYL CITRATE (PF) 100 MCG/2ML IJ SOLN
INTRAMUSCULAR | Status: AC
  Filled 2022-07-16: qty 2

## 2022-07-16 MED ORDER — LIDOCAINE 2% (20 MG/ML) 5 ML SYRINGE
INTRAMUSCULAR | Status: DC | PRN
  Administered 2022-07-16: 60 mg via INTRAVENOUS

## 2022-07-16 MED ORDER — OXYCODONE HCL 5 MG PO TABS
ORAL_TABLET | ORAL | Status: AC
  Filled 2022-07-16: qty 1

## 2022-07-16 MED ORDER — HYDROMORPHONE HCL 1 MG/ML IJ SOLN: 0.2500 mg | INTRAMUSCULAR | Status: DC | PRN

## 2022-07-16 MED ORDER — MIDAZOLAM HCL 5 MG/5ML IJ SOLN
INTRAMUSCULAR | Status: DC | PRN
  Administered 2022-07-16: 2 mg via INTRAVENOUS

## 2022-07-16 MED ORDER — CEPHALEXIN 500 MG PO CAPS: 500.0000 mg | ORAL_CAPSULE | Freq: Every day | ORAL | 0 refills | Status: AC

## 2022-07-16 MED ORDER — PROPOFOL 10 MG/ML IV BOLUS
INTRAVENOUS | Status: DC | PRN
  Administered 2022-07-16: 150 mg via INTRAVENOUS

## 2022-07-16 MED ORDER — PROPOFOL 10 MG/ML IV BOLUS
INTRAVENOUS | Status: AC
  Filled 2022-07-16: qty 20

## 2022-07-16 MED ORDER — ROCURONIUM BROMIDE 10 MG/ML (PF) SYRINGE
PREFILLED_SYRINGE | INTRAVENOUS | Status: DC | PRN
  Administered 2022-07-16: 80 mg via INTRAVENOUS

## 2022-07-16 MED ORDER — SUGAMMADEX SODIUM 200 MG/2ML IV SOLN
INTRAVENOUS | Status: DC | PRN
  Administered 2022-07-16 (×2): 200 mg via INTRAVENOUS

## 2022-07-16 MED ORDER — PHENYLEPHRINE 80 MCG/ML (10ML) SYRINGE FOR IV PUSH (FOR BLOOD PRESSURE SUPPORT)
PREFILLED_SYRINGE | INTRAVENOUS | Status: DC | PRN
  Administered 2022-07-16 (×3): 160 ug via INTRAVENOUS

## 2022-07-16 MED ORDER — SODIUM CHLORIDE 0.9 % IR SOLN
Status: DC | PRN
  Administered 2022-07-16: 3000 mL via INTRAVESICAL

## 2022-07-16 MED ORDER — OXYCODONE HCL 5 MG PO TABS
5.0000 mg | ORAL_TABLET | Freq: Once | ORAL | Status: AC | PRN
  Administered 2022-07-16: 5 mg via ORAL

## 2022-07-16 SURGICAL SUPPLY — 19 items
BAG DRN RND TRDRP ANRFLXCHMBR (UROLOGICAL SUPPLIES)
BAG URINE DRAIN 2000ML AR STRL (UROLOGICAL SUPPLIES) IMPLANT
BAG URO CATCHER STRL LF (MISCELLANEOUS) ×2 IMPLANT
CATH FOLEY 2WAY SLVR  5CC 18FR (CATHETERS) ×1
CATH FOLEY 2WAY SLVR 5CC 18FR (CATHETERS) IMPLANT
CLOTH BEACON ORANGE TIMEOUT ST (SAFETY) ×2 IMPLANT
DRAPE FOOT SWITCH (DRAPES) ×2 IMPLANT
ELECT REM PT RETURN 15FT ADLT (MISCELLANEOUS) IMPLANT
GLOVE BIO SURGEON STRL SZ 6.5 (GLOVE) ×2 IMPLANT
GOWN STRL REUS W/ TWL LRG LVL3 (GOWN DISPOSABLE) ×2 IMPLANT
GOWN STRL REUS W/TWL LRG LVL3 (GOWN DISPOSABLE) ×1
KIT TURNOVER KIT A (KITS) IMPLANT
LOOP CUT BIPOLAR 24F LRG (ELECTROSURGICAL) IMPLANT
MANIFOLD NEPTUNE II (INSTRUMENTS) ×2 IMPLANT
PACK CYSTO (CUSTOM PROCEDURE TRAY) ×2 IMPLANT
SYR TOOMEY IRRIG 70ML (MISCELLANEOUS)
SYRINGE TOOMEY IRRIG 70ML (MISCELLANEOUS) IMPLANT
TUBING CONNECTING 10 (TUBING) ×2 IMPLANT
TUBING UROLOGY SET (TUBING) ×2 IMPLANT

## 2022-07-16 NOTE — Anesthesia Postprocedure Evaluation (Signed)
Anesthesia Post Note  Patient: Eric Bradshaw  Procedure(s) Performed: TRANSURETHRAL RESECTION OF BLADDER TUMOR (TURBT)     Patient location during evaluation: PACU Anesthesia Type: General Level of consciousness: awake and alert, oriented and patient cooperative Pain management: pain level controlled Vital Signs Assessment: post-procedure vital signs reviewed and stable Respiratory status: spontaneous breathing, nonlabored ventilation and respiratory function stable Cardiovascular status: blood pressure returned to baseline and stable Postop Assessment: no apparent nausea or vomiting Anesthetic complications: no   No notable events documented.  Last Vitals:  Vitals:   07/16/22 0915 07/16/22 0930  BP: 139/73 (!) 143/73  Pulse: 84 88  Resp: 15   Temp:  36.5 C  SpO2: (!) 89% 93%    Last Pain:  Vitals:   07/16/22 0930  TempSrc:   PainSc: [place]

## 2022-07-16 NOTE — Discharge Instructions (Addendum)
Transurethral Resection of Bladder Tumor (TURBT)   Definition:  Transurethral Resection of the Bladder Tumor is a surgical procedure used to diagnose and remove tumors within the bladder. TURBT is the most common treatment for early stage bladder cancer.  General instructions:     Your recent bladder surgery requires very little post hospital care but some definite precautions.  Despite the fact that no skin incisions were used, the area around the bladder incisions are raw and covered with scabs to promote healing and prevent bleeding. Certain precautions are needed to insure that the scabs are not disturbed over the next 2-4 weeks while the healing proceeds.  Because the raw surface inside your bladder and the irritating effects of urine you may expect frequency of urination and/or urgency (a stronger desire to urinate) and perhaps even getting up at night more often. This will usually resolve or improve slowly over the healing period. You may see some blood in your urine over the first 6 weeks. Do not be alarmed, even if the urine was clear for a while. Get off your feet and drink lots of fluids until clearing occurs. If you start to pass clots or don't improve call us.  Catheter: (If you are discharged with a catheter.)  1. Keep your catheter secured to your leg at all times with tape or the supplied strap. 2. You may experience leakage of urine around your catheter- as long as the  catheter continues to drain, this is normal.  If your catheter stops draining  go to the ER. 3. You may also have blood in your urine, even after it has been clear for  several days; you may even pass some small blood clots or other material.  This  is normal as well.  If this happens, sit down and drink plenty of water to help  make urine to flush out your bladder.  If the blood in your urine becomes worse  after doing this, contact our office or return to the ER. 4. You may use the leg bag (small bag)  during the day, but use the large bag at  night.  Diet:  You may return to your normal diet immediately. Because of the raw surface of your bladder, alcohol, spicy foods, foods high in acid and drinks with caffeine may cause irritation or frequency and should be used in moderation. To keep your urine flowing freely and avoid constipation, drink plenty of fluids during the day (8-10 glasses). Tip: Avoid cranberry juice because it is very acidic.  Activity:  Your physical activity doesn't need to be restricted. However, if you are very active, you may see some blood in the urine. We suggest that you reduce your activity under the circumstances until the bleeding has stopped.  Bowels:  It is important to keep your bowels regular during the postoperative period. Straining with bowel movements can cause bleeding. A bowel movement every other day is reasonable. Use a mild laxative if needed, such as milk of magnesia 2-3 tablespoons, or 2 Dulcolax tablets. Call if you continue to have problems. If you had been taking narcotics for pain, before, during or after your surgery, you may be constipated. Take a laxative if necessary.    Medication:   You should resume your pre-surgery medications unless told not to. In addition you may be given an antibiotic to prevent or treat infection. Antibiotics are not always necessary. All medication should be taken as prescribed until the bottles are finished unless you are  having an unusual reaction to one of the drugs.   Do not restart plavix until urine remains clear for 24 hours (likely on Thursday 4/4). Please take cephalexin 500mg  daily to help with infection prevention.

## 2022-07-16 NOTE — Transfer of Care (Signed)
Immediate Anesthesia Transfer of Care Note  Patient: Eric Bradshaw  Procedure(s) Performed: TRANSURETHRAL RESECTION OF BLADDER TUMOR (TURBT)  Patient Location: PACU  Anesthesia Type:General  Level of Consciousness: awake, alert , and oriented  Airway & Oxygen Therapy: Patient Spontanous Breathing and Patient connected to face mask oxygen  Post-op Assessment: Report given to RN and Post -op Vital signs reviewed and stable  Post vital signs: Reviewed and stable  Last Vitals:  Vitals Value Taken Time  BP 161/141 07/16/22 0826  Temp    Pulse 99 07/16/22 0827  Resp 19 07/16/22 0827  SpO2 91 % 07/16/22 0827  Vitals shown include unvalidated device data.  Last Pain:  Vitals:   07/16/22 0657  TempSrc:   PainSc: 0-No pain      Patients Stated Pain Goal: 3 ([id number])  Complications: No notable events documented.

## 2022-07-16 NOTE — Interval H&P Note (Signed)
History and Physical Interval Note:  07/16/2022 7:28 AM  Eric Bradshaw  has presented today for surgery, with the diagnosis of BLADDER CANCER.  The various methods of treatment have been discussed with the patient and family. After consideration of risks, benefits and other options for treatment, the patient has consented to  Procedure(s) with comments: TRANSURETHRAL RESECTION OF BLADDER TUMOR (TURBT) (N/A) - 60 MINUTES as a surgical intervention.  The patient's history has been reviewed, patient examined, no change in status, stable for surgery.  I have reviewed the patient's chart and labs.  Questions were answered to the patient's satisfaction.     Doug Bucklin D Jonika Critz

## 2022-07-16 NOTE — Anesthesia Procedure Notes (Signed)
Procedure Name: Intubation Date/Time: 07/16/2022 7:47 AM  Performed by: Gean Maidens, CRNAPre-anesthesia Checklist: Emergency Drugs available, Patient identified, Suction available, Patient being monitored and Timeout performed Patient Re-evaluated:Patient Re-evaluated prior to induction Oxygen Delivery Method: Circle system utilized Preoxygenation: Pre-oxygenation with 100% oxygen Induction Type: IV induction Ventilation: Mask ventilation without difficulty Laryngoscope Size: Mac and 4 Grade View: Grade I Tube type: Oral Tube size: 7.5 mm Number of attempts: 1 Airway Equipment and Method: Stylet Placement Confirmation: ETT inserted through vocal cords under direct vision, positive ETCO2 and breath sounds checked- equal and bilateral Secured at: 23 cm Tube secured with: Tape Dental Injury: Teeth and Oropharynx as per pre-operative assessment

## 2022-07-16 NOTE — Op Note (Signed)
PATIENT:  Eric Bradshaw  PRE-OPERATIVE DIAGNOSIS: CIS of the bladder  POST-OPERATIVE DIAGNOSIS: Same  PROCEDURE:  Procedure(s): 1. TRANSURETHRAL RESECTION OF BLADDER TUMOR (TURBT) (2cm.)   SURGEON:  Jacalyn Lefevre, MD  ANESTHESIA:   General  EBL:  Minimal  DRAINS: Urethral catheter (18 Fr. Foley)   SPECIMEN:  Bladder tumor  DISPOSITION OF SPECIMEN:  PATHOLOGY  FINDINGS: Normal anterior urethra Lateral lobe prostatic hyperplasia without intravesical median lobe Bilateral Uos seen at start and end of case effluxing clear urine; displaced superiorly and medially Prior TUR area and area of concern on posterior wall with oozing mucosa  Indication: 67 year old man with a history of CIS of the bladder and most recent surveillance cystoscopy showing friable irregular bladder mucosa concerning for recurrence on the posterior wall.  Description of operation: The patient was taken to the operating room and administered general anesthesia. They were then placed on the table and moved to the dorsal lithotomy position after which the genitalia was sterilely prepped and draped. An official timeout was then performed.  The 70 French resectoscope with the 30 lens and visual obturator were then passed into the bladder under direct visualization. Urethra appeared normal. The visual obturator was then removed and the Gyrus resectoscope element with 30  lens was then inserted and the bladder was fully and systematically inspected. Ureteral orifices were noted to be in the normal anatomic positions.   The bipolar loop was used to resect tissue from the area of concern.  The bladder wall was not very compliant.  Another area was sampled with cold cup bladder biopsy x 2.  The entire area of concern was then fulgurated with bipolar electrocautery.  Reinspection of the bladder revealed all obvious tumor had been fully resected and there was no evidence of perforation however muscle fibers were noted to  be thin when the bladder was filled.  Hemostasis was adequate with irrigant turned off.  The decision was made to leave the Foley catheter.  The resectoscope was removed and an 13 French Foley catheter was inserted into the bladder.  It was inflated with 10 cc sterile water.  Irrigant returned clear.  The patient emerged from anesthesia and transferred the PACU in stable condition.  PLAN OF CARE: Discharge to home after PACU  PATIENT DISPOSITION:  PACU - hemodynamically stable.

## 2022-07-17 ENCOUNTER — Encounter (HOSPITAL_COMMUNITY): Payer: Self-pay | Admitting: Urology

## 2022-07-17 LAB — SURGICAL PATHOLOGY

## 2022-10-31 ENCOUNTER — Other Ambulatory Visit: Payer: Self-pay | Admitting: Cardiology

## 2023-02-10 ENCOUNTER — Other Ambulatory Visit: Payer: Self-pay | Admitting: Cardiology

## 2023-08-29 ENCOUNTER — Encounter: Payer: Self-pay | Admitting: Cardiology

## 2023-08-29 ENCOUNTER — Ambulatory Visit: Payer: Medicare Other | Attending: Cardiology | Admitting: Cardiology

## 2023-08-29 VITALS — BP 130/76 | HR 79 | Ht 68.0 in | Wt 163.2 lb

## 2023-08-29 DIAGNOSIS — Z9861 Coronary angioplasty status: Secondary | ICD-10-CM | POA: Diagnosis present

## 2023-08-29 DIAGNOSIS — E1169 Type 2 diabetes mellitus with other specified complication: Secondary | ICD-10-CM

## 2023-08-29 DIAGNOSIS — Z9889 Other specified postprocedural states: Secondary | ICD-10-CM | POA: Diagnosis present

## 2023-08-29 DIAGNOSIS — F17200 Nicotine dependence, unspecified, uncomplicated: Secondary | ICD-10-CM

## 2023-08-29 DIAGNOSIS — I214 Non-ST elevation (NSTEMI) myocardial infarction: Secondary | ICD-10-CM

## 2023-08-29 DIAGNOSIS — I255 Ischemic cardiomyopathy: Secondary | ICD-10-CM

## 2023-08-29 DIAGNOSIS — F1090 Alcohol use, unspecified, uncomplicated: Secondary | ICD-10-CM

## 2023-08-29 DIAGNOSIS — E785 Hyperlipidemia, unspecified: Secondary | ICD-10-CM

## 2023-08-29 DIAGNOSIS — I2510 Atherosclerotic heart disease of native coronary artery without angina pectoris: Secondary | ICD-10-CM

## 2023-08-29 DIAGNOSIS — Z951 Presence of aortocoronary bypass graft: Secondary | ICD-10-CM | POA: Diagnosis present

## 2023-08-29 DIAGNOSIS — I10 Essential (primary) hypertension: Secondary | ICD-10-CM | POA: Diagnosis present

## 2023-08-29 DIAGNOSIS — I050 Rheumatic mitral stenosis: Secondary | ICD-10-CM | POA: Diagnosis present

## 2023-08-29 NOTE — Patient Instructions (Addendum)
 Medication Instructions:   No changes  *If you need a refill on your cardiac medications before your next appointment, please call your pharmacy*   Lab Work:please do at Endocrinology LIPID Liver function  Panel  If you have labs (blood work) drawn today and your tests are completely normal, you will receive your results only by: MyChart Message (if you have MyChart) OR A paper copy in the mail If you have any lab test that is abnormal or we need to change your treatment, we will call you to review the results.   Testing/Procedures: Schedule in July  2025 Your physician has requested that you have an echocardiogram. Echocardiography is a painless test that uses sound waves to create images of your heart. It provides your doctor with information about the size and shape of your heart and how well your heart's chambers and valves are working. This procedure takes approximately one hour. There are no restrictions for this procedure. Please do NOT wear cologne, perfume, aftershave, or lotions (deodorant is allowed). Please arrive 15 minutes prior to your appointment time.  Please note: We ask at that you not bring children with you during ultrasound (echo/ vascular) testing. Due to room size and safety concerns, children are not allowed in the ultrasound rooms during exams. Our front office staff cannot provide observation of children in our lobby area while testing is being conducted. An adult accompanying a patient to their appointment will only be allowed in the ultrasound room at the discretion of the ultrasound technician under special circumstances. We apologize for any inconvenience.   Follow-Up: At [facility], you and your health needs are our priority.  As part of our continuing mission to provide you with exceptional heart care, we have created designated Provider Care Teams.  These Care Teams include your primary Cardiologist (physician) and Advanced Practice Providers (APPs -   Physician Assistants and Nurse Practitioners) who all work together to provide you with the care you need, when you need it.     Your next appointment:   12 month(s)  The format for your next appointment:   In Person  Provider:   Randene Bustard, MD

## 2023-08-29 NOTE — Progress Notes (Signed)
 Cardiology Office Note:  .   Date:  08/30/2023  ID:  Eric Bradshaw, DOB 05-08-1955, MRN [id number] PCP: Hampton Levins, PA-C  [facility] Providers Cardiologist:  Randene Bustard, MD     No chief complaint on file.   Patient Profile: .     Eric Bradshaw is a 68 y.o. male  with a PMH notable for multivessel CAD-CABG with ICM (presented with non-STEMI/CHF documented with MV CAD referred for CABG, has had reduced EF<40%, despite PCI to LAD-diag) who presents here for delayed annual follow-up with. Returns today at the request of Francois Isaacs*.  CARDIAC HISTORY:  Dec 2011: NSTEMI w/ Acute HFpEF. => Cath with Severe MV CAD, EF 20% => CABGx3/MVR (LIMA-mLAD, SVG-OM, SVG-rPDA) => 07/2011: LAD-D1 PCI -> EF improved to 35-40% (not > 40% required for CDL). Echo 08/2013: EF 35-40% with moderately reduced function. Moderately dilated LV with distal septal-apical, mid and basal inferior akinesis; status post mitral valve ring. Significant diastolic gradient with mild MR. Moderate LA dilation -> not interested in ICD. MV CAD-CABG& PCI with Chronic HFrEF  = Compensated - NYHA Class I CHF & CCS Angina 0 Hillman has done well with no CHF or angina Sx despite reduced EF.: Not on Entresto because of financial concerns.  Converted back to valsartan  80 mg along with his carvedilol  (6.25 mg q.am & 12.5 mg q. p.m) HTN, HLD, DM-2 Recurrent smoker-likely COPD Moderate alcohol use    Eric Bradshaw was last seen on March 07, 2022 for routine follow-up in his usual nonchalant mood.  No cardiac issues.  Just maybe little more tired than usual.  Having having issues with hematuria and ended up with her bladder tumor resection in October 2023.  Ongoing bladder washes chemo.  Other than some fatigue related to cancer treatments he was doing well no issues.  No heart failure symptoms no angina.  S4 gallop heard on exam along with potential carotid bruit => Follow-up carotid Dopplers and Echo  as well as labs (lipids and chemistry) ordered.  Subjective  Discussed the use of AI scribe software for clinical note transcription with the patient, who gave verbal consent to proceed.  History of Present Illness  Eric Bradshaw returns for routine follow-up noting no recent chest pain, pressure, or significant shortness of breath. He sleeps on two pillows and has no leg swelling, palpitations, or orthopnea.  He has stage one bladder cancer and undergoes chemotherapy every six months, receiving three rounds of bladder flushes with BCG. He finds the process uncomfortable and is tired of frequent doctor visits.  He is on Plavix , carvedilol  (12.5 mg, half in the morning and one in the evening), Jardiance  (25 mg), valsartan  (80 mg), and rosuvastatin  (40 mg).  He feels tired easily but attributes it to his age. He remains active, working part-time at [employer], which involves a lot of walking. No recent illness, fever, chills, or cold sweats.  He has a family history of heart problems and is concerned about his 31 year old son, encouraging him to get his heart checked. He recently became a grandfather again, with a new grandson born two weeks ago.   He continues to smoke and frequently coughs. He consumes alcohol regularly, drinking about five beers and a half shot of bourbon with each beer on his days off, and at least two beers and a shot of bourbon nightly.    Objective   Current Cardiac/Diabetes and COPD Meds  Medication Sig   carvedilol  (COREG )  12.5 MG tablet TAKE 1/2 TABLET BY MOUTH EVERY MORNING AND 1 TABLET AT NIGHT   clopidogrel  (PLAVIX ) 75 MG tablet Take 75 mg by mouth daily.   glimepiride  (AMARYL ) 4 MG tablet Take 4 mg by mouth 2 (two) times daily.   JARDIANCE  25 MG TABS tablet Take 25 mg by mouth in the morning.   Magnesium  500 MG TABS Take 500 mg by mouth in the morning.   Omega-3 Fatty Acids (FISH OIL) 1000 MG CAPS Take 1,000 mg by mouth in the morning.   RYBELSUS 7 MG  TABS Take 7 mg by mouth daily.   SPIRIVA HANDIHALER 18 MCG inhalation capsule Place 18 mcg into inhaler and inhale daily.   valsartan  (DIOVAN ) 80 MG tablet TAKE 1 TABLET(80 MG) BY MOUTH DAILY    Studies Reviewed: Aaron Aas   EKG Interpretation Date/Time:  Friday Aug 29 2023 11:55:16 EDT Ventricular Rate:  79 PR Interval:  174 QRS Duration:  116 QT Interval:  378 QTC Calculation: 433 R Axis:   -26  Text Interpretation: Normal sinus rhythm Incomplete right bundle branch block Cannot rule out Anterior infarct (cited on or before 29-Aug-2023) ST & T wave abnormality, consider lateral ischemia When compared with ECG of 10-Aug-2021 14:53, No significant change was found Confirmed by Randene Bustard ([id number]) on 08/29/2023 12:52:35 PM    ECHO (04/12/2022): Stable LVEF 35 to 40%.  Moderately reduced function with severe akinesis of the basal-mid inferoseptal, inferolateral and inferior wall (consistent with prior RCA infarct).  Unable to determine diastolic parameters due to mitral valve repair.  Repair of mitral valve has mild MR with moderate to severe MS (peak gradient 34.5 mm peak with a mean gradient of 14 mL mercury and heart rate of 62 bpm.  Mildly enlarged RV with moderately reduced function.  Moderately elevated PAP~50 mmHg with a estimated RAP 3 mmHg.  Carotid Dopplers 03/16/2022: Bilateral ICA 40 to 59%,<50% R CCA.  Bilateral vertebral arteries with normal flow.  Bilateral subclavian arteries had to turn flow.  Last A1c was from August 2024 -7.5 Lab Results  Component Value Date   CHOL 121 03/01/2022   HDL 43 03/01/2022   LDLCALC 55 03/01/2022   TRIG 131 03/01/2022   CHOLHDL 2.8 03/01/2022   Lab Results  Component Value Date   NA 134 (L) 07/15/2022   K 4.4 07/15/2022   CREATININE 0.70 07/15/2022   GFRNONAA >60 07/15/2022   GLUCOSE 251 (H) 07/15/2022   Risk Assessment/Calculations:          Physical Exam:   VS:  BP 130/76 (BP Location: Left Arm, Patient Position: Sitting, Cuff Size:  Normal)   Pulse 79   Ht 5\' 8"  (1.727 m)   Wt 163 lb 3.2 oz (74 kg)   SpO2 97%   BMI 24.81 kg/m    Wt Readings from Last 3 Encounters:  08/29/23 163 lb 3.2 oz (74 kg)  07/16/22 160 lb (72.6 kg)  07/15/22 160 lb (72.6 kg)    GEN: Well nourished, well groomed in no acute distress; healthy-appearing.  Typical nonchalant attitude. NECK: No JVD; soft right-sided carotid bruit CARDIAC:  RRR with occasional ectopy; Distant heart sounds but mostly normal S1 and S2 with S4 gallop; 1/6 HSM at apex.  Unable to assess DM. RESPIRATORY:  Clear to auscultation without rales, wheezing or rhonchi ; nonlabored, good air movement. ABDOMEN: Soft, non-tender, non-distended EXTREMITIES:  No edema; No deformity ; mild kyphosis     ASSESSMENT AND PLAN: .    Problem List  Items Addressed This Visit       Cardiology Problems   CAD - s/p CABG x 17 Mar 2010. Abnormal Myoview -LAD & D1 disease 06/2011 Rx'd with Promus DES (Chronic)   Multivessel CAD with CABG x 3 but unprotective diagonal was treated with LAD diagonal PCI and an improvement of EF.  Has not had any further anginal symptoms since PCI. -Remains on maintenance dose Plavix  5 mg monotherapy based on extensive stent GDMT: - On stable dose of carvedilol  and valsartan  and along with 40 mg rosuvastatin . - On both Rybelsus (GLP-1 agonist) and Jardiance  (SGLT2 inhibitor)  Shared decision making: Patient is opted to forego surveillance stress testing due to avoid cost.      Relevant Orders   ECHOCARDIOGRAM COMPLETE   Lipid panel   Essential hypertension - Primary (Chronic)   Stable BP on valsartan  80 mg daily along with carvedilol  6.25 mg every morning and 12.5 mg every afternoon.      Relevant Orders   EKG 12-Lead (Completed)   Hyperlipidemia associated with type 2 diabetes mellitus (HCC) (Chronic)   Managed with rosuvastatin  40 mg. Cholesterol levels well-controlled by last check, but has been sometime since last check. - Ensure blood work  includes cholesterol and liver function tests. -For now continue current dose of rosuvastatin  40 mg daily and fish oil caplets.  Type 2 diabetes mellitus-last A1c 7.5 as of August 2024. Managed with Jardiance  25 mg, Rybelsus 7 mg daily and glimepiride  4 mg twice daily He is due for upcoming blood work by PCP/endocrinology to assess status. - Ensure blood work includes cholesterol and liver function tests.      Relevant Orders   Hepatic function panel   Ischemic Cardiomyopathy - EF ~35 -40%; Class I-II CHF (Chronic)   Stable EF 35 to 40% on echo back in 2023.  Will recheck echo this year to reassess mitral valve after mitral repair. Entresto cost was prohibitive for him and therefore he remains on valsartan  and 81 mg daily along with carvedilol  but only able to tolerate 625 mg in the evening and 2 point 5 at night.  Is on Jardiance  at diabetes doses along with Rybelsus.  Has been euvolemic for years and does not require diuretic. No recent exacerbations or symptoms. => NYHA Class Ib-IIa (due to some DOE) Last echocardiogram showed stable pump function and elevated right ventricular pressure. - On Carvedilol  (6.25 mg AM- 12.5 mg PM), Valsartan  80 mg & Jardiance  25 mg daily -> Entresto was cost prohibitive  - No diuretic requirement - Order echocardiogram by December 2025 to assess mitral valve (Mitral Stenosis) and cardiac function.      Relevant Orders   ECHOCARDIOGRAM COMPLETE   Mitral stenosis, supravalvar mitral ring (Chronic)   Moderate to severe stenosis following MVR, stable with no symptoms. Monitoring necessary due to potential rapid progression. - Order echocardiogram by December 2025 to assess mitral valve.      NSTEMI (non-ST elevated myocardial infarction) (HCC) (Chronic)   13-1/2 years out from his MI/CHF presentation 10 years out from LAD -diagonal PCI with improved EF.  Has not had any heart failure or angina symptoms since. EF did improve up to 35-40% and has remained  stable.        Other   Alcohol use disorder (Chronic)   Regular alcohol consumption (2-4 beers most nights & up to 6 on weekends, in addition to 1-2 "shots of whiskey")   Advised moderation due to hepatic impact and interaction with smoking. - Advise on  reducing alcohol intake, particularly bourbon.      S/P CABG x 3 (Chronic)   Relevant Orders   ECHOCARDIOGRAM COMPLETE   Hepatic function panel   Lipid panel   S/P MVR (mitral valve repair) (Chronic)   He had MVR, CABG for ischemic MR, recent echo showed potential stenosis of the repaired valve. - Recheck 2D echo      Relevant Orders   ECHOCARDIOGRAM COMPLETE   Tobacco dependence (Chronic)   Continues smoking with increased coughing. Advised cessation to reduce cardiovascular risks. - Advise on smoking cessation.-4 minutes spent discussing options on how to curb/stop smoking.          Follow-Up: Return in about 1 year (around 08/28/2024) for Routine follow up with me, [employer].  I spent 47 minutes in the care of MATAS BURROWS today including reviewing labs (from both epic and Care Everywhere/[facility]-1 minute), reviewing outside labs from [facility] (included in 1 minute already noted), reviewing studies (echo compared to previous echoes-5 minutes, images reviewed, 5 minutes-total 10 minutes), face to face time discussing treatment options (23 minutes), reviewing records from previous notes (2 minutes), 11 minutes dictating, and documenting in the encounter.     Signed, Arleen Lacer, MD, MS Randene Bustard, M.D., M.S. Interventional Chartered certified accountant  Pager # [phone]

## 2023-08-30 DIAGNOSIS — I050 Rheumatic mitral stenosis: Secondary | ICD-10-CM | POA: Insufficient documentation

## 2023-08-30 DIAGNOSIS — F1090 Alcohol use, unspecified, uncomplicated: Secondary | ICD-10-CM | POA: Insufficient documentation

## 2023-08-30 NOTE — Assessment & Plan Note (Signed)
 13-1/2 years out from his MI/CHF presentation 10 years out from LAD -diagonal PCI with improved EF.  Has not had any heart failure or angina symptoms since. EF did improve up to 35-40% and has remained stable.

## 2023-08-30 NOTE — Assessment & Plan Note (Signed)
 He had MVR, CABG for ischemic MR, recent echo showed potential stenosis of the repaired valve. - Recheck 2D echo

## 2023-08-30 NOTE — Assessment & Plan Note (Addendum)
 Managed with rosuvastatin  40 mg. Cholesterol levels well-controlled by last check, but has been sometime since last check. - Ensure blood work includes cholesterol and liver function tests. -For now continue current dose of rosuvastatin  40 mg daily and fish oil caplets.  Type 2 diabetes mellitus-last A1c 7.5 as of August 2024. Managed with Jardiance  25 mg, Rybelsus 7 mg daily and glimepiride  4 mg twice daily He is due for upcoming blood work by PCP/endocrinology to assess status. - Ensure blood work includes cholesterol and liver function tests.

## 2023-08-30 NOTE — Assessment & Plan Note (Addendum)
 Stable EF 35 to 40% on echo back in 2023.  Will recheck echo this year to reassess mitral valve after mitral repair. Entresto cost was prohibitive for him and therefore he remains on valsartan  and 81 mg daily along with carvedilol  but only able to tolerate 625 mg in the evening and 2 point 5 at night.  Is on Jardiance  at diabetes doses along with Rybelsus.  Has been euvolemic for years and does not require diuretic. No recent exacerbations or symptoms. => NYHA Class Ib-IIa (due to some DOE) Last echocardiogram showed stable pump function and elevated right ventricular pressure. - On Carvedilol  (6.25 mg AM- 12.5 mg PM), Valsartan  80 mg & Jardiance  25 mg daily -> Entresto was cost prohibitive  - No diuretic requirement - Order echocardiogram by December 2025 to assess mitral valve (Mitral Stenosis) and cardiac function.

## 2023-08-30 NOTE — Assessment & Plan Note (Signed)
 Stable BP on valsartan  80 mg daily along with carvedilol  6.25 mg every morning and 12.5 mg every afternoon.

## 2023-08-30 NOTE — Assessment & Plan Note (Signed)
 Regular alcohol consumption (2-4 beers most nights & up to 6 on weekends, in addition to 1-2 "shots of whiskey")   Advised moderation due to hepatic impact and interaction with smoking. - Advise on reducing alcohol intake, particularly bourbon.

## 2023-08-30 NOTE — Assessment & Plan Note (Signed)
 Multivessel CAD with CABG x 3 but unprotective diagonal was treated with LAD diagonal PCI and an improvement of EF.  Has not had any further anginal symptoms since PCI. -Remains on maintenance dose Plavix  5 mg monotherapy based on extensive stent GDMT: - On stable dose of carvedilol  and valsartan  and along with 40 mg rosuvastatin . - On both Rybelsus (GLP-1 agonist) and Jardiance  (SGLT2 inhibitor)  Shared decision making: Patient is opted to forego surveillance stress testing due to avoid cost.

## 2023-08-30 NOTE — Assessment & Plan Note (Signed)
 Continues smoking with increased coughing. Advised cessation to reduce cardiovascular risks. - Advise on smoking cessation.-4 minutes spent discussing options on how to curb/stop smoking.

## 2023-08-30 NOTE — Assessment & Plan Note (Signed)
 Moderate to severe stenosis following MVR, stable with no symptoms. Monitoring necessary due to potential rapid progression. - Order echocardiogram by December 2025 to assess mitral valve.

## 2023-10-06 ENCOUNTER — Other Ambulatory Visit: Payer: Self-pay | Admitting: Cardiology

## 2023-10-31 ENCOUNTER — Ambulatory Visit (HOSPITAL_COMMUNITY)
Admission: RE | Admit: 2023-10-31 | Discharge: 2023-10-31 | Disposition: A | Discharge status: Home / Self Care | Source: Ambulatory Visit | Attending: Cardiology | Admitting: Cardiology

## 2023-10-31 DIAGNOSIS — I2510 Atherosclerotic heart disease of native coronary artery without angina pectoris: Secondary | ICD-10-CM | POA: Diagnosis present

## 2023-10-31 DIAGNOSIS — Z9861 Coronary angioplasty status: Secondary | ICD-10-CM | POA: Insufficient documentation

## 2023-10-31 DIAGNOSIS — Z9889 Other specified postprocedural states: Secondary | ICD-10-CM | POA: Diagnosis present

## 2023-10-31 DIAGNOSIS — I255 Ischemic cardiomyopathy: Secondary | ICD-10-CM | POA: Insufficient documentation

## 2023-10-31 DIAGNOSIS — Z951 Presence of aortocoronary bypass graft: Secondary | ICD-10-CM | POA: Diagnosis present

## 2023-10-31 LAB — ECHOCARDIOGRAM COMPLETE
Area-P 1/2: 4.6 cm2
MV VTI: 1.22 cm2
S' Lateral: 4.09 cm

## 2023-11-02 ENCOUNTER — Ambulatory Visit: Payer: Self-pay | Admitting: Cardiology

## 2024-03-31 ENCOUNTER — Ambulatory Visit: Admitting: Physician Assistant

## 2024-03-31 ENCOUNTER — Other Ambulatory Visit: Payer: Self-pay | Admitting: Urology

## 2024-03-31 ENCOUNTER — Telehealth: Payer: Self-pay | Admitting: Cardiology

## 2024-03-31 ENCOUNTER — Telehealth: Payer: Self-pay

## 2024-03-31 DIAGNOSIS — Z01810 Encounter for preprocedural cardiovascular examination: Secondary | ICD-10-CM

## 2024-03-31 NOTE — Telephone Encounter (Signed)
 Pt returning call. Please advise.

## 2024-03-31 NOTE — Progress Notes (Signed)
 Date of COVID positive in last 90 days:  PCP - Tinnie Forts, PA Cardiologist - Alm Clay, MD  Chest x-ray - N/A EKG - 08/29/23 Epic Stress Test - 2013 ECHO - 10/31/23 Epic Cardiac Cath -  Pacemaker/ICD device last checked:N/A Spinal Cord Stimulator:N/A  Bowel Prep - N/A  Sleep Study - N/A CPAP -   Fasting Blood Sugar - N/A Checks Blood Sugar _____ times a day  Last dose of GLP1 agonist-  N/A GLP1 instructions:  Do not take after     Last dose of SGLT-2 inhibitors-  N/A SGLT-2 instructions:  Do not take after     Blood Thinner Instructions: Plavix  Aspirin  Instructions:N/A Last Dose:  Activity level:  Can go up a flight of stairs and perform activities of daily living without stopping and without symptoms of chest pain or shortness of breath.  Able to exercise without symptoms  Unable to go up a flight of stairs without symptoms of     Anesthesia review: CABG x3, HTN, CAD, CHF, NSTEMI, anemia, DM  Patient denies shortness of breath, fever, cough and chest pain at PAT appointment  Patient verbalized understanding of instructions that were given to them at the PAT appointment. Patient was also instructed that they will need to review over the PAT instructions again at home before surgery.

## 2024-03-31 NOTE — Telephone Encounter (Signed)
° °  Name: Eric Bradshaw  DOB: 1955-11-30  MRN: [id number]  Primary Cardiologist: Alm Clay, MD   Preoperative team, please contact this patient and set up ASAP phone call appointment for further preoperative risk assessment. Please obtain consent and complete medication review. Thank you for your help. Sent secure chat to team helping to facilitate given surgical date, would need to begin holding Plavix  starting tomorrow.  Regarding Plavix , in a prior note dated 02/2022, Dr. Clay gave authorization to hold Plavix  5-7 days preop for surgery if needed. Given no interim PCI, the recommendation is still valid.  I also confirmed the patient resides in the state of [place] . As per [facility] Medical Board telemedicine laws, the patient must reside in the state in which the provider is licensed.   Raphael LOISE Bring, PA-C 03/31/2024, 1:32 PM [facility]

## 2024-03-31 NOTE — Telephone Encounter (Signed)
 1st attempt to reach pt regarding surgical clearance and the need for an TELE appointment.  Left pt a detailed message to call back and get that scheduled.

## 2024-03-31 NOTE — Telephone Encounter (Signed)
° ° °  Pre-operative Risk Assessment    Patient Name: Eric Bradshaw  DOB: 07/20/1955 MRN: [id number]      Request for Surgical Clearance    Procedure:  Cystoscopy with biopsy and transurethral resection of bladder tumor  Date of Surgery:  Clearance 04/06/24                                 Surgeon:  Dr Ronal Leeroy Shank Surgeon's Group or Practice Name:  [facility] Phone number:  [phone] Fax number:  [phone]   Type of Clearance Requested:  Both, asking for patient to hold plavix  5 days prior to surgery    Type of Anesthesia:  General    Additional requests/questions:  Please advise surgeon/provider what medications should be held.  Bonney Frederik Suzen GORMAN   03/31/2024, 12:38 PM

## 2024-03-31 NOTE — Telephone Encounter (Signed)
°  Patient Consent for Virtual Visit        Eric Bradshaw has provided verbal consent on 03/31/2024 for a virtual visit (video or telephone).   CONSENT FOR VIRTUAL VISIT FOR:  Eric Bradshaw  By participating in this virtual visit I agree to the following:  I hereby voluntarily request, consent and authorize [facility] and its employed or contracted physicians, physician assistants, nurse practitioners or other licensed health care professionals (the Practitioner), to provide me with telemedicine health care services (the Services) as deemed necessary by the treating Practitioner. I acknowledge and consent to receive the Services by the Practitioner via telemedicine. I understand that the telemedicine visit will involve communicating with the Practitioner through live audiovisual communication technology and the disclosure of certain medical information by electronic transmission. I acknowledge that I have been given the opportunity to request an in-person assessment or other available alternative prior to the telemedicine visit and am voluntarily participating in the telemedicine visit.  I understand that I have the right to withhold or withdraw my consent to the use of telemedicine in the course of my care at any time, without affecting my right to future care or treatment, and that the Practitioner or I may terminate the telemedicine visit at any time. I understand that I have the right to inspect all information obtained and/or recorded in the course of the telemedicine visit and may receive copies of available information for a reasonable fee.  I understand that some of the potential risks of receiving the Services via telemedicine include:  Delay or interruption in medical evaluation due to technological equipment failure or disruption; Information transmitted may not be sufficient (e.g. poor resolution of images) to allow for appropriate medical decision making by the  Practitioner; and/or  In rare instances, security protocols could fail, causing a breach of personal health information.  Furthermore, I acknowledge that it is my responsibility to provide information about my medical history, conditions and care that is complete and accurate to the best of my ability. I acknowledge that Practitioner's advice, recommendations, and/or decision may be based on factors not within their control, such as incomplete or inaccurate data provided by me or distortions of diagnostic images or specimens that may result from electronic transmissions. I understand that the practice of medicine is not an exact science and that Practitioner makes no warranties or guarantees regarding treatment outcomes. I acknowledge that a copy of this consent can be made available to me via my patient portal [facility] MyChart), or I can request a printed copy by calling the office of [facility].    I understand that my insurance will be billed for this visit.   I have read or had this consent read to me. I understand the contents of this consent, which adequately explains the benefits and risks of the Services being provided via telemedicine.  I have been provided ample opportunity to ask questions regarding this consent and the Services and have had my questions answered to my satisfaction. I give my informed consent for the services to be provided through the use of telemedicine in my medical care

## 2024-03-31 NOTE — Telephone Encounter (Signed)
 Preop tele appt now scheduled, med rec and consent done.

## 2024-03-31 NOTE — Progress Notes (Signed)
 Virtual Visit via Telephone Note   Because of Eric Bradshaw co-morbid illnesses, he is at least at moderate risk for complications without adequate follow up.  This format is felt to be most appropriate for this patient at this time.  Due to technical limitations with video connection (technology), today's appointment will be conducted as an audio only telehealth visit, and Eric Bradshaw verbally agreed to proceed in this manner.   All issues noted in this document were discussed and addressed.  No physical exam could be performed with this format.  Evaluation Performed:  Preoperative cardiovascular risk assessment _____________   Date:  03/31/2024   Patient ID:  Eric Bradshaw, DOB 03/27/1956, MRN [id number] Patient Location:  Home Provider location:   Office  Primary Care Provider:  Evangelina Tinnie Norris, PA-C Primary Cardiologist:  Alm Clay, MD  Chief Complaint / Patient Profile   68 y.o. 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  History of Present Illness    Eric Bradshaw is a 68 y.o. male who presents via audio/video conferencing for a telehealth visit today.  Pt was last seen in cardiology clinic on 08/29/2023 by Dr. Clay.  At that time Eric Bradshaw was doing well.  The patient is now pending procedure as outlined above. Since his last visit, he no SOB, CP, LE edema. He drinks bourbon nightly.   Regarding Plavix , in a prior note dated 02/2022, Dr. Clay gave authorization to hold Plavix  5-7 days preop for surgery if needed.  Given no interim PCI, the recommendation is still valid.   Past Medical History    Past Medical History:  Diagnosis Date   Anemia    CAD S/P percutaneous coronary angioplasty 03/2010 -- 3-07/2011   Proximal LAD ~80% (into D2) & mid - subtotal occlusion , RCA occlusion, LCx occlusion -- s/p CABG x 3  (LIMA-LAD, SVG-OM, SVG-RPDA); Cath 06/2011, patent grafts with severe proximal  LAD - D1 lesions -- 07/2011: PCI  proxLAD-into D2 with Promus Premier DES 2.5 mm x 38 mm (distal in D2 2.5 mm, @ bifurcation - 2.75 m, in prox LAD ~3.0 mm   Cancer (HCC)    bladder   Carotid artery disease    moderate by 2017 duplex US    CHF (congestive heart failure) (HCC)    Chronic combined systolic and diastolic HF (heart failure), NYHA class 2 (HCC) 03/2010; 08/2103   a) EF ~30-30%; Exertional Dyspnea; No exaccerbations.;; b)EF 35-40%, Mod LA dilation   Depression    DM (diabetes mellitus) type II controlled peripheral vascular disorder 04/16/2011   & CAD   pt. denies   GERD (gastroesophageal reflux disease)    History of kidney stones    HTN, goal below 130/80 03/15/2010   Hyperlipidemia LDL goal < 70    Ischemic cardiomyopathy 03/2010; May 2015   a) Echo - EF 30-40%; mild/mod anterior wall hypokinesis; doppler flow suggestive of impaired LV relaxation; LA moderately dilated; annuloplasty ring noted in mitral position; mild/mod mitral regurgitation; RVsystolic pressure elevated at [id number];;; b) Echo May 2015: EF 35-40%   NSTEMI (non-ST elevated myocardial infarction) (HCC) 03/15/2010  Complicated by Severe Acute Systolic CHF -- Class IV CHF,   S/P CABG x 3 03/15/2010   LIMA-mLAD, SVG-OM, SVG-rPDA   S/P MVR (mitral valve repair) 03/15/2010   @ time of CABG for ischemic MR - 26 mm Edwards physio-2 annuloplasty ring   Tobacco dependence    Initially quit - restarted ~mid 2012;   Past Surgical History:  Procedure Laterality Date   CATARACT EXTRACTION W/ INTRAOCULAR LENS IMPLANT Right    CORONARY ARTERY  BYPASS GRAFT  03/15/2010   LIMA-LAD, SVG-OM, SVG-RPDA   CYSTOSCOPY N/A 08/14/2021   Procedure: CYSTOSCOPY;  Surgeon: Elisabeth Valli BIRCH, MD;  Location: [facility];  Service: Urology;  Laterality: N/A;   EYE SURGERY     cataract   FULGURATION OF BLADDER TUMOR N/A 02/22/2021   Procedure: CYSTOSCOPY/CLOT EVACTION/FULGURATION OF BLADDER TUMOR;  Surgeon: Elisabeth Valli BIRCH, MD;  Location: [facility];  Service: Urology;  Laterality: N/A;   Left Arm Surgery - NOS     LEFT HEART CATHETERIZATION WITH CORONARY/GRAFT ANGIOGRAM  07/03/2011   Procedure: LEFT HEART CATHETERIZATION WITH EL BILE;  Surgeon: Alm LELON Clay, MD;  Location: [facility] CATH LAB;  Service: Cardiovascular;Patent LIMA-LAD, SVG-OM, SVG-RPDA; (native RCA & OM prox 100%, prox LAD into major D2 ~80% long lesion, midLAD 100%   MITRAL VALVULOPLASTY  03/15/2010   conjuntive with CABG for Severe MR; 26 mm Edwards physio-2 annuloplasty ring   NM MYOVIEW  LTD  04/24/2011   R/L MV - EF 37%; mild perfusion defect due to attenuation w/mild to mod superimposed ischemia seen in apex, apical lateral and distal to mid ateroseptal region; LV systolic fcn moderately reduced; hypotensive BP response to stress w/o chest pain; high risk scan   PERCUTANEOUS CORONARY STENT INTERVENTION (PCI-S) N/A 08/02/2011   Procedure: PERCUTANEOUS CORONARY STENT INTERVENTION (PCI-S);  Surgeon: Alm LELON Clay, MD;  Location: [facility] CATH LAB;  Service: Cardiovascular;  PCI proximal LAD-D2: Promus Premier DES 2.5 x 38 (tapered post-dilation from 3.0 mm in proximal LAD, 2.75 mm at bifurcation and 2.5 mm in D2)   personal history of chemo     TONSILLECTOMY     Childhood   TRANSTHORACIC ECHOCARDIOGRAM  03/23/2010   Pre-CABG/MVR: EF 25-30%; global HK; Mod-Severe MR   TRANSTHORACIC ECHOCARDIOGRAM  09/2011; 08/2013   a) EF up to 30-40% (post PCI); mild-mod Ant wall HK; Gd 1 DD; MV Annuloplasty ring in place, mild-mod MR; mild PHTN (30-40 mmHg);; b)  EF 35-40% with moderately reduced  function. Moderately dilated LV with distal septal-apical, mid and basal inferior akinesis; status post mitral valve ring. Significant diastolic gradient with mild MR. Moderate LA dilation.     TRANSURETHRAL RESECTION OF BLADDER TUMOR N/A 01/30/2021   Procedure: RESTAGING TRANSURETHRAL RESECTION OF BLADDER TUMOR (TURBT);  Surgeon: Elisabeth Valli BIRCH, MD;  Location: [facility];  Service: Urology;  Laterality: N/A;  1 HR   TRANSURETHRAL RESECTION OF BLADDER TUMOR N/A 02/20/2021   Procedure: TRANSURETHRAL RESECTION OF BLADDER TUMOR (TURBT), RESTAGING;  Surgeon: Elisabeth Valli BIRCH, MD;  Location: [facility];  Service: Urology;  Laterality: N/A;  GENERAL ANESTHESIA WITH PARALYSIS   TRANSURETHRAL RESECTION OF BLADDER TUMOR N/A 08/14/2021   Procedure: TRANSURETHRAL RESECTION OF BLADDER TUMOR;  Surgeon: Elisabeth Valli BIRCH, MD;  Location: [facility];  Service: Urology;  Laterality: N/A;  75 MINS   TRANSURETHRAL RESECTION OF BLADDER TUMOR N/A 02/12/2022   Procedure: TRANSURETHRAL RESECTION OF BLADDER TUMOR (TURBT);  Surgeon: Elisabeth Valli BIRCH, MD;  Location: [facility];  Service: Urology;  Laterality: N/A;  1  HR   TRANSURETHRAL RESECTION OF BLADDER TUMOR N/A 07/16/2022   Procedure: TRANSURETHRAL RESECTION OF BLADDER TUMOR (TURBT);  Surgeon: Elisabeth Valli BIRCH, MD;  Location: [facility];  Service: Urology;  Laterality: N/A;  60 MINUTES   TRANSURETHRAL RESECTION OF BLADDER TUMOR WITH MITOMYCIN -C N/A 12/26/2020   Procedure: TRANSURETHRAL RESECTION OF BLADDER TUMOR WITH GEMCITABINE ;  Surgeon: Elisabeth Valli BIRCH, MD;  Location: [facility];  Service: Urology;  Laterality: N/A;    Allergies  Allergies[1]  Home Medications    Prior to Admission medications  Medication Sig Start Date End Date Taking? Authorizing Provider  Ascorbic Acid  (VITAMIN C ) 1000 MG tablet Take 1,000 mg by mouth in the morning.    [provider]  B Complex-C (SUPER B COMPLEX PO) Take 1 capsule by mouth daily.    [provider]  carvedilol  (COREG )  12.5 MG tablet TAKE 1/2 TABLET BY MOUTH EVERY MORNING AND 1 TABLET AT NIGHT 10/31/22   Anner Alm ORN, MD  Cholecalciferol  (VITAMIN D-3) 125 MCG (5000 UT) TABS Take 5,000 Units by mouth in the morning.    [provider]  clopidogrel  (PLAVIX ) 75 MG tablet Take 75 mg by mouth daily. 06/29/21   [provider]  Echinacea 400 MG CAPS Take 400 mg by mouth in the morning.    [provider]  Garlic (GARLIQUE PO) Take 1 tablet by mouth in the morning.    [provider]  glimepiride  (AMARYL ) 4 MG tablet Take 4 mg by mouth 2 (two) times daily. 01/21/20   [provider]  ibuprofen (ADVIL) 200 MG tablet Take 200-400 mg by mouth every 6 (six) hours as needed for moderate pain.    [provider]  JARDIANCE  25 MG TABS tablet Take 25 mg by mouth in the morning. 03/23/20   [provider]  Magnesium  500 MG TABS Take 500 mg by mouth in the morning.    [provider]  Multiple Vitamins-Minerals (MULTIVITAMINS THER. W/MINERALS) TABS Take 1 tablet by mouth daily.    [provider]  Omega-3 Fatty Acids (FISH OIL) 1000 MG CAPS Take 1,000 mg by mouth in the morning.    [provider]  rosuvastatin  (CRESTOR ) 40 MG tablet Take 1 tablet (40 mg total) by mouth daily. Patient taking differently: Take 40 mg by mouth every evening. 03/18/19 03/31/24  Anner Alm ORN, MD  RYBELSUS 7 MG TABS Take 7 mg by mouth daily. 05/03/22   [provider]  SPIRIVA HANDIHALER 18 MCG inhalation capsule Place 18 mcg into inhaler and inhale daily.    [provider]  Turmeric 500 MG CAPS Take 500 mg by mouth daily.    [provider]  valsartan  (DIOVAN ) 80 MG tablet TAKE 1 TABLET(80 MG) BY MOUTH DAILY 10/06/23   Anner Alm ORN, MD  vitamin E 1000 UNIT capsule Take 1,000 Units by mouth daily.    [provider]  Zinc 50 MG TABS Take 50 mg by mouth in the morning.    [provider]    Physical Exam     Vital Signs:  Eric Bradshaw does not have vital signs available for review today.  Given telephonic nature of communication, physical exam is limited. AAOx3. NAD. Normal affect.  Speech and respirations are unlabored.  Accessory Clinical Findings    None  Assessment & Plan    1.  Preoperative Cardiovascular Risk Assessment:  Eric Bradshaw perioperative risk of a major cardiac event is 6.6% according to the Revised Cardiac  Risk Index (RCRI).  Therefore, he is at high risk for perioperative complications.   His functional capacity is good at 5.07 METs according to the Duke Activity Status Index (DASI). Recommendations: According to ACC/AHA guidelines, no further cardiovascular testing needed.  The patient may proceed to surgery at acceptable risk.   Antiplatelet and/or Anticoagulation Recommendations: Clopidogrel  (Plavix ) can be held for 5-7 days prior to his surgery and resumed as soon as possible post op.  The patient was advised that if he develops new symptoms prior to surgery to contact our office to arrange for a follow-up visit, and he verbalized understanding.   A copy of this note will be routed to requesting surgeon.  Time:   Today, I have spent 8 minutes with the patient with telehealth technology discussing medical history, symptoms, and management plan.     Eric LOISE Fabry, PA-C  03/31/2024, 3:42 PM      [1]  Allergies Allergen Reactions   Morphine  And Codeine Other (See Comments)    HEADACHE

## 2024-04-01 ENCOUNTER — Encounter (HOSPITAL_COMMUNITY): Payer: Self-pay

## 2024-04-01 ENCOUNTER — Other Ambulatory Visit: Payer: Self-pay

## 2024-04-01 ENCOUNTER — Encounter (HOSPITAL_COMMUNITY)
Admission: RE | Admit: 2024-04-01 | Discharge: 2024-04-01 | Disposition: A | Discharge status: Home / Self Care | Source: Ambulatory Visit | Attending: Urology | Admitting: Urology

## 2024-04-01 VITALS — BP 145/77 | HR 81 | Temp 98.7°F | Resp 18 | Ht 67.0 in

## 2024-04-01 DIAGNOSIS — C678 Malignant neoplasm of overlapping sites of bladder: Secondary | ICD-10-CM | POA: Insufficient documentation

## 2024-04-01 DIAGNOSIS — Z7984 Long term (current) use of oral hypoglycemic drugs: Secondary | ICD-10-CM | POA: Diagnosis not present

## 2024-04-01 DIAGNOSIS — I2510 Atherosclerotic heart disease of native coronary artery without angina pectoris: Secondary | ICD-10-CM | POA: Insufficient documentation

## 2024-04-01 DIAGNOSIS — Z955 Presence of coronary angioplasty implant and graft: Secondary | ICD-10-CM | POA: Insufficient documentation

## 2024-04-01 DIAGNOSIS — I080 Rheumatic disorders of both mitral and aortic valves: Secondary | ICD-10-CM | POA: Insufficient documentation

## 2024-04-01 DIAGNOSIS — Z01812 Encounter for preprocedural laboratory examination: Secondary | ICD-10-CM | POA: Diagnosis present

## 2024-04-01 DIAGNOSIS — I5042 Chronic combined systolic (congestive) and diastolic (congestive) heart failure: Secondary | ICD-10-CM | POA: Insufficient documentation

## 2024-04-01 DIAGNOSIS — I255 Ischemic cardiomyopathy: Secondary | ICD-10-CM | POA: Diagnosis not present

## 2024-04-01 DIAGNOSIS — I110 Hypertensive heart disease with heart failure: Secondary | ICD-10-CM | POA: Insufficient documentation

## 2024-04-01 DIAGNOSIS — E1151 Type 2 diabetes mellitus with diabetic peripheral angiopathy without gangrene: Secondary | ICD-10-CM | POA: Insufficient documentation

## 2024-04-01 DIAGNOSIS — E119 Type 2 diabetes mellitus without complications: Secondary | ICD-10-CM

## 2024-04-01 DIAGNOSIS — Z951 Presence of aortocoronary bypass graft: Secondary | ICD-10-CM | POA: Diagnosis not present

## 2024-04-01 DIAGNOSIS — I252 Old myocardial infarction: Secondary | ICD-10-CM | POA: Diagnosis not present

## 2024-04-01 DIAGNOSIS — J449 Chronic obstructive pulmonary disease, unspecified: Secondary | ICD-10-CM | POA: Insufficient documentation

## 2024-04-01 DIAGNOSIS — Z8551 Personal history of malignant neoplasm of bladder: Secondary | ICD-10-CM | POA: Diagnosis not present

## 2024-04-01 DIAGNOSIS — F17290 Nicotine dependence, other tobacco product, uncomplicated: Secondary | ICD-10-CM | POA: Diagnosis not present

## 2024-04-01 LAB — BASIC METABOLIC PANEL WITH GFR
Anion gap: 10 (ref 5–15)
BUN: 25 mg/dL — ABNORMAL HIGH (ref 8–23)
CO2: 23 mmol/L (ref 22–32)
Calcium: 9.7 mg/dL (ref 8.9–10.3)
Chloride: 106 mmol/L (ref 98–111)
Creatinine, Ser: 0.79 mg/dL (ref 0.61–1.24)
GFR, Estimated: >60 mL/min (ref >60)
Glucose, Bld: 159 mg/dL — ABNORMAL HIGH (ref 70–99)
Potassium: 4.8 mmol/L (ref 3.5–5.1)
Sodium: 139 mmol/L (ref 135–145)

## 2024-04-01 LAB — CBC
HCT: 42.6 % (ref 39.0–52.0)
Hemoglobin: 14.4 g/dL (ref 13.0–17.0)
MCH: 31.5 pg (ref 26.0–34.0)
MCHC: 33.8 g/dL (ref 30.0–36.0)
MCV: 93.2 fL (ref 80.0–100.0)
Platelets: 187 K/uL (ref 150–400)
RBC: 4.57 MIL/uL (ref 4.22–5.81)
RDW: 12.6 % (ref 11.5–15.5)
WBC: 9.4 K/uL (ref 4.0–10.5)
nRBC: 0 % (ref 0.0–0.2)

## 2024-04-01 LAB — HEMOGLOBIN A1C
Hgb A1c MFr Bld: 7.3 % — ABNORMAL HIGH (ref 4.8–5.6)
Mean Plasma Glucose: 162.81 mg/dL

## 2024-04-01 LAB — GLUCOSE, CAPILLARY: Glucose-Capillary: 140 mg/dL — ABNORMAL HIGH (ref 70–99)

## 2024-04-01 NOTE — Patient Instructions (Addendum)
 SURGICAL WAITING ROOM VISITATION  Patients having surgery or a procedure may have no more than 2 support people in the waiting area - these visitors may rotate.    Children ages 35 and under will not be able to visit patients in [facility] under most circumstances.   Visitors with respiratory illnesses are discouraged from visiting and should remain at home.  If the patient needs to stay at the hospital during part of their recovery, the visitor guidelines for inpatient rooms apply. Pre-op nurse will coordinate an appropriate time for 1 support person to accompany patient in pre-op.  This support person may not rotate.    Please refer to the [facility] website for the visitor guidelines for Inpatients (after your surgery is over and you are in a regular room).    Your procedure is scheduled on: 04/06/24   Report to [facility] Main Entrance    Report to admitting at 10:45 AM   Call this number if you have problems the morning of surgery [phone]   Do not eat food or drink liquids :After Midnight.          If you have questions, please contact your surgeons office.   FOLLOW BOWEL PREP AND ANY ADDITIONAL PRE OP INSTRUCTIONS YOU RECEIVED FROM YOUR SURGEON'S OFFICE!!!     Oral Hygiene is also important to reduce your risk of infection.                                    Remember - BRUSH YOUR TEETH THE MORNING OF SURGERY WITH YOUR REGULAR TOOTHPASTE  DENTURES WILL BE REMOVED PRIOR TO SURGERY PLEASE DO NOT APPLY Poly grip OR ADHESIVES!!!   Do NOT smoke after Midnight   Stop all vitamins and herbal supplements 7 days before surgery.   Take these medicines the morning of surgery with A SIP OF WATER : Carvedilol , Inhaler   DO NOT TAKE ANY ORAL DIABETIC MEDICATIONS DAY OF YOUR SURGERY  How to Manage Your Diabetes Before and After Surgery  Why is it important to control my blood sugar before and after surgery? Improving blood sugar levels before and after  surgery helps healing and can limit problems. A way of improving blood sugar control is eating a healthy diet by:  Eating less sugar and carbohydrates  Increasing activity/exercise  Talking with your doctor about reaching your blood sugar goals High blood sugars (greater than 180 mg/dL) can raise your risk of infections and slow your recovery, so you will need to focus on controlling your diabetes during the weeks before surgery. Make sure that the doctor who takes care of your diabetes knows about your planned surgery including the date and location.  How do I manage my blood sugar before surgery? Check your blood sugar at least 4 times a day, starting 2 days before surgery, to make sure that the level is not too high or low. Check your blood sugar the morning of your surgery when you wake up and every 2 hours until you get to the Short Stay unit. If your blood sugar is less than 70 mg/dL, you will need to treat for low blood sugar: Do not take insulin . Treat a low blood sugar (less than 70 mg/dL) with  cup of clear juice (cranberry or apple), 4 glucose tablets, OR glucose gel. Recheck blood sugar in 15 minutes after treatment (to make sure it is greater than 70  mg/dL). If your blood sugar is not greater than 70 mg/dL on recheck, call [phone] for further instructions. Report your blood sugar to the short stay nurse when you get to Short Stay.  If you are admitted to the hospital after surgery: Your blood sugar will be checked by the staff and you will probably be given insulin  after surgery (instead of oral diabetes medicines) to make sure you have good blood sugar levels. The goal for blood sugar control after surgery is 80-180 mg/dL.   WHAT DO I DO ABOUT MY DIABETES MEDICATION?  Do not take oral diabetes medicines (pills) the morning of surgery.  Hold Jardiance  3 days. Do not take after 04/02/24.  THE DAY BEFORE SURGERY, take do not take Rybelsus. Take only morning dose of  Glimepiride , no afternoon or evening dose.      THE MORNING OF SURGERY, do not take Rybelsus or Glimepiride .   Reviewed and Endorsed by [facility] Patient Education Committee, August 2015                              You may not have any metal on your body including jewelry, and body piercing             Do not wear lotions, powders, cologne, or deodorant              Men may shave face and neck.   Do not bring valuables to the hospital. [facility] IS NOT             RESPONSIBLE   FOR VALUABLES.   Contacts, glasses, dentures or bridgework may not be worn into surgery.  DO NOT BRING YOUR HOME MEDICATIONS TO THE HOSPITAL. PHARMACY WILL DISPENSE MEDICATIONS LISTED ON YOUR MEDICATION LIST TO YOU DURING YOUR ADMISSION IN THE HOSPITAL!    Patients discharged on the day of surgery will not be allowed to drive home.  Someone NEEDS to stay with you for the first 24 hours after anesthesia.              Please read over the following fact sheets you were given: IF YOU HAVE QUESTIONS ABOUT YOUR PRE-OP INSTRUCTIONS PLEASE CALL [phone] [place].   If you received a COVID test during your pre-op visit  it is requested that you wear a mask when out in public, stay away from anyone that may not be feeling well and notify your surgeon if you develop symptoms. If you test positive for Covid or have been in contact with anyone that has tested positive in the last 10 days please notify you surgeon.     - Preparing for Surgery Before surgery, you can play an important role.  Because skin is not sterile, your skin needs to be as free of germs as possible.  You can reduce the number of germs on your skin by washing with CHG (chlorahexidine gluconate) soap before surgery.  CHG is an antiseptic cleaner which kills germs and bonds with the skin to continue killing germs even after washing. Please DO NOT use if you have an allergy to CHG or antibacterial soaps.  If your skin becomes  reddened/irritated stop using the CHG and inform your nurse when you arrive at Short Stay. Do not shave (including legs and underarms) for at least 48 hours prior to the first CHG shower.  You may shave your face/neck.  Please follow these instructions carefully:  1.  Shower with  CHG Soap the night before surgery ONLY (DO NOT USE THE SOAP THE MORNING OF SURGERY).  2.  If you choose to wash your hair, wash your hair first as usual with your normal  shampoo.  3.  After you shampoo, rinse your hair and body thoroughly to remove the shampoo.                             4.  Use CHG as you would any other liquid soap.  You can apply chg directly to the skin and wash.  Gently with a scrungie or clean washcloth.  5.  Apply the CHG Soap to your body ONLY FROM THE NECK DOWN.   Do   not use on face/ open                           Wound or open sores. Avoid contact with eyes, ears mouth and   genitals (private parts).                       Wash face,  Genitals (private parts) with your normal soap.             6.  Wash thoroughly, paying special attention to the area where your    surgery  will be performed.  7.  Thoroughly rinse your body with warm water  from the neck down.  8.  DO NOT shower/wash with your normal soap after using and rinsing off the CHG Soap.                9.  Pat yourself dry with a clean towel.            10.  Wear clean pajamas.            11.  Place clean sheets on your bed the night of your first shower and do not  sleep with pets. Day of Surgery : Do not apply any CHG, lotions/deodorants the morning of surgery.  Please wear clean clothes to the hospital/surgery center.  FAILURE TO FOLLOW THESE INSTRUCTIONS MAY RESULT IN THE CANCELLATION OF YOUR SURGERY  PATIENT SIGNATURE_________________________________  NURSE SIGNATURE__________________________________  ________________________________________________________________________

## 2024-04-02 ENCOUNTER — Encounter (HOSPITAL_COMMUNITY): Payer: Self-pay

## 2024-04-02 NOTE — Progress Notes (Addendum)
 " Case: [id number] Date/Time: 04/06/24 1245   Procedures:      TURBT (TRANSURETHRAL RESECTION OF BLADDER TUMOR) - CYSTOSCOPY WITH BIOPSY AND TURBT (TRANSURETHRAL RESECTION OF BLADDER TUMOR)     CYSTOSCOPY, WITH BIOPSY   Anesthesia type: General   Diagnosis: Malignant neoplasm of overlapping sites of bladder (HCC) [C67.8]   Pre-op diagnosis: BLADDER TUMOR   [facility]   Surgeons: Elisabeth Valli BIRCH, MD       DISCUSSION: Eric Bradshaw is a 68 yo male with PMH of current smoking, HTN, hx of NSTEMI (2011), CAD s/p CABG x 3 with MVR (2011) and PCI to LAD-diag (2013), mod-severe mitral stenosis, combined systolic/diastolic CHF, PVD (moderate CAS), Stage 2 COPD, GERD, T2DM (A1c 7.3), anemia, anxiety, depression, moderate ETOH use, bladder cancer s/p multiple TURBT (last was 07/2022).  Patient follows with cardiology for history of NSTEMI s/p CABG x 3 with mitral valve repair in 2011.  Also had PCI to the LAD in 2013.  Has history of ischemic cardiomyopathy with EF of 35 to 40% on last echo in July 2025.  Last seen by Dr. Anner in clinic on 08/29/2023.  She reported fatigue but otherwise had no cardiac issues.  Continues on Plavix  and GDMT.   Per Dr. Anner regarding Echo results: Echocardiogram results shows stable ejection fraction of 35 to 40% with moderately decreased function.  The underside of the heart (inferior) wall does not move, and the left inferolateral wall has reduced function. The repaired mitral valve seems to be working relatively well but there appears to be some narrowing of the prosthetic ring.This needs to be followed intermittently, but otherwise, no real notable changes from past study.  Patient had cardiac clearance tele visit on 03/31/24 and was cleared:   Preoperative Cardiovascular Risk Assessment:   Eric Bradshaw perioperative risk of a major cardiac event is 6.6% according to the Revised Cardiac Risk Index (RCRI).  Therefore, he is at high risk  for perioperative complications.   His functional capacity is good at 5.07 METs according to the [facility] Activity Status Index (DASI). Recommendations: According to ACC/AHA guidelines, no further cardiovascular testing needed.  The patient may proceed to surgery at acceptable risk.   Antiplatelet and/or Anticoagulation Recommendations: Clopidogrel  (Plavix ) can be held for 5-7 days prior to his surgery and resumed as soon as possible post op.  LD Plavix : 12/17 at 9AM   Hx of Stage 2 COPD. On inhalers. No recent exacerbations   VS: BP (!) 145/77   Pulse 81   Temp 37.1 C (Oral)   Resp 18   Ht 5' 7 (1.702 m)   SpO2 99%   BMI 25.56 kg/m   PROVIDERS: Evangelina Tinnie Norris, PA-C Cardiologist - Alm Anner, MD  LABS: Labs reviewed: Acceptable for surgery. (all labs ordered are listed, but only abnormal results are displayed)  Labs Reviewed  HEMOGLOBIN A1C - Abnormal; Notable for the following components:      Result Value   Hgb A1c MFr Bld 7.3 (*)    All other components within normal limits  BASIC METABOLIC PANEL WITH GFR - Abnormal; Notable for the following components:   Glucose, Bld 159 (*)    BUN 25 (*)    All other components within normal limits  GLUCOSE, CAPILLARY - Abnormal; Notable for the following components:   Glucose-Capillary 140 (*)    All other components within normal limits  CBC     IMAGES:   EKG 08/29/2023:  Normal sinus rhythm  Incomplete right bundle block Cannot rule out anterior infarct ST and T wave abnormality, consider lateral ischemia   Echo 10/31/2023:  IMPRESSIONS    1. Left ventricular ejection fraction, by estimation, is 35 to 40%. The left ventricle has moderately decreased function. The left ventricle demonstrates regional wall motion abnormalities (see scoring diagram/findings for description). Left ventricular  diastolic function could not be evaluated. There is akinesis of the left ventricular, basal-mid inferior wall.  There is hypokinesis of the left ventricular, basal-mid inferoseptal wall and inferolateral wall. The average left ventricular global longitudinal strain is -13.0 %. The global longitudinal strain is abnormal.  2. Right ventricular systolic function is low normal. The right ventricular size is normal. There is mildly elevated pulmonary artery systolic pressure.  3. The mitral valve has been repaired/replaced. Mild to moderate mitral valve regurgitation. Moderate to severe mitral stenosis. The mean mitral valve gradient is 15.0 mmHg. There is a unknown size prosthetic annuloplasty ring present in the mitral position. Procedure Date: 2011.  4. The aortic valve is grossly normal. There is mild calcification of the aortic valve. Aortic valve regurgitation is trivial. Aortic valve sclerosis is present, with no evidence of aortic valve stenosis.  5. The inferior vena cava is normal in size with greater than 50% respiratory variability, suggesting right atrial pressure of 3 mmHg.  Comparison(s): No significant change from prior study. Prior images reviewed side by side.  Past Medical History:  Diagnosis Date   Anemia    CAD S/P percutaneous coronary angioplasty 03/2010 -- 3-07/2011   Proximal LAD ~80% (into D2) & mid - subtotal occlusion , RCA occlusion, LCx occlusion -- s/p CABG x 3  (LIMA-LAD, SVG-OM, SVG-RPDA); Cath 06/2011, patent grafts with severe proximal  LAD - D1 lesions -- 07/2011: PCI  proxLAD-into D2 with Promus Premier DES 2.5 mm x 38 mm (distal in D2 2.5 mm, @ bifurcation - 2.75 m, in prox LAD ~3.0 mm   Cancer (HCC)    bladder   Carotid artery disease    moderate by 2017 duplex US    CHF (congestive heart failure) (HCC)    Chronic combined systolic and diastolic HF (heart failure), NYHA class 2 (HCC) 03/2010; 08/2103   a) EF ~30-30%; Exertional Dyspnea; No exaccerbations.;; b)EF 35-40%, Mod LA dilation   Depression    DM (diabetes mellitus) type II controlled peripheral vascular  disorder 04/16/2011   & CAD   pt. denies   GERD (gastroesophageal reflux disease)    History of kidney stones    HTN, goal below 130/80 03/15/2010   Hyperlipidemia LDL goal < 70    Ischemic cardiomyopathy 03/2010; May 2015   a) Echo - EF 30-40%; mild/mod anterior wall hypokinesis; doppler flow suggestive of impaired LV relaxation; LA moderately dilated; annuloplasty ring noted in mitral position; mild/mod mitral regurgitation; RVsystolic pressure elevated at 69-59ffYh;;; b) Echo May 2015: EF 35-40%   NSTEMI (non-ST elevated myocardial infarction) (HCC) 03/15/2010   Complicated by Severe Acute Systolic CHF -- Class IV CHF,   S/P CABG x 3 03/15/2010   LIMA-mLAD, SVG-OM, SVG-rPDA   S/P MVR (mitral valve repair) 03/15/2010   @ time of CABG for ischemic MR - 26 mm Edwards physio-2 annuloplasty ring   Tobacco dependence    Initially quit - restarted ~mid 2012;    Past Surgical History:  Procedure Laterality Date   CATARACT EXTRACTION W/ INTRAOCULAR LENS IMPLANT Right    CORONARY ARTERY BYPASS GRAFT  03/15/2010   LIMA-LAD, SVG-OM, SVG-RPDA   CYSTOSCOPY N/A 08/14/2021  Procedure: CYSTOSCOPY;  Surgeon: Elisabeth Valli BIRCH, MD;  [facility]: [facility];  Service: Urology;  Laterality: N/A;   EYE SURGERY     cataract   FULGURATION OF BLADDER TUMOR N/A 02/22/2021   Procedure: CYSTOSCOPY/CLOT EVACTION/FULGURATION OF BLADDER TUMOR;  Surgeon: Elisabeth Valli BIRCH, MD;  Location[facility];  Service: Urology;  Laterality: N/A;   Left Arm Surgery - NOS     has plates and screws   LEFT HEART CATHETERIZATION WITH CORONARY/GRAFT ANGIOGRAM  07/03/2011   Procedure: LEFT HEART CATHETERIZATION WITH EL BILE;  Surgeon: Alm LELON Clay, MD;  Location: [facility] CATH LAB;  Service: Cardiovascular;Patent LIMA-LAD, SVG-OM, SVG-RPDA; (native RCA & OM prox 100%, prox LAD into major D2 ~80% long lesion, midLAD 100%   MITRAL VALVULOPLASTY  03/15/2010   conjuntive with CABG for Severe MR; 26 mm Edwards physio-2  annuloplasty ring   NM MYOVIEW  LTD  04/24/2011   R/L MV - EF 37%; mild perfusion defect due to attenuation w/mild to mod superimposed ischemia seen in apex, apical lateral and distal to mid ateroseptal region; LV systolic fcn moderately reduced; hypotensive BP response to stress w/o chest pain; high risk scan   PERCUTANEOUS CORONARY STENT INTERVENTION (PCI-S) N/A 08/02/2011   Procedure: PERCUTANEOUS CORONARY STENT INTERVENTION (PCI-S);  Surgeon: Alm LELON Clay, MD;  Location: [facility] CATH LAB;  Service: Cardiovascular;  PCI proximal LAD-D2: Promus Premier DES 2.5 x 38 (tapered post-dilation from 3.0 mm in proximal LAD, 2.75 mm at bifurcation and 2.5 mm in D2)   personal history of chemo     TONSILLECTOMY     Childhood   TRANSTHORACIC ECHOCARDIOGRAM  03/23/2010   Pre-CABG/MVR: EF 25-30%; global HK; Mod-Severe MR   TRANSTHORACIC ECHOCARDIOGRAM  09/2011; 08/2013   a) EF up to 30-40% (post PCI); mild-mod Ant wall HK; Gd 1 DD; MV Annuloplasty ring in place, mild-mod MR; mild PHTN (30-40 mmHg);; b)  EF 35-40% with moderately reduced function. Moderately dilated LV with distal septal-apical, mid and basal inferior akinesis; status post mitral valve ring. Significant diastolic gradient with mild MR. Moderate LA dilation.     TRANSURETHRAL RESECTION OF BLADDER TUMOR N/A 01/30/2021   Procedure: RESTAGING TRANSURETHRAL RESECTION OF BLADDER TUMOR (TURBT);  Surgeon: Elisabeth Valli BIRCH, MD;  [facility];  Service: Urology;  Laterality: N/A;  1 HR   TRANSURETHRAL RESECTION OF BLADDER TUMOR N/A 02/20/2021   Procedure: TRANSURETHRAL RESECTION OF BLADDER TUMOR (TURBT), RESTAGING;  Surgeon: Elisabeth Valli BIRCH, MD;  [facility];  Service: Urology;  Laterality: N/A;  GENERAL ANESTHESIA WITH PARALYSIS   TRANSURETHRAL RESECTION OF BLADDER TUMOR N/A 08/14/2021   Procedure: TRANSURETHRAL RESECTION OF BLADDER TUMOR;  Surgeon: Elisabeth Valli BIRCH, MD;  [facility];  Service: Urology;  Laterality: N/A;  75 MINS    TRANSURETHRAL RESECTION OF BLADDER TUMOR N/A 02/12/2022   Procedure: TRANSURETHRAL RESECTION OF BLADDER TUMOR (TURBT);  Surgeon: Elisabeth Valli BIRCH, MD;  [facility];  Service: Urology;  Laterality: N/A;  1 HR   TRANSURETHRAL RESECTION OF BLADDER TUMOR N/A 07/16/2022   Procedure: TRANSURETHRAL RESECTION OF BLADDER TUMOR (TURBT);  Surgeon: Elisabeth Valli BIRCH, MD;  [facility];  Service: Urology;  Laterality: N/A;  60 MINUTES   TRANSURETHRAL RESECTION OF BLADDER TUMOR WITH MITOMYCIN -C N/A 12/26/2020   Procedure: TRANSURETHRAL RESECTION OF BLADDER TUMOR WITH GEMCITABINE ;  Surgeon: Elisabeth Valli BIRCH, MD;  Location[facility];  Service: Urology;  Laterality: N/A;    MEDICATIONS:  Ascorbic Acid  (VITAMIN C ) 1000 MG tablet   B Complex-C (SUPER  B COMPLEX PO)   carvedilol  (COREG ) 12.5 MG tablet   Cholecalciferol  (VITAMIN D-3) 125 MCG (5000 UT) TABS   clopidogrel  (PLAVIX ) 75 MG tablet   Echinacea 400 MG CAPS   Garlic (GARLIQUE PO)   glimepiride  (AMARYL ) 4 MG tablet   ibuprofen (ADVIL) 200 MG tablet   JARDIANCE  25 MG TABS tablet   Magnesium  500 MG TABS   Multiple Vitamins-Minerals (MULTIVITAMINS THER. W/MINERALS) TABS   Omega-3 Fatty Acids (FISH OIL) 1000 MG CAPS   rosuvastatin  (CRESTOR ) 40 MG tablet   RYBELSUS 7 MG TABS   SPIRIVA HANDIHALER 18 MCG inhalation capsule   Turmeric 500 MG CAPS   valsartan  (DIOVAN ) 80 MG tablet   vitamin E 1000 UNIT capsule   Zinc 50 MG TABS   No current facility-administered medications for this encounter.   Burnard CHRISTELLA Odis DEVONNA [facility] Short Stay/Anesthesiology [facility] Phone [phone] 04/02/2024 10:37 AM       "

## 2024-04-02 NOTE — Anesthesia Preprocedure Evaluation (Addendum)
"                                    Anesthesia Evaluation    Airway        Dental   Pulmonary Current Smoker and Patient abstained from smoking.          Cardiovascular hypertension,      Neuro/Psych    GI/Hepatic   Endo/Other  diabetes    Renal/GU      Musculoskeletal   Abdominal   Peds  Hematology   Anesthesia Other Findings   Reproductive/Obstetrics                              Anesthesia Physical Anesthesia Plan  ASA:   Anesthesia Plan:    Post-op Pain Management:    Induction:   PONV Risk Score and Plan:   Airway Management Planned:   Additional Equipment:   Intra-op Plan:   Post-operative Plan:   Informed Consent:   Plan Discussed with:   Anesthesia Plan Comments: (See PAT note from 12/18 )         Anesthesia Quick Evaluation  "

## 2024-04-06 ENCOUNTER — Encounter (HOSPITAL_COMMUNITY): Admission: RE | Payer: Self-pay | Source: Home / Self Care

## 2024-04-06 ENCOUNTER — Ambulatory Visit (HOSPITAL_COMMUNITY)
Admission: RE | Admit: 2024-04-06 | Discharge: 2024-04-06 | Disposition: A | Discharge status: Home / Self Care | Attending: Urology | Admitting: Urology

## 2024-04-06 ENCOUNTER — Other Ambulatory Visit: Payer: Self-pay

## 2024-04-06 ENCOUNTER — Encounter (HOSPITAL_COMMUNITY): Payer: Self-pay | Admitting: Urology

## 2024-04-06 ENCOUNTER — Ambulatory Visit (HOSPITAL_COMMUNITY): Admitting: Anesthesiology

## 2024-04-06 ENCOUNTER — Encounter (HOSPITAL_COMMUNITY): Payer: Self-pay | Admitting: Medical

## 2024-04-06 DIAGNOSIS — I110 Hypertensive heart disease with heart failure: Secondary | ICD-10-CM

## 2024-04-06 DIAGNOSIS — C678 Malignant neoplasm of overlapping sites of bladder: Secondary | ICD-10-CM | POA: Diagnosis present

## 2024-04-06 DIAGNOSIS — I509 Heart failure, unspecified: Secondary | ICD-10-CM | POA: Diagnosis not present

## 2024-04-06 DIAGNOSIS — I2510 Atherosclerotic heart disease of native coronary artery without angina pectoris: Secondary | ICD-10-CM

## 2024-04-06 DIAGNOSIS — Z955 Presence of coronary angioplasty implant and graft: Secondary | ICD-10-CM | POA: Insufficient documentation

## 2024-04-06 DIAGNOSIS — Z951 Presence of aortocoronary bypass graft: Secondary | ICD-10-CM | POA: Insufficient documentation

## 2024-04-06 DIAGNOSIS — E1151 Type 2 diabetes mellitus with diabetic peripheral angiopathy without gangrene: Secondary | ICD-10-CM | POA: Diagnosis not present

## 2024-04-06 DIAGNOSIS — I272 Pulmonary hypertension, unspecified: Secondary | ICD-10-CM | POA: Insufficient documentation

## 2024-04-06 DIAGNOSIS — Z952 Presence of prosthetic heart valve: Secondary | ICD-10-CM | POA: Insufficient documentation

## 2024-04-06 DIAGNOSIS — F17210 Nicotine dependence, cigarettes, uncomplicated: Secondary | ICD-10-CM | POA: Insufficient documentation

## 2024-04-06 DIAGNOSIS — I252 Old myocardial infarction: Secondary | ICD-10-CM | POA: Diagnosis not present

## 2024-04-06 DIAGNOSIS — J449 Chronic obstructive pulmonary disease, unspecified: Secondary | ICD-10-CM | POA: Insufficient documentation

## 2024-04-06 DIAGNOSIS — E119 Type 2 diabetes mellitus without complications: Secondary | ICD-10-CM

## 2024-04-06 DIAGNOSIS — Z7984 Long term (current) use of oral hypoglycemic drugs: Secondary | ICD-10-CM | POA: Insufficient documentation

## 2024-04-06 DIAGNOSIS — D494 Neoplasm of unspecified behavior of bladder: Secondary | ICD-10-CM | POA: Diagnosis not present

## 2024-04-06 DIAGNOSIS — Z79899 Other long term (current) drug therapy: Secondary | ICD-10-CM | POA: Diagnosis not present

## 2024-04-06 HISTORY — PX: TRANSURETHRAL RESECTION OF BLADDER TUMOR: SHX2575

## 2024-04-06 HISTORY — PX: CYSTOSCOPY WITH BIOPSY: SHX5122

## 2024-04-06 LAB — GLUCOSE, CAPILLARY
Glucose-Capillary: 239 mg/dL — ABNORMAL HIGH (ref 70–99)
Glucose-Capillary: 190 mg/dL — ABNORMAL HIGH (ref 70–99)

## 2024-04-06 SURGERY — TURBT (TRANSURETHRAL RESECTION OF BLADDER TUMOR)
Anesthesia: General | Site: Bladder

## 2024-04-06 MED ORDER — DROPERIDOL 2.5 MG/ML IJ SOLN: 0.6250 mg | Freq: Once | INTRAMUSCULAR | Status: DC | PRN

## 2024-04-06 MED ORDER — LACTATED RINGERS IV SOLN: INTRAVENOUS | Status: DC

## 2024-04-06 MED ORDER — PROPOFOL 10 MG/ML IV BOLUS
INTRAVENOUS | Status: DC | PRN
  Administered 2024-04-06: 80 mg via INTRAVENOUS

## 2024-04-06 MED ORDER — INSULIN ASPART 100 UNIT/ML IJ SOLN
0.0000 [IU] | INTRAMUSCULAR | Status: DC | PRN
  Administered 2024-04-06: 3 [IU] via SUBCUTANEOUS

## 2024-04-06 MED ORDER — FENTANYL CITRATE (PF) 50 MCG/ML IJ SOSY: 25.0000 ug | PREFILLED_SYRINGE | INTRAMUSCULAR | Status: DC | PRN

## 2024-04-06 MED ORDER — SUGAMMADEX SODIUM 200 MG/2ML IV SOLN
INTRAVENOUS | Status: DC | PRN
  Administered 2024-04-06: 200 mg via INTRAVENOUS

## 2024-04-06 MED ORDER — LIDOCAINE HCL (PF) 2 % IJ SOLN
INTRAMUSCULAR | Status: AC
  Filled 2024-04-06: qty 5

## 2024-04-06 MED ORDER — CEFAZOLIN SODIUM-DEXTROSE 2-4 GM/100ML-% IV SOLN
2.0000 g | Freq: Once | INTRAVENOUS | Status: AC
  Administered 2024-04-06: 2 g via INTRAVENOUS
  Filled 2024-04-06: qty 100

## 2024-04-06 MED ORDER — PROPOFOL 10 MG/ML IV BOLUS
INTRAVENOUS | Status: AC
  Filled 2024-04-06: qty 20

## 2024-04-06 MED ORDER — LIDOCAINE HCL (CARDIAC) PF 100 MG/5ML IV SOSY
PREFILLED_SYRINGE | INTRAVENOUS | Status: DC | PRN
  Administered 2024-04-06: 70 mg via INTRAVENOUS

## 2024-04-06 MED ORDER — EPHEDRINE 5 MG/ML INJ
INTRAVENOUS | Status: AC
  Filled 2024-04-06: qty 5

## 2024-04-06 MED ORDER — EPHEDRINE SULFATE (PRESSORS) 25 MG/5ML IV SOSY
PREFILLED_SYRINGE | INTRAVENOUS | Status: DC | PRN
  Administered 2024-04-06: 5 mg via INTRAVENOUS

## 2024-04-06 MED ORDER — INSULIN ASPART 100 UNIT/ML IJ SOLN
INTRAMUSCULAR | Status: AC
  Filled 2024-04-06: qty 3

## 2024-04-06 MED ORDER — ONDANSETRON HCL 4 MG/2ML IJ SOLN
INTRAMUSCULAR | Status: AC
  Filled 2024-04-06: qty 2

## 2024-04-06 MED ORDER — DEXAMETHASONE SOD PHOSPHATE PF 10 MG/ML IJ SOLN: INTRAMUSCULAR | Status: DC | PRN

## 2024-04-06 MED ORDER — ROCURONIUM BROMIDE 10 MG/ML (PF) SYRINGE
PREFILLED_SYRINGE | INTRAVENOUS | Status: AC
  Filled 2024-04-06: qty 10

## 2024-04-06 MED ORDER — DEXAMETHASONE SOD PHOSPHATE PF 10 MG/ML IJ SOLN
INTRAMUSCULAR | Status: DC | PRN
  Administered 2024-04-06: 4 mg via INTRAVENOUS

## 2024-04-06 MED ORDER — FENTANYL CITRATE (PF) 100 MCG/2ML IJ SOLN
INTRAMUSCULAR | Status: DC | PRN
  Administered 2024-04-06 (×2): 50 ug via INTRAVENOUS

## 2024-04-06 MED ORDER — CHLORHEXIDINE GLUCONATE 0.12 % MT SOLN
15.0000 mL | Freq: Once | OROMUCOSAL | Status: AC
  Administered 2024-04-06: 15 mL via OROMUCOSAL

## 2024-04-06 MED ORDER — MIDAZOLAM HCL 2 MG/2ML IJ SOLN
INTRAMUSCULAR | Status: AC
  Filled 2024-04-06: qty 2

## 2024-04-06 MED ORDER — 0.9 % SODIUM CHLORIDE (POUR BTL) OPTIME: TOPICAL | Status: DC | PRN

## 2024-04-06 MED ORDER — PHENYLEPHRINE 80 MCG/ML (10ML) SYRINGE FOR IV PUSH (FOR BLOOD PRESSURE SUPPORT)
PREFILLED_SYRINGE | INTRAVENOUS | Status: DC | PRN
  Administered 2024-04-06 (×2): 160 ug via INTRAVENOUS
  Administered 2024-04-06: 80 ug via INTRAVENOUS

## 2024-04-06 MED ORDER — ROCURONIUM BROMIDE 10 MG/ML (PF) SYRINGE
PREFILLED_SYRINGE | INTRAVENOUS | Status: DC | PRN
  Administered 2024-04-06: 40 mg via INTRAVENOUS

## 2024-04-06 MED ORDER — ORAL CARE MOUTH RINSE: 15.0000 mL | Freq: Once | OROMUCOSAL | Status: AC

## 2024-04-06 MED ORDER — SUGAMMADEX SODIUM 200 MG/2ML IV SOLN
INTRAVENOUS | Status: AC
  Filled 2024-04-06: qty 2

## 2024-04-06 MED ORDER — ONDANSETRON HCL 4 MG/2ML IJ SOLN
INTRAMUSCULAR | Status: DC | PRN
  Administered 2024-04-06: 4 mg via INTRAVENOUS

## 2024-04-06 MED ORDER — STERILE WATER FOR IRRIGATION IR SOLN
Status: DC | PRN
  Administered 2024-04-06: 3000 mL via INTRAVESICAL

## 2024-04-06 MED ORDER — FENTANYL CITRATE (PF) 100 MCG/2ML IJ SOLN
INTRAMUSCULAR | Status: AC
  Filled 2024-04-06: qty 2

## 2024-04-06 MED ORDER — MIDAZOLAM HCL (PF) 2 MG/2ML IJ SOLN
INTRAMUSCULAR | Status: DC | PRN
  Administered 2024-04-06: 2 mg via INTRAVENOUS

## 2024-04-06 SURGICAL SUPPLY — 15 items
BAG URINE DRAIN 2000ML AR STRL (UROLOGICAL SUPPLIES) IMPLANT
BAG URO CATCHER STRL LF (MISCELLANEOUS) ×2 IMPLANT
CLOTH BEACON ORANGE TIMEOUT ST (SAFETY) ×2 IMPLANT
DRAPE FOOT SWITCH (DRAPES) ×2 IMPLANT
ELECT REM PT RETURN 15FT ADLT (MISCELLANEOUS) ×2 IMPLANT
GLOVE BIO SURGEON STRL SZ 6.5 (GLOVE) ×2 IMPLANT
GOWN STRL REUS W/ TWL LRG LVL3 (GOWN DISPOSABLE) ×2 IMPLANT
KIT TURNOVER KIT A (KITS) ×2 IMPLANT
LOOP CUT BIPOLAR 24F LRG (ELECTROSURGICAL) IMPLANT
MANIFOLD NEPTUNE II (INSTRUMENTS) ×2 IMPLANT
PACK CYSTO (CUSTOM PROCEDURE TRAY) ×2 IMPLANT
PAD PREP 24X48 CUFFED NSTRL (MISCELLANEOUS) ×2 IMPLANT
SYRINGE TOOMEY IRRIG 70ML (MISCELLANEOUS) IMPLANT
TUBING CONNECTING 10 (TUBING) ×2 IMPLANT
TUBING UROLOGY SET (TUBING) ×2 IMPLANT

## 2024-04-06 NOTE — Anesthesia Procedure Notes (Signed)
 Procedure Name: Intubation Date/Time: 04/06/2024 12:01 PM  Performed by: Brandy Almarie BROCKS, CRNAPre-anesthesia Checklist: Patient identified, Emergency Drugs available, Suction available and Patient being monitored Patient Re-evaluated:Patient Re-evaluated prior to induction Oxygen Delivery Method: Circle system utilized Preoxygenation: Pre-oxygenation with 100% oxygen Induction Type: IV induction Ventilation: Mask ventilation without difficulty Laryngoscope Size: Mac and 4 Grade View: Grade II Tube type: Oral Tube size: 7.5 mm Number of attempts: 1 Airway Equipment and Method: Stylet Placement Confirmation: ETT inserted through vocal cords under direct vision, positive ETCO2 and breath sounds checked- equal and bilateral Secured at: 24 cm Tube secured with: Tape Dental Injury: Teeth and Oropharynx as per pre-operative assessment

## 2024-04-06 NOTE — Anesthesia Postprocedure Evaluation (Signed)
"   Anesthesia Post Note  Patient: Eric Bradshaw  Procedure(s) Performed: TURBT (TRANSURETHRAL RESECTION OF BLADDER TUMOR) (Bladder) CYSTOSCOPY, WITH BIOPSY     Patient location during evaluation: PACU Anesthesia Type: General Level of consciousness: sedated and patient cooperative Pain management: pain level controlled Vital Signs Assessment: post-procedure vital signs reviewed and stable Respiratory status: spontaneous breathing Cardiovascular status: stable Anesthetic complications: no   No notable events documented.  Last Vitals:  Vitals:   04/06/24 1315 04/06/24 1323  BP: (!) 108/58   Pulse: 71   Resp: 16   Temp: 36.5 C (!) 36.4 C  SpO2: 95%     Last Pain:  Vitals:   04/06/24 1323  TempSrc:   PainSc: 0-No pain                 [name]      "

## 2024-04-06 NOTE — Interval H&P Note (Signed)
 History and Physical Interval Note:  04/06/2024 11:26 AM  Eric Bradshaw  has presented today for surgery, with the diagnosis of BLADDER TUMOR.  The various methods of treatment have been discussed with the patient and family. After consideration of risks, benefits and other options for treatment, the patient has consented to  Procedures with comments: TURBT (TRANSURETHRAL RESECTION OF BLADDER TUMOR) (N/A) - CYSTOSCOPY WITH BIOPSY AND TURBT (TRANSURETHRAL RESECTION OF BLADDER TUMOR) CYSTOSCOPY, WITH BIOPSY (N/A) as a surgical intervention.  The patient's history has been reviewed, patient examined, no change in status, stable for surgery.  I have reviewed the patient's chart and labs.  Questions were answered to the patient's satisfaction.     Dorene Bruni D Irineo Gaulin

## 2024-04-06 NOTE — Op Note (Signed)
 Operative Note  Preoperative diagnosis:  1.  Bladder cancer  Postoperative diagnosis: 1.  Bladder cancer  Procedure(s): 1.  Cystoscopy with fulguration - 0.5 cm, 1cm, 0.5cm  Surgeon: Valli Shank, MD  Assistants:  None  Anesthesia:  General  Complications:  None  EBL:  minimal   Specimens: 1. none  Drains/Catheters: 1.  none  Intraoperative findings:   Normal anterior urethra Bilateral lateral lobe prostatic hypertrophy Uos are displaced - right UO previously resected and deviated superior and laterally. Duplicated Left Uos.  Indication:  Eric Bradshaw is a 68 y.o. male with a history of bladder cancer. Surveillance cystoscopy concerning for early recurrence.   Description of procedure:  After risks and benefits of the procedure were discussed with the patient, informed consent was obtained.  He was taken to the operating and placed in the supine position.  Anesthesia was induced and antibiotics were administered.  He was then repositioned in the dorsal lithotomy position and prepped and draped in the usual sterile fashion. A time was performed.   A 21 French rigid cystoscope was placed in the meatus and advanced into the bladder under direct visualization.  Findings are noted above.  There are 3 areas concerning for recurrence measuring 0.5 cm, 1 cm and 0.5 cm.  There were located the posterior superior wall, posterior mid wall, and left superior lateral wall.  These areas were fulgurated with a Bugbee.  Care was taken to avoid the ureteral orifices.  Hemostasis was adequate with the irrigant turned off.  The cystoscope was removed.  The patient emerged from anesthesia and was transferred the PACU in stable condition.   Plan:  Follow up 1 week

## 2024-04-06 NOTE — Transfer of Care (Signed)
 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  Last Pain:  Vitals:   04/06/24 1041  TempSrc: Oral  PainSc: 0-No pain      Patients Stated Pain Goal: 5 (04/06/24 1041)  Complications: No notable events documented.

## 2024-04-06 NOTE — Discharge Instructions (Signed)
 Cystoscopy with Fulguration patient instructions  Following a cystoscopy, a catheter (a flexible rubber tube) is sometimes left in place to empty the bladder. This may cause some discomfort or a feeling that you need to urinate. Your doctor determines the period of time that the catheter will be left in place. You may have bloody urine for two to three days (Call your doctor if the amount of bleeding increases or does not subside).  You may pass blood clots in your urine, especially if you had a biopsy. It is not unusual to pass small blood clots and have some bloody urine a couple of weeks after your cystoscopy. Again, call your doctor if the bleeding does not subside. You may have: Dysuria (painful urination) Frequency (urinating often) Urgency (strong desire to urinate)  These symptoms are common especially if medicine is instilled into the bladder or a ureteral stent is placed. Avoiding alcohol and caffeine, such as coffee, tea, and chocolate, may help relieve these symptoms. Drink plenty of water , unless otherwise instructed. Your doctor may also prescribe an antibiotic or other medicine to reduce these symptoms.  Cystoscopy results are available soon after the procedure; biopsy results usually take two to four days. Your doctor will discuss the results of your exam with you. Before you go home, you will be given specific instructions for follow-up care. Special Instructions:   If you are going home with a catheter in place do not take a tub bath until removed by your doctor.   You may resume your normal activities.   Do not drive or operate machinery if you are taking narcotic pain medicine.   Be sure to keep all follow-up appointments with your doctor.   Call Your Doctor If: The catheter is not draining You have severe pain You are unable to urinate You have a fever over 101 You have severe bleeding

## 2024-04-07 ENCOUNTER — Encounter (HOSPITAL_COMMUNITY): Payer: Self-pay | Admitting: Urology

## 2024-04-22 ENCOUNTER — Other Ambulatory Visit: Payer: Self-pay | Admitting: Cardiology
